# Patient Record
Sex: Male | Born: 1945
Health system: Southern US, Community
[De-identification: ages and names within clinical notes are randomized; demographics above are authoritative.]

## PROBLEM LIST (undated history)

## (undated) DIAGNOSIS — E039 Hypothyroidism, unspecified: Secondary | ICD-10-CM

## (undated) DIAGNOSIS — C801 Malignant (primary) neoplasm, unspecified: Secondary | ICD-10-CM

## (undated) DIAGNOSIS — I10 Essential (primary) hypertension: Secondary | ICD-10-CM

## (undated) DIAGNOSIS — Z8669 Personal history of other diseases of the nervous system and sense organs: Secondary | ICD-10-CM

## (undated) DIAGNOSIS — I472 Ventricular tachycardia, unspecified: Secondary | ICD-10-CM

## (undated) DIAGNOSIS — E785 Hyperlipidemia, unspecified: Secondary | ICD-10-CM

## (undated) DIAGNOSIS — Z8639 Personal history of other endocrine, nutritional and metabolic disease: Secondary | ICD-10-CM

## (undated) DIAGNOSIS — J302 Other seasonal allergic rhinitis: Secondary | ICD-10-CM

## (undated) DIAGNOSIS — E669 Obesity, unspecified: Secondary | ICD-10-CM

## (undated) DIAGNOSIS — Z8619 Personal history of other infectious and parasitic diseases: Secondary | ICD-10-CM

## (undated) HISTORY — DX: Hyperlipidemia, unspecified: E78.5

## (undated) HISTORY — DX: Personal history of other diseases of the nervous system and sense organs: Z86.69

## (undated) HISTORY — DX: Obesity, unspecified: E66.9

## (undated) HISTORY — DX: Hypothyroidism, unspecified: E03.9

## (undated) HISTORY — DX: Personal history of other infectious and parasitic diseases: Z86.19

## (undated) HISTORY — DX: Personal history of other endocrine, nutritional and metabolic disease: Z86.39

## (undated) HISTORY — DX: Essential (primary) hypertension: I10

## (undated) HISTORY — PX: CATARACT EXTRACTION: SUR2

## (undated) HISTORY — DX: Other seasonal allergic rhinitis: J30.2

## (undated) SURGERY — Surgical Case
Anesthesia: *Unknown

---
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
## 1967-11-19 HISTORY — PX: TONSILLECTOMY AND ADENOIDECTOMY: SUR1326

## 2006-07-01 ENCOUNTER — Emergency Department: Payer: Self-pay | Admitting: Emergency Medicine

## 2006-07-02 ENCOUNTER — Other Ambulatory Visit: Payer: Self-pay

## 2011-10-29 ENCOUNTER — Encounter: Payer: Self-pay | Admitting: Family Medicine

## 2011-10-29 ENCOUNTER — Ambulatory Visit (INDEPENDENT_AMBULATORY_CARE_PROVIDER_SITE_OTHER): Payer: Medicare Other | Admitting: Family Medicine

## 2011-10-29 VITALS — BP 138/80 | HR 92 | Temp 98.4°F | Ht 69.25 in | Wt 262.5 lb

## 2011-10-29 DIAGNOSIS — Z23 Encounter for immunization: Secondary | ICD-10-CM

## 2011-10-29 DIAGNOSIS — E039 Hypothyroidism, unspecified: Secondary | ICD-10-CM | POA: Insufficient documentation

## 2011-10-29 DIAGNOSIS — I10 Essential (primary) hypertension: Secondary | ICD-10-CM | POA: Insufficient documentation

## 2011-10-29 DIAGNOSIS — E669 Obesity, unspecified: Secondary | ICD-10-CM | POA: Insufficient documentation

## 2011-10-29 DIAGNOSIS — L989 Disorder of the skin and subcutaneous tissue, unspecified: Secondary | ICD-10-CM

## 2011-10-29 NOTE — Assessment & Plan Note (Signed)
Recommend increase activity- restart daily 1 mi walks with dog. Body mass index is 38.49 kg/(m^2).

## 2011-10-29 NOTE — Assessment & Plan Note (Signed)
SK's - benign.  Reassured.

## 2011-10-29 NOTE — Assessment & Plan Note (Signed)
Chronic. Stable. Somewhat elevated today, new office and MD, ?due to this. Recheck next visit. Compliant with meds. Request records/latest blood work from prior PCP.

## 2011-10-29 NOTE — Patient Instructions (Signed)
Flu shot today. Increase exercise - activity daily. Return at your convenience for welcome to medicare visit and fasting blood work. Good to see you today, call us with questions.
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
## 2011-10-29 NOTE — Assessment & Plan Note (Signed)
Followed by endo. Request records from prior pcp and endo.

## 2011-12-05 ENCOUNTER — Telehealth: Payer: Self-pay

## 2011-12-05 MED ORDER — LISINOPRIL 10 MG PO TABS: 10.0000 mg | ORAL_TABLET | Freq: Every day | ORAL | Status: DC

## 2011-12-05 MED ORDER — HYDROCHLOROTHIAZIDE 25 MG PO TABS: 25.0000 mg | ORAL_TABLET | Freq: Every day | ORAL | Status: DC

## 2011-12-05 NOTE — Telephone Encounter (Signed)
No. Ok to refill.  Have sent in.  plz notify 1 mo supply sent to [facility].  plz change if pt desires 3 mo supply instead.Marland Kitchen

## 2011-12-05 NOTE — Telephone Encounter (Signed)
Pt first saw Dr Sharen Hones 10/29/11. Pt also sees endocrinologist and has lab results from endocrinologist that pt will bring to next visit. Pt has 5 more days of HCTZ 25mg  and Lisinopril 10 mg.Pt wants to know if he needs to see Dr Reece Agar before he refills these meds or not. Pt uses [facility] [address] if pharmacy needed and pt can be reached at [phone].Please advise.

## 2011-12-05 NOTE — Telephone Encounter (Signed)
Patient notified. He will be faxing lab results from endo to your attn.

## 2012-04-15 ENCOUNTER — Ambulatory Visit: Payer: Self-pay | Admitting: Ophthalmology

## 2012-05-27 ENCOUNTER — Ambulatory Visit: Payer: Self-pay | Admitting: Ophthalmology

## 2012-07-09 ENCOUNTER — Other Ambulatory Visit: Payer: Self-pay | Admitting: Family Medicine

## 2012-08-11 ENCOUNTER — Ambulatory Visit (INDEPENDENT_AMBULATORY_CARE_PROVIDER_SITE_OTHER): Payer: Medicare Other | Admitting: Family Medicine

## 2012-08-11 ENCOUNTER — Encounter: Payer: Self-pay | Admitting: Family Medicine

## 2012-08-11 VITALS — BP 156/90 | HR 64 | Temp 98.3°F | Resp 20 | Ht 69.25 in | Wt 268.8 lb

## 2012-08-11 DIAGNOSIS — E669 Obesity, unspecified: Secondary | ICD-10-CM

## 2012-08-11 DIAGNOSIS — E039 Hypothyroidism, unspecified: Secondary | ICD-10-CM

## 2012-08-11 DIAGNOSIS — Z23 Encounter for immunization: Secondary | ICD-10-CM

## 2012-08-11 DIAGNOSIS — I10 Essential (primary) hypertension: Secondary | ICD-10-CM

## 2012-08-11 MED ORDER — LISINOPRIL 10 MG PO TABS: 10.0000 mg | ORAL_TABLET | Freq: Every day | ORAL | Status: DC

## 2012-08-11 MED ORDER — HYDROCHLOROTHIAZIDE 25 MG PO TABS: 25.0000 mg | ORAL_TABLET | Freq: Every day | ORAL | Status: DC

## 2012-08-11 MED ORDER — ZOSTER VACCINE LIVE 19400 UNT/0.65ML ~~LOC~~ SOLR: 0.6500 mL | Freq: Once | SUBCUTANEOUS | Status: DC

## 2012-08-11 NOTE — Addendum Note (Signed)
Addended by: Patience Musca on: 08/11/2012 01:23 PM   Modules accepted: Orders

## 2012-08-11 NOTE — Assessment & Plan Note (Signed)
BP Readings from Last 3 Encounters:  08/11/12 156/90  10/29/11 138/80  chronic, stable when on meds.  Refilled 1 year supply.

## 2012-08-11 NOTE — Patient Instructions (Addendum)
meds refilled today. Flu shot today.  Pneumonia shot today. Shingles shot script provided today.  Wait at least 1 month between pneumonia shot and shingles shot. Sign release of information for blood work and records from Dr. Chestine Spore. Return at your convenience fasting for blood work to check kidneys, cholesterol and sugar. Return at your convenience for medicare wellness visit.
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
## 2012-08-11 NOTE — Assessment & Plan Note (Signed)
Chronic, followed by endo. Request records from [place] (ROI signed today). Goal TSH 1.0.

## 2012-08-18 ENCOUNTER — Other Ambulatory Visit (INDEPENDENT_AMBULATORY_CARE_PROVIDER_SITE_OTHER): Payer: Medicare Other

## 2012-08-18 DIAGNOSIS — E669 Obesity, unspecified: Secondary | ICD-10-CM

## 2012-08-18 DIAGNOSIS — I10 Essential (primary) hypertension: Secondary | ICD-10-CM

## 2012-08-18 LAB — LIPID PANEL
HDL: 35.5 mg/dL — ABNORMAL LOW (ref >39.00)
Triglycerides: 301 mg/dL — ABNORMAL HIGH (ref 0.0–149.0)

## 2012-08-18 LAB — BASIC METABOLIC PANEL
BUN: 19 mg/dL (ref 6–23)
CO2: 28 mEq/L (ref 19–32)
Calcium: 9.1 mg/dL (ref 8.4–10.5)
Glucose, Bld: 110 mg/dL — ABNORMAL HIGH (ref 70–99)
Potassium: 4.5 mEq/L (ref 3.5–5.1)
Sodium: 135 mEq/L (ref 135–145)

## 2012-08-19 ENCOUNTER — Encounter: Payer: Self-pay | Admitting: Family Medicine

## 2012-08-19 DIAGNOSIS — E785 Hyperlipidemia, unspecified: Secondary | ICD-10-CM | POA: Insufficient documentation

## 2012-11-03 ENCOUNTER — Other Ambulatory Visit: Payer: Self-pay | Admitting: Internal Medicine

## 2012-11-03 DIAGNOSIS — N644 Mastodynia: Secondary | ICD-10-CM

## 2012-11-23 ENCOUNTER — Ambulatory Visit
Admission: RE | Admit: 2012-11-23 | Discharge: 2012-11-23 | Disposition: A | Discharge status: Home / Self Care | Payer: Medicare Other | Source: Ambulatory Visit | Attending: Internal Medicine | Admitting: Internal Medicine

## 2012-11-23 ENCOUNTER — Other Ambulatory Visit: Payer: Self-pay | Admitting: Internal Medicine

## 2012-11-23 DIAGNOSIS — N644 Mastodynia: Secondary | ICD-10-CM

## 2013-09-23 ENCOUNTER — Other Ambulatory Visit: Payer: Self-pay

## 2014-11-08 DIAGNOSIS — I10 Essential (primary) hypertension: Secondary | ICD-10-CM | POA: Insufficient documentation

## 2014-11-08 DIAGNOSIS — R7989 Other specified abnormal findings of blood chemistry: Secondary | ICD-10-CM | POA: Insufficient documentation

## 2015-06-06 DIAGNOSIS — M1710 Unilateral primary osteoarthritis, unspecified knee: Secondary | ICD-10-CM | POA: Insufficient documentation

## 2015-06-06 DIAGNOSIS — M179 Osteoarthritis of knee, unspecified: Secondary | ICD-10-CM | POA: Insufficient documentation

## 2015-12-06 DIAGNOSIS — E039 Hypothyroidism, unspecified: Secondary | ICD-10-CM | POA: Diagnosis not present

## 2015-12-06 DIAGNOSIS — E291 Testicular hypofunction: Secondary | ICD-10-CM | POA: Diagnosis not present

## 2016-04-04 DIAGNOSIS — E039 Hypothyroidism, unspecified: Secondary | ICD-10-CM | POA: Diagnosis not present

## 2016-04-04 DIAGNOSIS — E291 Testicular hypofunction: Secondary | ICD-10-CM | POA: Diagnosis not present

## 2016-04-25 DIAGNOSIS — Z1211 Encounter for screening for malignant neoplasm of colon: Secondary | ICD-10-CM | POA: Diagnosis not present

## 2016-05-06 DIAGNOSIS — Z79899 Other long term (current) drug therapy: Secondary | ICD-10-CM | POA: Diagnosis not present

## 2016-05-06 DIAGNOSIS — I10 Essential (primary) hypertension: Secondary | ICD-10-CM | POA: Diagnosis not present

## 2016-05-13 DIAGNOSIS — Z125 Encounter for screening for malignant neoplasm of prostate: Secondary | ICD-10-CM | POA: Diagnosis not present

## 2016-05-13 DIAGNOSIS — E039 Hypothyroidism, unspecified: Secondary | ICD-10-CM | POA: Diagnosis not present

## 2016-05-13 DIAGNOSIS — I10 Essential (primary) hypertension: Secondary | ICD-10-CM | POA: Diagnosis not present

## 2016-05-13 DIAGNOSIS — Z79899 Other long term (current) drug therapy: Secondary | ICD-10-CM | POA: Diagnosis not present

## 2016-05-13 DIAGNOSIS — E291 Testicular hypofunction: Secondary | ICD-10-CM | POA: Diagnosis not present

## 2016-05-13 DIAGNOSIS — R739 Hyperglycemia, unspecified: Secondary | ICD-10-CM | POA: Diagnosis not present

## 2016-07-25 DIAGNOSIS — E039 Hypothyroidism, unspecified: Secondary | ICD-10-CM | POA: Diagnosis not present

## 2016-07-25 DIAGNOSIS — E291 Testicular hypofunction: Secondary | ICD-10-CM | POA: Diagnosis not present

## 2016-07-25 DIAGNOSIS — E230 Hypopituitarism: Secondary | ICD-10-CM | POA: Diagnosis not present

## 2016-07-25 DIAGNOSIS — R413 Other amnesia: Secondary | ICD-10-CM | POA: Diagnosis not present

## 2016-08-28 DIAGNOSIS — E291 Testicular hypofunction: Secondary | ICD-10-CM | POA: Diagnosis not present

## 2016-09-11 DIAGNOSIS — E349 Endocrine disorder, unspecified: Secondary | ICD-10-CM | POA: Diagnosis not present

## 2016-09-25 DIAGNOSIS — E349 Endocrine disorder, unspecified: Secondary | ICD-10-CM | POA: Diagnosis not present

## 2016-09-26 DIAGNOSIS — E291 Testicular hypofunction: Secondary | ICD-10-CM | POA: Diagnosis not present

## 2016-09-26 DIAGNOSIS — E039 Hypothyroidism, unspecified: Secondary | ICD-10-CM | POA: Diagnosis not present

## 2016-10-09 DIAGNOSIS — E349 Endocrine disorder, unspecified: Secondary | ICD-10-CM | POA: Diagnosis not present

## 2016-10-23 DIAGNOSIS — E349 Endocrine disorder, unspecified: Secondary | ICD-10-CM | POA: Diagnosis not present

## 2016-11-06 DIAGNOSIS — E349 Endocrine disorder, unspecified: Secondary | ICD-10-CM | POA: Diagnosis not present

## 2016-11-20 DIAGNOSIS — E349 Endocrine disorder, unspecified: Secondary | ICD-10-CM | POA: Diagnosis not present

## 2016-11-27 DIAGNOSIS — Z125 Encounter for screening for malignant neoplasm of prostate: Secondary | ICD-10-CM | POA: Diagnosis not present

## 2016-11-27 DIAGNOSIS — I10 Essential (primary) hypertension: Secondary | ICD-10-CM | POA: Diagnosis not present

## 2016-11-27 DIAGNOSIS — E349 Endocrine disorder, unspecified: Secondary | ICD-10-CM | POA: Diagnosis not present

## 2016-11-27 DIAGNOSIS — Z79899 Other long term (current) drug therapy: Secondary | ICD-10-CM | POA: Diagnosis not present

## 2016-11-27 DIAGNOSIS — R739 Hyperglycemia, unspecified: Secondary | ICD-10-CM | POA: Diagnosis not present

## 2016-12-02 DIAGNOSIS — E291 Testicular hypofunction: Secondary | ICD-10-CM | POA: Diagnosis not present

## 2016-12-02 DIAGNOSIS — Z23 Encounter for immunization: Secondary | ICD-10-CM | POA: Diagnosis not present

## 2016-12-02 DIAGNOSIS — Z0000 Encounter for general adult medical examination without abnormal findings: Secondary | ICD-10-CM | POA: Diagnosis not present

## 2017-05-26 DIAGNOSIS — Z79899 Other long term (current) drug therapy: Secondary | ICD-10-CM | POA: Diagnosis not present

## 2017-05-26 DIAGNOSIS — E039 Hypothyroidism, unspecified: Secondary | ICD-10-CM | POA: Diagnosis not present

## 2017-05-26 DIAGNOSIS — R7989 Other specified abnormal findings of blood chemistry: Secondary | ICD-10-CM | POA: Diagnosis not present

## 2017-05-26 DIAGNOSIS — R739 Hyperglycemia, unspecified: Secondary | ICD-10-CM | POA: Diagnosis not present

## 2017-05-26 DIAGNOSIS — I10 Essential (primary) hypertension: Secondary | ICD-10-CM | POA: Diagnosis not present

## 2017-06-02 DIAGNOSIS — I10 Essential (primary) hypertension: Secondary | ICD-10-CM | POA: Diagnosis not present

## 2017-06-02 DIAGNOSIS — R7989 Other specified abnormal findings of blood chemistry: Secondary | ICD-10-CM | POA: Diagnosis not present

## 2017-06-02 DIAGNOSIS — R7301 Impaired fasting glucose: Secondary | ICD-10-CM | POA: Diagnosis not present

## 2017-06-02 DIAGNOSIS — E039 Hypothyroidism, unspecified: Secondary | ICD-10-CM | POA: Diagnosis not present

## 2017-06-02 DIAGNOSIS — Z79899 Other long term (current) drug therapy: Secondary | ICD-10-CM | POA: Diagnosis not present

## 2017-07-25 DIAGNOSIS — X32XXXA Exposure to sunlight, initial encounter: Secondary | ICD-10-CM | POA: Diagnosis not present

## 2017-07-25 DIAGNOSIS — D225 Melanocytic nevi of trunk: Secondary | ICD-10-CM | POA: Diagnosis not present

## 2017-07-25 DIAGNOSIS — D485 Neoplasm of uncertain behavior of skin: Secondary | ICD-10-CM | POA: Diagnosis not present

## 2017-07-25 DIAGNOSIS — L821 Other seborrheic keratosis: Secondary | ICD-10-CM | POA: Diagnosis not present

## 2017-07-25 DIAGNOSIS — D0439 Carcinoma in situ of skin of other parts of face: Secondary | ICD-10-CM | POA: Diagnosis not present

## 2017-07-25 DIAGNOSIS — D0462 Carcinoma in situ of skin of left upper limb, including shoulder: Secondary | ICD-10-CM | POA: Diagnosis not present

## 2017-07-25 DIAGNOSIS — L570 Actinic keratosis: Secondary | ICD-10-CM | POA: Diagnosis not present

## 2017-08-04 DIAGNOSIS — D0462 Carcinoma in situ of skin of left upper limb, including shoulder: Secondary | ICD-10-CM | POA: Diagnosis not present

## 2017-08-06 DIAGNOSIS — C44329 Squamous cell carcinoma of skin of other parts of face: Secondary | ICD-10-CM | POA: Diagnosis not present

## 2017-11-27 DIAGNOSIS — R7301 Impaired fasting glucose: Secondary | ICD-10-CM | POA: Diagnosis not present

## 2017-11-27 DIAGNOSIS — I10 Essential (primary) hypertension: Secondary | ICD-10-CM | POA: Diagnosis not present

## 2017-11-27 DIAGNOSIS — E039 Hypothyroidism, unspecified: Secondary | ICD-10-CM | POA: Diagnosis not present

## 2017-11-27 DIAGNOSIS — Z79899 Other long term (current) drug therapy: Secondary | ICD-10-CM | POA: Diagnosis not present

## 2017-12-04 DIAGNOSIS — Z1211 Encounter for screening for malignant neoplasm of colon: Secondary | ICD-10-CM | POA: Diagnosis not present

## 2017-12-04 DIAGNOSIS — E119 Type 2 diabetes mellitus without complications: Secondary | ICD-10-CM | POA: Diagnosis not present

## 2017-12-04 DIAGNOSIS — Z125 Encounter for screening for malignant neoplasm of prostate: Secondary | ICD-10-CM | POA: Diagnosis not present

## 2017-12-04 DIAGNOSIS — I10 Essential (primary) hypertension: Secondary | ICD-10-CM | POA: Diagnosis not present

## 2017-12-04 DIAGNOSIS — R7989 Other specified abnormal findings of blood chemistry: Secondary | ICD-10-CM | POA: Diagnosis not present

## 2017-12-04 DIAGNOSIS — Z0000 Encounter for general adult medical examination without abnormal findings: Secondary | ICD-10-CM | POA: Diagnosis not present

## 2017-12-04 DIAGNOSIS — E039 Hypothyroidism, unspecified: Secondary | ICD-10-CM | POA: Diagnosis not present

## 2017-12-04 DIAGNOSIS — Z79899 Other long term (current) drug therapy: Secondary | ICD-10-CM | POA: Diagnosis not present

## 2017-12-11 DIAGNOSIS — Z85828 Personal history of other malignant neoplasm of skin: Secondary | ICD-10-CM | POA: Diagnosis not present

## 2017-12-11 DIAGNOSIS — L538 Other specified erythematous conditions: Secondary | ICD-10-CM | POA: Diagnosis not present

## 2017-12-11 DIAGNOSIS — Z08 Encounter for follow-up examination after completed treatment for malignant neoplasm: Secondary | ICD-10-CM | POA: Diagnosis not present

## 2017-12-11 DIAGNOSIS — L918 Other hypertrophic disorders of the skin: Secondary | ICD-10-CM | POA: Diagnosis not present

## 2017-12-11 DIAGNOSIS — R208 Other disturbances of skin sensation: Secondary | ICD-10-CM | POA: Diagnosis not present

## 2017-12-11 DIAGNOSIS — L570 Actinic keratosis: Secondary | ICD-10-CM | POA: Diagnosis not present

## 2017-12-11 DIAGNOSIS — L821 Other seborrheic keratosis: Secondary | ICD-10-CM | POA: Diagnosis not present

## 2017-12-11 DIAGNOSIS — L728 Other follicular cysts of the skin and subcutaneous tissue: Secondary | ICD-10-CM | POA: Diagnosis not present

## 2017-12-11 DIAGNOSIS — X32XXXA Exposure to sunlight, initial encounter: Secondary | ICD-10-CM | POA: Diagnosis not present

## 2018-04-03 ENCOUNTER — Encounter: Payer: Self-pay | Admitting: *Deleted

## 2018-04-03 ENCOUNTER — Other Ambulatory Visit: Payer: Self-pay

## 2018-04-03 ENCOUNTER — Inpatient Hospital Stay
Admission: EM | Admit: 2018-04-03 | Discharge: 2018-04-06 | DRG: 871 | Disposition: A | Discharge status: Home / Self Care | Payer: PPO | Attending: Internal Medicine | Admitting: Internal Medicine

## 2018-04-03 ENCOUNTER — Emergency Department: Payer: PPO

## 2018-04-03 DIAGNOSIS — I10 Essential (primary) hypertension: Secondary | ICD-10-CM | POA: Diagnosis not present

## 2018-04-03 DIAGNOSIS — M7989 Other specified soft tissue disorders: Secondary | ICD-10-CM | POA: Diagnosis not present

## 2018-04-03 DIAGNOSIS — R509 Fever, unspecified: Secondary | ICD-10-CM | POA: Diagnosis not present

## 2018-04-03 DIAGNOSIS — Z6839 Body mass index (BMI) 39.0-39.9, adult: Secondary | ICD-10-CM

## 2018-04-03 DIAGNOSIS — G4733 Obstructive sleep apnea (adult) (pediatric): Secondary | ICD-10-CM | POA: Diagnosis not present

## 2018-04-03 DIAGNOSIS — E039 Hypothyroidism, unspecified: Secondary | ICD-10-CM | POA: Diagnosis not present

## 2018-04-03 DIAGNOSIS — R740 Nonspecific elevation of levels of transaminase and lactic acid dehydrogenase [LDH]: Secondary | ICD-10-CM | POA: Diagnosis present

## 2018-04-03 DIAGNOSIS — J154 Pneumonia due to other streptococci: Secondary | ICD-10-CM | POA: Diagnosis not present

## 2018-04-03 DIAGNOSIS — A419 Sepsis, unspecified organism: Secondary | ICD-10-CM | POA: Diagnosis not present

## 2018-04-03 DIAGNOSIS — R609 Edema, unspecified: Secondary | ICD-10-CM

## 2018-04-03 DIAGNOSIS — R000 Tachycardia, unspecified: Secondary | ICD-10-CM | POA: Diagnosis present

## 2018-04-03 DIAGNOSIS — L03116 Cellulitis of left lower limb: Secondary | ICD-10-CM | POA: Diagnosis present

## 2018-04-03 DIAGNOSIS — D751 Secondary polycythemia: Secondary | ICD-10-CM | POA: Diagnosis present

## 2018-04-03 DIAGNOSIS — R0602 Shortness of breath: Secondary | ICD-10-CM | POA: Diagnosis not present

## 2018-04-03 DIAGNOSIS — A409 Streptococcal sepsis, unspecified: Principal | ICD-10-CM | POA: Diagnosis present

## 2018-04-03 DIAGNOSIS — J189 Pneumonia, unspecified organism: Secondary | ICD-10-CM | POA: Diagnosis not present

## 2018-04-03 DIAGNOSIS — E860 Dehydration: Secondary | ICD-10-CM | POA: Diagnosis not present

## 2018-04-03 DIAGNOSIS — Z880 Allergy status to penicillin: Secondary | ICD-10-CM

## 2018-04-03 DIAGNOSIS — E291 Testicular hypofunction: Secondary | ICD-10-CM | POA: Diagnosis present

## 2018-04-03 DIAGNOSIS — E785 Hyperlipidemia, unspecified: Secondary | ICD-10-CM | POA: Diagnosis present

## 2018-04-03 DIAGNOSIS — Z79899 Other long term (current) drug therapy: Secondary | ICD-10-CM | POA: Diagnosis not present

## 2018-04-03 DIAGNOSIS — D72829 Elevated white blood cell count, unspecified: Secondary | ICD-10-CM | POA: Diagnosis not present

## 2018-04-03 DIAGNOSIS — Z79890 Hormone replacement therapy (postmenopausal): Secondary | ICD-10-CM | POA: Diagnosis not present

## 2018-04-03 LAB — CBC WITH DIFFERENTIAL/PLATELET
Basophils Absolute: 0 10*3/uL (ref 0–0.1)
Basophils Relative: 0 %
Eosinophils Absolute: 0.1 10*3/uL (ref 0–0.7)
Eosinophils Relative: 1 %
HEMATOCRIT: 60 % — AB (ref 40.0–52.0)
HEMOGLOBIN: 19.8 g/dL — AB (ref 13.0–18.0)
LYMPHS ABS: 1.6 10*3/uL (ref 1.0–3.6)
LYMPHS PCT: 11 %
MCH: 29.9 pg (ref 26.0–34.0)
MCHC: 33.1 g/dL (ref 32.0–36.0)
MCV: 90.3 fL (ref 80.0–100.0)
MONOS PCT: 2 %
Monocytes Absolute: 0.3 10*3/uL (ref 0.2–1.0)
NEUTROS ABS: 13 10*3/uL — AB (ref 1.4–6.5)
NEUTROS PCT: 86 %
Platelets: 211 10*3/uL (ref 150–440)
RBC: 6.64 MIL/uL — ABNORMAL HIGH (ref 4.40–5.90)
RDW: 15.2 % — ABNORMAL HIGH (ref 11.5–14.5)
WBC: 15 10*3/uL — ABNORMAL HIGH (ref 3.8–10.6)

## 2018-04-03 LAB — URINALYSIS, ROUTINE W REFLEX MICROSCOPIC
Bacteria, UA: NONE SEEN
Bilirubin Urine: NEGATIVE
GLUCOSE, UA: NEGATIVE mg/dL
KETONES UR: NEGATIVE mg/dL
Leukocytes, UA: NEGATIVE
Nitrite: NEGATIVE
PH: 5 (ref 5.0–8.0)
Protein, ur: NEGATIVE mg/dL
SPECIFIC GRAVITY, URINE: 1.011 (ref 1.005–1.030)

## 2018-04-03 LAB — LACTIC ACID, PLASMA
LACTIC ACID, VENOUS: 3.4 mmol/L — AB (ref 0.5–1.9)
LACTIC ACID, VENOUS: 2 mmol/L — AB (ref 0.5–1.9)
LACTIC ACID, VENOUS: 2.9 mmol/L — AB (ref 0.5–1.9)
LACTIC ACID, VENOUS: 2.5 mmol/L — AB (ref 0.5–1.9)

## 2018-04-03 LAB — BLOOD CULTURE ID PANEL (REFLEXED)
Acinetobacter baumannii: NOT DETECTED
Candida albicans: NOT DETECTED
Candida glabrata: NOT DETECTED
Candida krusei: NOT DETECTED
Candida parapsilosis: NOT DETECTED
Candida tropicalis: NOT DETECTED
ENTEROBACTER CLOACAE COMPLEX: NOT DETECTED
ENTEROCOCCUS SPECIES: NOT DETECTED
Enterobacteriaceae species: NOT DETECTED
Escherichia coli: NOT DETECTED
Haemophilus influenzae: NOT DETECTED
Klebsiella oxytoca: NOT DETECTED
Klebsiella pneumoniae: NOT DETECTED
LISTERIA MONOCYTOGENES: NOT DETECTED
NEISSERIA MENINGITIDIS: NOT DETECTED
PROTEUS SPECIES: NOT DETECTED
PSEUDOMONAS AERUGINOSA: NOT DETECTED
SERRATIA MARCESCENS: NOT DETECTED
STAPHYLOCOCCUS AUREUS BCID: NOT DETECTED
STAPHYLOCOCCUS SPECIES: NOT DETECTED
STREPTOCOCCUS AGALACTIAE: DETECTED — AB
STREPTOCOCCUS PNEUMONIAE: NOT DETECTED
STREPTOCOCCUS PYOGENES: NOT DETECTED
STREPTOCOCCUS SPECIES: DETECTED — AB

## 2018-04-03 LAB — BLOOD GAS, VENOUS
Acid-base deficit: 1.3 mmol/L (ref 0.0–2.0)
BICARBONATE: 26.2 mmol/L (ref 20.0–28.0)
O2 Saturation: 58.8 %
PCO2 VEN: 52 mmHg (ref 44.0–60.0)
PH VEN: 7.31 (ref 7.250–7.430)
PO2 VEN: 34 mmHg (ref 32.0–45.0)
Patient temperature: 37

## 2018-04-03 LAB — COMPREHENSIVE METABOLIC PANEL
ALBUMIN: 3.9 g/dL (ref 3.5–5.0)
ALT: 19 U/L (ref 17–63)
AST: 25 U/L (ref 15–41)
Alkaline Phosphatase: 70 U/L (ref 38–126)
Anion gap: 11 (ref 5–15)
BUN: 15 mg/dL (ref 6–20)
CHLORIDE: 101 mmol/L (ref 101–111)
CO2: 23 mmol/L (ref 22–32)
Calcium: 8.6 mg/dL — ABNORMAL LOW (ref 8.9–10.3)
Creatinine, Ser: 1.05 mg/dL (ref 0.61–1.24)
GFR calc Af Amer: >60 mL/min (ref >60)
GFR calc non Af Amer: >60 mL/min (ref >60)
GLUCOSE: 104 mg/dL — AB (ref 65–99)
POTASSIUM: 4.3 mmol/L (ref 3.5–5.1)
Sodium: 135 mmol/L (ref 135–145)
Total Bilirubin: 0.9 mg/dL (ref 0.3–1.2)
Total Protein: 7 g/dL (ref 6.5–8.1)

## 2018-04-03 LAB — MRSA PCR SCREENING: MRSA by PCR: NEGATIVE

## 2018-04-03 LAB — LIPASE, BLOOD: Lipase: 31 U/L (ref 11–51)

## 2018-04-03 LAB — PROTIME-INR
INR: 1
Prothrombin Time: 13.1 seconds (ref 11.4–15.2)

## 2018-04-03 LAB — TSH: TSH: 0.295 u[IU]/mL — ABNORMAL LOW (ref 0.350–4.500)

## 2018-04-03 LAB — TROPONIN I: Troponin I: <0.03 ng/mL (ref <0.03)

## 2018-04-03 LAB — T4, FREE: Free T4: 1.6 ng/dL (ref 0.82–1.77)

## 2018-04-03 LAB — PROCALCITONIN: PROCALCITONIN: 0.15 ng/mL

## 2018-04-03 LAB — BRAIN NATRIURETIC PEPTIDE: B Natriuretic Peptide: 30 pg/mL (ref 0.0–100.0)

## 2018-04-03 MED ORDER — LISINOPRIL 10 MG PO TABS
10.0000 mg | ORAL_TABLET | Freq: Every day | ORAL | Status: DC
  Administered 2018-04-03 – 2018-04-06 (×4): 10 mg via ORAL
  Filled 2018-04-03 (×4): qty 1

## 2018-04-03 MED ORDER — ACETAMINOPHEN 325 MG PO TABS
650.0000 mg | ORAL_TABLET | Freq: Four times a day (QID) | ORAL | Status: DC | PRN
  Administered 2018-04-03 – 2018-04-06 (×6): 650 mg via ORAL
  Filled 2018-04-03 (×6): qty 2

## 2018-04-03 MED ORDER — ENOXAPARIN SODIUM 40 MG/0.4ML ~~LOC~~ SOLN
40.0000 mg | SUBCUTANEOUS | Status: DC
Start: 2018-04-03 — End: 2018-04-06
  Administered 2018-04-03 – 2018-04-05 (×3): 40 mg via SUBCUTANEOUS
  Filled 2018-04-03 (×3): qty 0.4

## 2018-04-03 MED ORDER — FENTANYL 2500MCG IN NS 250ML (10MCG/ML) PREMIX INFUSION
100.0000 ug/h | INTRAVENOUS | Status: DC
  Filled 2018-04-03: qty 250

## 2018-04-03 MED ORDER — SUCCINYLCHOLINE CHLORIDE 20 MG/ML IJ SOLN: 200.0000 mg | Freq: Once | INTRAMUSCULAR | Status: DC

## 2018-04-03 MED ORDER — SODIUM CHLORIDE 0.9 % IV BOLUS
1000.0000 mL | Freq: Once | INTRAVENOUS | Status: AC
Start: 2018-04-03 — End: 2018-04-03
  Administered 2018-04-03: 1000 mL via INTRAVENOUS

## 2018-04-03 MED ORDER — VITAMIN D 1000 UNITS PO TABS
1000.0000 [IU] | ORAL_TABLET | Freq: Every day | ORAL | Status: DC
  Administered 2018-04-03 – 2018-04-06 (×4): 1000 [IU] via ORAL
  Filled 2018-04-03 (×4): qty 1

## 2018-04-03 MED ORDER — ONDANSETRON HCL 4 MG/2ML IJ SOLN: 4.0000 mg | Freq: Four times a day (QID) | INTRAMUSCULAR | Status: DC | PRN

## 2018-04-03 MED ORDER — ADULT MULTIVITAMIN W/MINERALS CH
1.0000 | ORAL_TABLET | Freq: Every day | ORAL | Status: DC
  Administered 2018-04-03 – 2018-04-06 (×4): 1 via ORAL
  Filled 2018-04-03 (×4): qty 1

## 2018-04-03 MED ORDER — KETAMINE HCL 10 MG/ML IJ SOLN: 200.0000 mg | Freq: Once | INTRAMUSCULAR | Status: DC

## 2018-04-03 MED ORDER — AZITHROMYCIN 500 MG PO TABS: 250.0000 mg | ORAL_TABLET | Freq: Every day | ORAL | Status: DC

## 2018-04-03 MED ORDER — KETOROLAC TROMETHAMINE 30 MG/ML IJ SOLN
15.0000 mg | Freq: Once | INTRAMUSCULAR | Status: AC
  Administered 2018-04-03: 15 mg via INTRAVENOUS
  Filled 2018-04-03: qty 1

## 2018-04-03 MED ORDER — SODIUM CHLORIDE 0.9 % IV SOLN
1.0000 g | INTRAVENOUS | Status: DC
  Administered 2018-04-03: 10:00:00 1 g via INTRAVENOUS
  Filled 2018-04-03: qty 1
  Filled 2018-04-03: qty 10

## 2018-04-03 MED ORDER — SENNA 8.6 MG PO TABS
1.0000 | ORAL_TABLET | Freq: Two times a day (BID) | ORAL | Status: DC
  Administered 2018-04-03 – 2018-04-05 (×5): 8.6 mg via ORAL
  Filled 2018-04-03 (×7): qty 1

## 2018-04-03 MED ORDER — LEVOTHYROXINE SODIUM 100 MCG PO TABS
100.0000 ug | ORAL_TABLET | Freq: Every day | ORAL | Status: DC
  Administered 2018-04-03: 100 ug via ORAL
  Filled 2018-04-03: qty 1

## 2018-04-03 MED ORDER — PROPOFOL 1000 MG/100ML IV EMUL: 5.0000 ug/kg/min | Freq: Once | INTRAVENOUS | Status: DC

## 2018-04-03 MED ORDER — LEVOTHYROXINE SODIUM 150 MCG PO TABS
150.0000 ug | ORAL_TABLET | Freq: Every day | ORAL | Status: DC
  Administered 2018-04-04 – 2018-04-06 (×3): 150 ug via ORAL
  Filled 2018-04-03 (×2): qty 3
  Filled 2018-04-03 (×3): qty 1

## 2018-04-03 MED ORDER — VANCOMYCIN HCL 10 G IV SOLR
1500.0000 mg | Freq: Once | INTRAVENOUS | Status: AC
  Administered 2018-04-03: 1500 mg via INTRAVENOUS
  Filled 2018-04-03: qty 1500

## 2018-04-03 MED ORDER — ALBUTEROL SULFATE (2.5 MG/3ML) 0.083% IN NEBU
2.5000 mg | INHALATION_SOLUTION | Freq: Four times a day (QID) | RESPIRATORY_TRACT | Status: DC | PRN
  Administered 2018-04-03 (×2): 2.5 mg via RESPIRATORY_TRACT
  Filled 2018-04-03 (×2): qty 3

## 2018-04-03 MED ORDER — AZITHROMYCIN 500 MG PO TABS
500.0000 mg | ORAL_TABLET | Freq: Every day | ORAL | Status: AC
  Administered 2018-04-03: 500 mg via ORAL
  Filled 2018-04-03: qty 1

## 2018-04-03 MED ORDER — ONDANSETRON HCL 4 MG PO TABS: 4.0000 mg | ORAL_TABLET | Freq: Four times a day (QID) | ORAL | Status: DC | PRN

## 2018-04-03 MED ORDER — POLYETHYLENE GLYCOL 3350 17 G PO PACK: 17.0000 g | PACK | Freq: Every day | ORAL | Status: DC | PRN

## 2018-04-03 MED ORDER — HYDROCHLOROTHIAZIDE 25 MG PO TABS
25.0000 mg | ORAL_TABLET | Freq: Every day | ORAL | Status: DC
  Administered 2018-04-03 – 2018-04-06 (×4): 25 mg via ORAL
  Filled 2018-04-03 (×4): qty 1

## 2018-04-03 MED ORDER — ACETAMINOPHEN 500 MG PO TABS
1000.0000 mg | ORAL_TABLET | Freq: Once | ORAL | Status: AC
  Administered 2018-04-03: 1000 mg via ORAL
  Filled 2018-04-03: qty 2

## 2018-04-03 MED ORDER — SODIUM CHLORIDE 0.9 % IV SOLN
2.0000 g | Freq: Three times a day (TID) | INTRAVENOUS | Status: DC
  Administered 2018-04-03: 2 g via INTRAVENOUS
  Filled 2018-04-03 (×2): qty 2

## 2018-04-03 MED ORDER — SODIUM CHLORIDE 0.9 % IV SOLN
Freq: Once | INTRAVENOUS | Status: AC
  Administered 2018-04-03: 10:00:00 via INTRAVENOUS

## 2018-04-03 MED ORDER — SODIUM CHLORIDE 0.9 % IV SOLN
500.0000 mg | Freq: Once | INTRAVENOUS | Status: DC
  Filled 2018-04-03: qty 500

## 2018-04-03 MED ORDER — FUROSEMIDE 10 MG/ML IJ SOLN
40.0000 mg | Freq: Once | INTRAMUSCULAR | Status: AC
  Administered 2018-04-03: 40 mg via INTRAVENOUS
  Filled 2018-04-03: qty 4

## 2018-04-03 MED ORDER — ACETAMINOPHEN 650 MG RE SUPP: 650.0000 mg | Freq: Four times a day (QID) | RECTAL | Status: DC | PRN

## 2018-04-03 MED ORDER — SODIUM CHLORIDE 0.9 % IV SOLN
1.0000 g | Freq: Once | INTRAVENOUS | Status: DC
  Filled 2018-04-03 (×2): qty 10

## 2018-04-03 MED ORDER — SODIUM CHLORIDE 0.9 % IV SOLN
INTRAVENOUS | Status: AC
  Administered 2018-04-03: 2 g via INTRAVENOUS
  Filled 2018-04-03: qty 2

## 2018-04-03 MED ORDER — SODIUM CHLORIDE 0.9 % IV BOLUS
1000.0000 mL | Freq: Once | INTRAVENOUS | Status: AC
  Administered 2018-04-03: 1000 mL via INTRAVENOUS

## 2018-04-03 MED ORDER — CEFAZOLIN SODIUM-DEXTROSE 2-4 GM/100ML-% IV SOLN
2.0000 g | Freq: Three times a day (TID) | INTRAVENOUS | Status: DC
  Administered 2018-04-03 – 2018-04-06 (×9): 2 g via INTRAVENOUS
  Filled 2018-04-03 (×14): qty 100

## 2018-04-03 NOTE — ED Triage Notes (Signed)
Per EMS pt woke this morning with extreme shortness of breath. On EMS arrival resp rate 55 and O2 sat 86%. O2 applied. And resp tx given. On arrivial Code sepsis initiated

## 2018-04-03 NOTE — ED Notes (Signed)
Respiratory notified of a VBG sent to lab.

## 2018-04-03 NOTE — Progress Notes (Signed)
CRITICAL VALUE ALERT  Critical Value:  Lactic acid 2.5  Date & Time Notied:  04/03/1534 at 1540  Provider Notified: MD notified no new orders given at this time.  Pt expresses that he feels short of breath, PRN albuterol was given to pt. Pt stated that he feels better after the breathing treatment.   Dustin Lawrence [employer]

## 2018-04-03 NOTE — Progress Notes (Addendum)
CRITICAL VALUE ALERT  Critical Value:  Lactic acid- 2.9  Date & Time Notied: 04/03/2018 at 1215  Provider Notified:  MD notified, no new orders given at this time   Pt is resting in bed and has no complaints at this time. Will continue to monitor.   Dustin Lawrence [employer]

## 2018-04-03 NOTE — Progress Notes (Signed)
PHARMACY - PHYSICIAN COMMUNICATION CRITICAL VALUE ALERT - BLOOD CULTURE IDENTIFICATION (BCID)  Results for orders placed or performed during the hospital encounter of 04/03/18  Blood Culture ID Panel (Reflexed) (Collected: 04/03/2018  6:46 AM)  Result Value Ref Range   Enterococcus species NOT DETECTED NOT DETECTED   Listeria monocytogenes NOT DETECTED NOT DETECTED   Staphylococcus species NOT DETECTED NOT DETECTED   Staphylococcus aureus NOT DETECTED NOT DETECTED   Streptococcus species DETECTED (A) NOT DETECTED   Streptococcus agalactiae DETECTED (A) NOT DETECTED   Streptococcus pneumoniae NOT DETECTED NOT DETECTED   Streptococcus pyogenes NOT DETECTED NOT DETECTED   Acinetobacter baumannii NOT DETECTED NOT DETECTED   Enterobacteriaceae species NOT DETECTED NOT DETECTED   Enterobacter cloacae complex NOT DETECTED NOT DETECTED   Escherichia coli NOT DETECTED NOT DETECTED   Klebsiella oxytoca NOT DETECTED NOT DETECTED   Klebsiella pneumoniae NOT DETECTED NOT DETECTED   Proteus species NOT DETECTED NOT DETECTED   Serratia marcescens NOT DETECTED NOT DETECTED   Haemophilus influenzae NOT DETECTED NOT DETECTED   Neisseria meningitidis NOT DETECTED NOT DETECTED   Pseudomonas aeruginosa NOT DETECTED NOT DETECTED   Candida albicans NOT DETECTED NOT DETECTED   Candida glabrata NOT DETECTED NOT DETECTED   Candida krusei NOT DETECTED NOT DETECTED   Candida parapsilosis NOT DETECTED NOT DETECTED   Candida tropicalis NOT DETECTED NOT DETECTED    Name of physician (or Provider) Contacted:   Fritzi Mandes   Changes to prescribed antibiotics required:  Yes, will d/c azithromycin and ceftriaxone and begin Cefazolin 2 gm IV Q8H.   Carling Liberman D 04/03/2018  4:48 PM

## 2018-04-03 NOTE — Progress Notes (Signed)
CODE SEPSIS - PHARMACY COMMUNICATION  **Broad Spectrum Antibiotics should be administered within 1 hour of Sepsis diagnosis**  Time Code Sepsis Called/Page Received:  05/17 0635  Antibiotics Ordered: 05/17 5248  Time of 1st antibiotic administration: 05/17 0706  Additional action taken by pharmacy:   If necessary, Name of Provider/Nurse Contacted:     Eloise Harman ,PharmD Clinical Pharmacist  04/03/2018  7:09 AM
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
## 2018-04-03 NOTE — ED Notes (Signed)
Admitting doctor at bedside with patient and family.

## 2018-04-03 NOTE — ED Provider Notes (Signed)
[facility] Emergency Department Provider Note  ____________________________________________   First MD Initiated Contact with Patient 04/03/18 [phone]     (approximate)  I have reviewed the triage vital signs and the nursing notes.   HISTORY  Chief Complaint Code Sepsis  Level 5 exemption history is limited by the patient's clinical condition  HPI Dustin Lawrence is a 72 y.o. male who comes to the emergency department via EMS with fever cough shortness of breath and malaise for the past several hours.  The patient has a complex past medical history including hypertension, hypothyroidism, and dyslipidemia.  He has felt generally well although has had some flushing and cough for the past day or so.  He woke up this morning short of breath and his wife called 911.  Was given 1 breathing treatment in route with minimal effect.  Past Medical History:  Diagnosis Date  . Dyslipidemia   . History of chicken pox   . History of Graves' disease 1981-1986  . History of migraines   . Hypertension   . Hypothyroidism    Dr. [name] Endo, goal TSH 1.0  . Obesity   . Seasonal allergies     Patient Active Problem List   Diagnosis Date Noted  . Dyslipidemia   . Skin lesion 10/29/2011  . Hypertension   . Hypothyroidism   . Obesity       Prior to Admission medications   Medication Sig Start Date End Date Taking? Authorizing Provider  cholecalciferol (VITAMIN D) 1000 units tablet Take 1,000 Units by mouth daily.   Yes [provider]  hydrochlorothiazide (HYDRODIURIL) 25 MG tablet Take 1 tablet (25 mg total) by mouth daily. 08/11/12  Yes Ria Bush, MD  levothyroxine (SYNTHROID, LEVOTHROID) 200 MCG tablet Take 200 mcg by mouth daily before breakfast.   Yes [provider]  lisinopril (PRINIVIL,ZESTRIL) 10 MG tablet Take 1 tablet (10 mg total) by mouth daily. 08/11/12  Yes Ria Bush, MD  Multiple Vitamin (MULTIVITAMIN WITH MINERALS)  TABS tablet Take 1 tablet by mouth daily.   Yes [provider]  testosterone cypionate (DEPOTESTOSTERONE CYPIONATE) 200 MG/ML injection Inject 160 mg into the muscle every 14 (fourteen) days.   Yes [provider]  zoster vaccine live, PF, (ZOSTAVAX) 66063 UNT/0.65ML injection Inject 19,400 Units into the skin once. Patient not taking: Reported on 04/03/2018 08/11/12   Ria Bush, MD    Allergies Penicillins  Family History  Problem Relation Age of Onset  . Alzheimer's disease Mother   . Coronary artery disease Father        CABG  . Cancer Sister        breast, lung, brain  . Hypertension Brother   . Diabetes Brother   . Hyperlipidemia Brother     Social History Social History   Tobacco Use  . Smoking status: Never Smoker  . Smokeless tobacco: Never Used  Substance Use Topics  . Alcohol use: No  . Drug use: No    Review of Systems Level 5 exemption history limited by the patient's clinical condition  ____________________________________________   PHYSICAL EXAM:  VITAL SIGNS: ED Triage Vitals  Enc Vitals Group     BP      Pulse      Resp      Temp      Temp src      SpO2      Weight      Height      Head Circumference  Peak Flow      Pain Score      Pain Loc      Pain Edu?      Excl. in [place]?     Constitutional: Appears critically ill with elevated respiratory rate deeply flushed skin and using accessory muscles to breathe Eyes: PERRL EOMI. Head: Atraumatic. Nose: No congestion/rhinnorhea. Mouth/Throat: No trismus Neck: No stridor.   Cardiovascular: Tachycardic rate, regular rhythm. Grossly normal heart sounds.  Good peripheral circulation. Respiratory: Moderate respiratory distress using accessory muscles coarse breath sounds throughout Gastrointestinal: Soft nontender Musculoskeletal: 2+ pitting edema to midshin bilaterally legs equal in size Neurologic:  Normal speech and language. No gross focal neurologic deficits are  appreciated. Skin: Erythematous skin Psychiatric: Mood and affect are normal. Speech and behavior are normal.    ____________________________________________   DIFFERENTIAL includes but not limited to  Sepsis, pneumonia, bacteremia, influenza, thyrotoxicosis, thyroid storm ____________________________________________   LABS (all labs ordered are listed, but only abnormal results are displayed)  Labs Reviewed  CBC WITH DIFFERENTIAL/PLATELET - Abnormal; Notable for the following components:      Result Value   WBC 15.0 (*)    RBC 6.64 (*)    Hemoglobin 19.8 (*)    HCT 60.0 (*)    RDW 15.2 (*)    Neutro Abs 13.0 (*)    All other components within normal limits  URINALYSIS, ROUTINE W REFLEX MICROSCOPIC - Abnormal; Notable for the following components:   Color, Urine YELLOW (*)    APPearance CLEAR (*)    Hgb urine dipstick SMALL (*)    All other components within normal limits  CULTURE, BLOOD (ROUTINE X 2)  CULTURE, BLOOD (ROUTINE X 2)  URINE CULTURE  PROTIME-INR  BLOOD GAS, VENOUS  LACTIC ACID, PLASMA  LACTIC ACID, PLASMA  COMPREHENSIVE METABOLIC PANEL  LIPASE, BLOOD  TROPONIN I  PROCALCITONIN  TSH  T4, FREE  BRAIN NATRIURETIC PEPTIDE    Lab work reviewed by me with elevated white count concerning for infection __________________________________________  EKG  ED ECG REPORT I, Darel Hong, the attending physician, personally viewed and interpreted this ECG.  Date: 04/03/2018 EKG Time:  Rate: 147 Rhythm: Sinus tachycardia QRS Axis: Leftward axis Intervals: normal ST/T Wave abnormalities: normal Narrative Interpretation: no evidence of acute ischemia.  Poor R wave progression  ____________________________________________  RADIOLOGY  Chest x-ray reviewed by me concerning for left lower lobe pneumonia ____________________________________________   PROCEDURES  Procedure(s) performed: no  .Critical Care Performed by: Darel Hong, MD Authorized  by: Darel Hong, MD   Critical care provider statement:    Critical care time (minutes):  40   Critical care time was exclusive of:  Separately billable procedures and treating other patients   Critical care was necessary to treat or prevent imminent or life-threatening deterioration of the following conditions:  Sepsis and respiratory failure   Critical care was time spent personally by me on the following activities:  Development of treatment plan with patient or surrogate, discussions with consultants, evaluation of patient's response to treatment, examination of patient, obtaining history from patient or surrogate, ordering and performing treatments and interventions, ordering and review of laboratory studies, ordering and review of radiographic studies, pulse oximetry, re-evaluation of patient's condition and review of old charts    Critical Care performed: Yes  Observation: no ____________________________________________   INITIAL IMPRESSION / ASSESSMENT AND PLAN / ED COURSE  Pertinent labs & imaging results that were available during my care of the patient were reviewed by me and considered in  my medical decision making (see chart for details).  The patient arrives to the emergency department critically ill tachycardic, tachypneic, febrile, and hypoxic.  He has no history of pulmonary disease.  He is saturating about 89 or 90% on room air but comes up with nasal cannula.  Differential is broad but I am certainly concerned with sepsis primarily so we will start with 2 L of fluid broad labs and cultures and broad-spectrum antibiotics.  He does have a long-standing history of thyroid issues so we will add on a free T4 and a TSH as well on the off chance this could represent thyrotoxicosis.  The patient does not require intubation at this point.     ----------------------------------------- 7:33 AM on 04/03/2018 -----------------------------------------  I discussed the case with  Dr. Bobbye Charleston who has graciously agreed to admit the patient to his service. ____________________________________________   FINAL CLINICAL IMPRESSION(S) / ED DIAGNOSES  Final diagnoses:  Sepsis, due to unspecified organism [facility])      NEW MEDICATIONS STARTED DURING THIS VISIT:  New Prescriptions   No medications on file     Note:  This document was prepared using Dragon voice recognition software and may include unintentional dictation errors.     Darel Hong, MD 04/03/18 [phone]

## 2018-04-03 NOTE — Progress Notes (Signed)
Family Meeting Note  Advance Directive:No  Today a meeting took place with the patient wife and son in the ER   The following were discussed:Patient's diagnosis: it is being admitted with sepsis secondary to respiratory infection., Patient's progosis: good  code status address. Patient states he is otherwise active functional he would like to be full code.   Time spent during discussion: 16 minutes Fritzi Mandes, MD

## 2018-04-04 ENCOUNTER — Inpatient Hospital Stay: Payer: PPO

## 2018-04-04 DIAGNOSIS — Z880 Allergy status to penicillin: Secondary | ICD-10-CM

## 2018-04-04 DIAGNOSIS — D751 Secondary polycythemia: Secondary | ICD-10-CM

## 2018-04-04 DIAGNOSIS — D72829 Elevated white blood cell count, unspecified: Secondary | ICD-10-CM

## 2018-04-04 DIAGNOSIS — J189 Pneumonia, unspecified organism: Secondary | ICD-10-CM

## 2018-04-04 DIAGNOSIS — Z79890 Hormone replacement therapy (postmenopausal): Secondary | ICD-10-CM

## 2018-04-04 LAB — CBC
HCT: 51 % (ref 40.0–52.0)
HEMOGLOBIN: 17 g/dL (ref 13.0–18.0)
MCH: 29.8 pg (ref 26.0–34.0)
MCHC: 33.4 g/dL (ref 32.0–36.0)
MCV: 89.1 fL (ref 80.0–100.0)
Platelets: 174 10*3/uL (ref 150–440)
RBC: 5.72 MIL/uL (ref 4.40–5.90)
RDW: 15.4 % — ABNORMAL HIGH (ref 11.5–14.5)
WBC: 25.6 10*3/uL — ABNORMAL HIGH (ref 3.8–10.6)

## 2018-04-04 LAB — LACTIC ACID, PLASMA: LACTIC ACID, VENOUS: 1.7 mmol/L (ref 0.5–1.9)

## 2018-04-04 LAB — URINE CULTURE

## 2018-04-04 NOTE — Progress Notes (Addendum)
[place] [place] NAME: Rob Mciver    MR#:  [id number]  DATE OF BIRTH:  May 02, 1946  SUBJECTIVE:   Out in the chair. Complains of back pain. Feels a lot better. Eating breakfast. REVIEW OF SYSTEMS:   Review of Systems  Constitutional: Positive for malaise/fatigue. Negative for chills, fever and weight loss.  HENT: Negative for ear discharge, ear pain and nosebleeds.   Eyes: Negative for blurred vision, pain and discharge.  Respiratory: Positive for shortness of breath. Negative for sputum production, wheezing and stridor.   Cardiovascular: Negative for chest pain, palpitations, orthopnea and PND.  Gastrointestinal: Negative for abdominal pain, diarrhea, nausea and vomiting.  Genitourinary: Negative for frequency and urgency.  Musculoskeletal: Positive for back pain. Negative for joint pain.  Neurological: Negative for sensory change, speech change, focal weakness and weakness.  Psychiatric/Behavioral: Negative for depression and hallucinations. The patient is not nervous/anxious.    Tolerating Diet:yes Tolerating PT: ambulatory  DRUG ALLERGIES:   Allergies  Allergen Reactions  . Penicillins Hives and Other (See Comments)    Has patient had a PCN reaction causing immediate rash, facial/tongue/throat swelling, SOB or lightheadedness with hypotension: Yes Has patient had a PCN reaction causing severe rash involving mucus membranes or skin necrosis: No Has patient had a PCN reaction that required hospitalization: No Has patient had a PCN reaction occurring within the last 10 years: No If all of the above answers are "NO", then may proceed with Cephalosporin use.     VITALS:  Blood pressure 127/62, pulse 95, temperature 97.9 F (36.6 C), temperature source Oral, resp. rate 20, height 5\' 9"  (1.753 m), weight 120.6 kg (265 lb 12.8 oz), SpO2 99 %.  PHYSICAL EXAMINATION:   Physical Exam  GENERAL:  72 y.o.-year-old  patient lying in the bed with no acute distress. Red skin all over EYES: Pupils equal, round, reactive to light and accommodation. No scleral icterus. Extraocular muscles intact.  HEENT: Head atraumatic, normocephalic. Oropharynx and nasopharynx clear.  NECK:  Supple, no jugular venous distention. No thyroid enlargement, no tenderness.  LUNGS: Normal breath sounds bilaterally, no wheezing, rales, rhonchi. No use of accessory muscles of respiration.  CARDIOVASCULAR: S1, S2 normal. No murmurs, rubs, or gallops.  ABDOMEN: Soft, nontender, nondistended. Bowel sounds present. No organomegaly or mass.  EXTREMITIES: No cyanosis, clubbing or edema b/l.   Left LE cellulitis improving from skin marking NEUROLOGIC: Cranial nerves II through XII are intact. No focal Motor or sensory deficits b/l.   PSYCHIATRIC:  patient is alert and oriented x 3.  SKIN: No obvious rash, lesion, or ulcer.   LABORATORY PANEL:  CBC Recent Labs  Lab 04/04/18 0344  WBC 25.6*  HGB 17.0  HCT 51.0  PLT 174    Chemistries  Recent Labs  Lab 04/03/18 0646  NA 135  K 4.3  CL 101  CO2 23  GLUCOSE 104*  BUN 15  CREATININE 1.05  CALCIUM 8.6*  AST 25  ALT 19  ALKPHOS 70  BILITOT 0.9   Cardiac Enzymes Recent Labs  Lab 04/03/18 0646  TROPONINI <0.03   RADIOLOGY:  US Venous Img Lower Unilateral Left  Result Date: 04/04/2018 CLINICAL DATA:  72 year old male with a history of swelling EXAM: LEFT LOWER EXTREMITY VENOUS DOPPLER ULTRASOUND TECHNIQUE: Gray-scale sonography with graded compression, as well as color Doppler and duplex ultrasound were performed to evaluate the lower extremity deep venous systems from the level of the common femoral vein and including the common  femoral, femoral, profunda femoral, popliteal and calf veins including the posterior tibial, peroneal and gastrocnemius veins when visible. The superficial great saphenous vein was also interrogated. Spectral Doppler was utilized to evaluate flow at  rest and with distal augmentation maneuvers in the common femoral, femoral and popliteal veins. COMPARISON:  None. FINDINGS: Contralateral Common Femoral Vein: Respiratory phasicity is normal and symmetric with the symptomatic side. No evidence of thrombus. Normal compressibility. Common Femoral Vein: No evidence of thrombus. Normal compressibility, respiratory phasicity and response to augmentation. Saphenofemoral Junction: No evidence of thrombus. Normal compressibility and flow on color Doppler imaging. Profunda Femoral Vein: No evidence of thrombus. Normal compressibility and flow on color Doppler imaging. Femoral Vein: No evidence of thrombus. Normal compressibility, respiratory phasicity and response to augmentation. Popliteal Vein: No evidence of thrombus. Normal compressibility, respiratory phasicity and response to augmentation. Calf Veins: No evidence of thrombus. Normal compressibility and flow on color Doppler imaging. Superficial Great Saphenous Vein: No evidence of thrombus. Normal compressibility and flow on color Doppler imaging. Other Findings:  Lymph nodes in the left inguinal region. IMPRESSION: Sonographic survey of the left lower extremity negative for DVT. Potentially reactive lymph nodes in the left inguinal region. Electronically Signed   By: Corrie Mckusick D.O.   On: 04/04/2018 11:34   Dg Chest Port 1 View  Result Date: 04/03/2018 CLINICAL DATA:  Acute onset of severe shortness of breath and fever. EXAM: PORTABLE CHEST 1 VIEW COMPARISON:  None. FINDINGS: The lungs are well-aerated. Vascular congestion is noted. Minimal left basilar airspace opacity may reflect atelectasis or possibly mild pneumonia. There is no evidence of pleural effusion or pneumothorax. The cardiomediastinal silhouette is borderline enlarged. No acute osseous abnormalities are seen. IMPRESSION: Vascular congestion and borderline cardiomegaly. Minimal left basilar airspace opacity may reflect atelectasis or possibly  mild pneumonia. Electronically Signed   By: Garald Balding M.D.   On: 04/03/2018 06:59   ASSESSMENT AND PLAN:  Zuhair Lariccia  is a 72 y.o. male with a known history of acquired hypothyroidism, dyslipidemia, hypertension comes to the emergency room with increasing shortness of breath cough fever of 103. Patient was found to have elevated white count. Chest x-ray shows possible early pneumonia.   1.  Streptococcal Pneumonia sepsis secondary to  early pneumonia -presented with high-grade fever, tachycardia, elevated white count, elevated lactic acid and chest x-ray with possible delivery pneumonia -received IV fluids -IV Rocephin and Zithromax---IV cefazolin -MRSA PCR-- negative -WBC count 15,000--- 25K  2. tachycardia due to number one  3. Elevated hemoglobin and hematocrit--? Suspect polycythemia vera -patient has history of facial erythema and redness of skin in the upper extremity. Outpatient CBC shows persistent elevated H&H -patient does not remember any workup for PCV -no history of smoking -consider hematology referral for PCV workup--spoke with dr Grayland Ormond. -Patient also is on testosterone shots. However patient reports he had elevated H&H prior to the shots was started.  4. Acquired hypothyroidism -history of Graves' disease -TSH is .67 -will decrease Synthroid to 150 micrograms daily  5. Morbid obesity with suspected sleep apnea -recommend patient get sleep study as outpatient  6. Hypertension -resume home meds  7. DVT prophylaxis subcu Lovenox  8. History of low testosterone -patient takes bimonthly IM shots at home  9.10. Left LE cellulitis--improving with IV abxs Left LE Doppler negative for DVT  Discuss with patient and wife  Case discussed with Care Management/Social Worker. Management plans discussed with the patient, family and they are in agreement.  CODE STATUS: FULL  DVT  Prophylaxis: lovenox  TOTAL TIME TAKING CARE OF THIS PATIENT: 30  minutes.  >50% time spent on counselling and coordination of care  POSSIBLE D/C IN *1-2* DAYS, DEPENDING ON CLINICAL CONDITION.  Note: This dictation was prepared with Dragon dictation along with smaller phrase technology. Any transcriptional errors that result from this process are unintentional.  Fritzi Mandes M.D on 04/04/2018 at 1:41 PM  Between 7am to 6pm - Pager - [phone]  After 6pm go to [email or web address] - password [email or web address] [facility]  Office  [phone]  CC: Primary care physician; [name], MDPatient ID: [name], male   DOB: [birth year], 72 y.o.   MRN: [id number]
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
## 2018-04-04 NOTE — Progress Notes (Signed)
Pt reports his left lower extremity is feeling tight, it is red with 2+ pitting edema, pt reports this as a chronic problem but states the redness is "new". Pt has generalized redness to entire body except Right lower extremity that pt reports as chronic. MD aware, See new orders. Kyla Balzarine, LPN

## 2018-04-05 LAB — IRON AND TIBC
Iron: 16 ug/dL — ABNORMAL LOW (ref 45–182)
Saturation Ratios: 5 % — ABNORMAL LOW (ref 17.9–39.5)
TIBC: 329 ug/dL (ref 250–450)
UIBC: 313 ug/dL

## 2018-04-05 LAB — FERRITIN: Ferritin: 177 ng/mL (ref 24–336)

## 2018-04-05 NOTE — Progress Notes (Signed)
[place] [facility] [place] NAME: Dustin Lawrence    MR#:  [id number]  DATE OF BIRTH:  [birth year]  SUBJECTIVE:   . Complains of back pain. Feels a lot better. Eating breakfast. REVIEW OF SYSTEMS:   Review of Systems  Constitutional: Positive for malaise/fatigue. Negative for chills, fever and weight loss.  HENT: Negative for ear discharge, ear pain and nosebleeds.   Eyes: Negative for blurred vision, pain and discharge.  Respiratory: Negative for sputum production, wheezing and stridor.   Cardiovascular: Negative for chest pain, palpitations, orthopnea and PND.  Gastrointestinal: Negative for abdominal pain, diarrhea, nausea and vomiting.  Genitourinary: Negative for frequency and urgency.  Musculoskeletal: Positive for back pain. Negative for joint pain.  Neurological: Negative for sensory change, speech change, focal weakness and weakness.  Psychiatric/Behavioral: Negative for depression and hallucinations. The patient is not nervous/anxious.    Tolerating Diet:yes Tolerating PT: ambulatory  DRUG ALLERGIES:   Allergies  Allergen Reactions  . Penicillins Hives and Other (See Comments)    Has patient had a PCN reaction causing immediate rash, facial/tongue/throat swelling, SOB or lightheadedness with hypotension: Yes Has patient had a PCN reaction causing severe rash involving mucus membranes or skin necrosis: No Has patient had a PCN reaction that required hospitalization: No Has patient had a PCN reaction occurring within the last 10 years: No If all of the above answers are "NO", then may proceed with Cephalosporin use.     VITALS:  Blood pressure 138/75, pulse 88, temperature 98.3 F (36.8 C), temperature source Oral, resp. rate 18, height 5\' 9"  (1.753 m), weight 120.6 kg (265 lb 12.8 oz), SpO2 97 %.  PHYSICAL EXAMINATION:   Physical Exam  GENERAL:  72 y.o.-year-old patient lying in the bed with no acute distress. Red  skin all over EYES: Pupils equal, round, reactive to light and accommodation. No scleral icterus. Extraocular muscles intact.  HEENT: Head atraumatic, normocephalic. Oropharynx and nasopharynx clear.  NECK:  Supple, no jugular venous distention. No thyroid enlargement, no tenderness.  LUNGS: Normal breath sounds bilaterally, no wheezing, rales, rhonchi. No use of accessory muscles of respiration.  CARDIOVASCULAR: S1, S2 normal. No murmurs, rubs, or gallops.  ABDOMEN: Soft, nontender, nondistended. Bowel sounds present. No organomegaly or mass.  EXTREMITIES: No cyanosis, clubbing or edema b/l.    NEUROLOGIC: Cranial nerves II through XII are intact. No focal Motor or sensory deficits b/l.   PSYCHIATRIC:  patient is alert and oriented x 3.  SKIN: No obvious rash, lesion, or ulcer.   LABORATORY PANEL:  CBC Recent Labs  Lab 04/04/18 0344  WBC 25.6*  HGB 17.0  HCT 51.0  PLT 174    Chemistries  Recent Labs  Lab 04/03/18 0646  NA 135  K 4.3  CL 101  CO2 23  GLUCOSE 104*  BUN 15  CREATININE 1.05  CALCIUM 8.6*  AST 25  ALT 19  ALKPHOS 70  BILITOT 0.9   Cardiac Enzymes Recent Labs  Lab 04/03/18 0646  TROPONINI <0.03   RADIOLOGY:  US Venous Img Lower Unilateral Left  Result Date: 04/04/2018 CLINICAL DATA:  72 year old male with a history of swelling EXAM: LEFT LOWER EXTREMITY VENOUS DOPPLER ULTRASOUND TECHNIQUE: Gray-scale sonography with graded compression, as well as color Doppler and duplex ultrasound were performed to evaluate the lower extremity deep venous systems from the level of the common femoral vein and including the common femoral, femoral, profunda femoral, popliteal and calf veins including the posterior tibial, peroneal and  gastrocnemius veins when visible. The superficial great saphenous vein was also interrogated. Spectral Doppler was utilized to evaluate flow at rest and with distal augmentation maneuvers in the common femoral, femoral and popliteal veins.  COMPARISON:  None. FINDINGS: Contralateral Common Femoral Vein: Respiratory phasicity is normal and symmetric with the symptomatic side. No evidence of thrombus. Normal compressibility. Common Femoral Vein: No evidence of thrombus. Normal compressibility, respiratory phasicity and response to augmentation. Saphenofemoral Junction: No evidence of thrombus. Normal compressibility and flow on color Doppler imaging. Profunda Femoral Vein: No evidence of thrombus. Normal compressibility and flow on color Doppler imaging. Femoral Vein: No evidence of thrombus. Normal compressibility, respiratory phasicity and response to augmentation. Popliteal Vein: No evidence of thrombus. Normal compressibility, respiratory phasicity and response to augmentation. Calf Veins: No evidence of thrombus. Normal compressibility and flow on color Doppler imaging. Superficial Great Saphenous Vein: No evidence of thrombus. Normal compressibility and flow on color Doppler imaging. Other Findings:  Lymph nodes in the left inguinal region. IMPRESSION: Sonographic survey of the left lower extremity negative for DVT. Potentially reactive lymph nodes in the left inguinal region. Electronically Signed   By: Corrie Mckusick D.O.   On: 04/04/2018 11:34   ASSESSMENT AND PLAN:  Dustin Lawrence  is a 72 y.o. male with a known history of acquired hypothyroidism, dyslipidemia, hypertension comes to the emergency room with increasing shortness of breath cough fever of 103. Patient was found to have elevated white count. Chest x-ray shows possible early pneumonia.   1.  Streptococcal Pneumonia sepsis secondary to  early pneumonia -presented with high-grade fever, tachycardia, elevated white count, elevated lactic acid and chest x-ray with possible delivery pneumonia -received IV fluids -IV Rocephin and Zithromax---IV cefazolin -MRSA PCR-- negative -WBC count 15,000--- 25K  2. tachycardia due to #1  3. Elevated hemoglobin and hematocrit--?  Suspect polycythemia vera -patient has history of facial erythema and redness of skin in the upper extremity. Outpatient CBC shows persistent elevated H&H -patient does not remember any workup for PCV -no history of smoking -consider hematology referral for PCV workup--spoke with dr Grayland Ormond. Labs sent out--f/u out pt -Patient also is on testosterone shots. However patient reports he had elevated H&H prior to the shots was started.  4. Acquired hypothyroidism -history of Graves' disease -TSH is .2 -will decrease Synthroid to 150 micrograms daily  5. Morbid obesity with suspected sleep apnea -recommend patient get sleep study as outpatient  6. Hypertension -resume home meds  7. DVT prophylaxis subcu Lovenox  8. History of low testosterone -patient takes bimonthly IM shots at home  9. Suspected OSA overnite pulse oximetry -RN reports sats dropped in the 80's during night  Discuss with patient and wife  Case discussed with Care Management/Social Worker. Management plans discussed with the patient, family and they are in agreement.  CODE STATUS: FULL  DVT Prophylaxis: lovenox  TOTAL TIME TAKING CARE OF THIS PATIENT: 30 minutes.  >50% time spent on counselling and coordination of care  POSSIBLE D/C IN *1-2* DAYS, DEPENDING ON CLINICAL CONDITION.  Note: This dictation was prepared with Dragon dictation along with smaller phrase technology. Any transcriptional errors that result from this process are unintentional.  Fritzi Mandes M.D on 04/05/2018 at 8:56 AM  Between 7am to 6pm - Pager - [phone]  After 6pm go to [email or web address] - password [email or web address] [facility]  Office  [phone]  CC: Primary care physician; [name], MDPatient ID: Dustin Lawrence, male   DOB: Dec 25, 1945, 72 y.o.  MRN: [id number]

## 2018-04-06 LAB — CBC
HEMATOCRIT: 53.1 % — AB (ref 40.0–52.0)
HEMOGLOBIN: 17.9 g/dL (ref 13.0–18.0)
MCH: 30.2 pg (ref 26.0–34.0)
MCHC: 33.7 g/dL (ref 32.0–36.0)
MCV: 89.7 fL (ref 80.0–100.0)
Platelets: 205 10*3/uL (ref 150–440)
RBC: 5.91 MIL/uL — AB (ref 4.40–5.90)
RDW: 15.9 % — ABNORMAL HIGH (ref 11.5–14.5)
WBC: 9.9 10*3/uL (ref 3.8–10.6)

## 2018-04-06 LAB — CULTURE, BLOOD (ROUTINE X 2)
SPECIAL REQUESTS: ADEQUATE
Special Requests: ADEQUATE

## 2018-04-06 MED ORDER — CEPHALEXIN 500 MG PO CAPS: 500.0000 mg | ORAL_CAPSULE | Freq: Three times a day (TID) | ORAL | Status: DC

## 2018-04-06 MED ORDER — CEPHALEXIN 500 MG PO CAPS: 500.0000 mg | ORAL_CAPSULE | Freq: Three times a day (TID) | ORAL | 0 refills | Status: AC

## 2018-04-06 NOTE — Care Management Important Message (Signed)
Important Message  Patient Details  Name: CASEY FYE MRN: [id number] Date of Birth: 01-Jan-1946   Medicare Important Message Given:  Yes    Juliann Pulse A Kea Callan 04/06/2018, 11:12 AM

## 2018-04-06 NOTE — Progress Notes (Signed)
Overnight oximetry initiated. Pt is currently on rm. Air.

## 2018-04-06 NOTE — Progress Notes (Signed)
PT Cancellation Note  Patient Details Name: Dustin Lawrence MRN: [id number] DOB: 04/05/1946   Cancelled Treatment:    Reason Eval/Treat Not Completed: Patient declined, no reason specified.  Order received.  Chart reviewed.  Pt eating breakfast.  Will check back in shortly.   Roxanne Gates, PT, DPT 04/06/2018, 8:26 AM

## 2018-04-06 NOTE — Progress Notes (Signed)
Pt is being discharged today, instructions reviewed with the patient and he verified understanding. 0 paper prescriptions were given to him, IV x2 was removed. All belongings packed and returned to the patient. He is currently waiting on his home 02 tank to be delivered and will then be rolled out in a wheelchair by staff.

## 2018-04-06 NOTE — Discharge Summary (Signed)
[facility] [place] NAME: Dustin Lawrence    MR#:  [id number]  DATE OF BIRTH:  1946/07/09  DATE OF ADMISSION:  04/03/2018 ADMITTING PHYSICIAN: Fritzi Mandes, MD  DATE OF DISCHARGE: 04/06/2018  PRIMARY CARE PHYSICIAN: [name], MD    ADMISSION DIAGNOSIS:  Sepsis, due to unspecified organism ([place]) [A41.9]  DISCHARGE DIAGNOSIS:  Streptococcal Pneumonia Sepsis Leucocytosis--resolved CAP Suspected OSA/Sleep apnea with desaturations in the nightime--pt will need formal sleep study Elevated H/H--f/u for ??PCV w/u SECONDARY DIAGNOSIS:   Past Medical History:  Diagnosis Date  . Dyslipidemia   . History of chicken pox   . History of Graves' disease 1981-1986  . History of migraines   . Hypertension   . Hypothyroidism    Dr. [name] Endo, goal TSH 1.0  . Obesity   . Seasonal allergies     HOSPITAL COURSE:   Dustin Lawrence a72 y.o.malewith a known history of acquired hypothyroidism, dyslipidemia, hypertension comes to the emergency room with increasing shortness of breath cough fever of 103. Patient was found to have elevated white count. Chest x-ray shows possible early pneumonia.  1.Streptococcal Pneumonia sepsis secondary to pneumonia -presented with high-grade fever,tachycardia, elevated white count, elevated lactic acid and chest x-ray with possible delivery pneumonia -received IV fluids -IV Rocephin and Zithromax---IV cefazolin--po Keflex (total 10 days) -MRSAPCR-- negative -WBC count 15,000--- 25K--9.5K  2.tachycardia due to #1  3.Elevated hemoglobin and hematocrit--?Suspect polycythemia vera -patient has history of facial erythema and redness of skin in the upper extremity. Outpatient CBC shows persistent elevated H&H -patient does not remember any workup for PCV -no history of smoking -consider hematology referral for PCV workup--spoke with dr Grayland Ormond.Labs sent out--f/u out pt -Patient also is  on testosterone shots. However patient reports he had elevated H&H prior to the shots was started.  4.Acquired hypothyroidism -history of Graves' disease -TSH is .9 -will decrease Synthroid to114micrograms daily  5.Morbid obesity with suspected sleep apnea -recommend patient get sleep study as outpatient  6.Hypertension -resume home meds  7.DVT prophylaxis subcu Lovenox  8.History of low testosterone -patient takes bimonthly IM shots at home  9. Suspected OSA overnite pulse oximetry--revealed desaturations <88% for 1 hour and 10 mins. Pt should go home with oxygen for night time and get formal sleep study done as out pt. -CM checking into getting oxygen  10. Left LE cellulitis--improving with IV abxs Left LE Doppler negative for DVT  Discuss with patient and wife   CONSULTS OBTAINED:  Treatment Team:  Lloyd Huger, MD  DRUG ALLERGIES:   Allergies  Allergen Reactions  . Penicillins Hives and Other (See Comments)    Has patient had a PCN reaction causing immediate rash, facial/tongue/throat swelling, SOB or lightheadedness with hypotension: Yes Has patient had a PCN reaction causing severe rash involving mucus membranes or skin necrosis: No Has patient had a PCN reaction that required hospitalization: No Has patient had a PCN reaction occurring within the last 10 years: No If all of the above answers are "NO", then may proceed with Cephalosporin use.     DISCHARGE MEDICATIONS:   Allergies as of 04/06/2018      Reactions   Penicillins Hives, Other (See Comments)   Has patient had a PCN reaction causing immediate rash, facial/tongue/throat swelling, SOB or lightheadedness with hypotension: Yes Has patient had a PCN reaction causing severe rash involving mucus membranes or skin necrosis: No Has patient had a PCN reaction that required hospitalization: No Has patient had a PCN  reaction occurring within the last 10 years: No If all of the above  answers are "NO", then may proceed with Cephalosporin use.      Medication List    TAKE these medications   cephALEXin 500 MG capsule Commonly known as:  KEFLEX Take 1 capsule (500 mg total) by mouth 3 (three) times daily for 7 days.   cholecalciferol 1000 units tablet Commonly known as:  VITAMIN D Take 1,000 Units by mouth daily.   hydrochlorothiazide 25 MG tablet Commonly known as:  HYDRODIURIL Take 1 tablet (25 mg total) by mouth daily.   levothyroxine 200 MCG tablet Commonly known as:  SYNTHROID, LEVOTHROID Take 200 mcg by mouth daily before breakfast.   lisinopril 10 MG tablet Commonly known as:  PRINIVIL,ZESTRIL Take 1 tablet (10 mg total) by mouth daily.   multivitamin with minerals Tabs tablet Take 1 tablet by mouth daily.   testosterone cypionate 200 MG/ML injection Commonly known as:  DEPOTESTOSTERONE CYPIONATE Inject 160 mg into the muscle every 14 (fourteen) days.       If you experience worsening of your admission symptoms, develop shortness of breath, life threatening emergency, suicidal or homicidal thoughts you must seek medical attention immediately by calling 911 or calling your MD immediately  if symptoms less severe.  You Must read complete instructions/literature along with all the possible adverse reactions/side effects for all the Medicines you take and that have been prescribed to you. Take any new Medicines after you have completely understood and accept all the possible adverse reactions/side effects.   Please note  You were cared for by a hospitalist during your hospital stay. If you have any questions about your discharge medications or the care you received while you were in the hospital after you are discharged, you can call the unit and asked to speak with the hospitalist on call if the hospitalist that took care of you is not available. Once you are discharged, your primary care physician will handle any further medical issues. Please note  that NO REFILLS for any discharge medications will be authorized once you are discharged, as it is imperative that you return to your primary care physician (or establish a relationship with a primary care physician if you do not have one) for your aftercare needs so that they can reassess your need for medications and monitor your lab values. Today   SUBJECTIVE   Feels better  VITAL SIGNS:  Blood pressure 139/68, pulse 93, temperature 98.2 F (36.8 C), temperature source Oral, resp. rate 16, height 5\' 9"  (1.753 m), weight 120.6 kg (265 lb 12.8 oz), SpO2 91 %.  I/O:    Intake/Output Summary (Last 24 hours) at 04/06/2018 1020 Last data filed at 04/06/2018 0400 Gross per 24 hour  Intake 480 ml  Output 300 ml  Net 180 ml    PHYSICAL EXAMINATION:  GENERAL:  72 y.o.-year-old patient lying in the bed with no acute distress.  EYES: Pupils equal, round, reactive to light and accommodation. No scleral icterus. Extraocular muscles intact.  HEENT: Head atraumatic, normocephalic. Oropharynx and nasopharynx clear.  NECK:  Supple, no jugular venous distention. No thyroid enlargement, no tenderness.  LUNGS: Normal breath sounds bilaterally, no wheezing, rales,rhonchi or crepitation. No use of accessory muscles of respiration.  CARDIOVASCULAR: S1, S2 normal. No murmurs, rubs, or gallops.  ABDOMEN: Soft, non-tender, non-distended. Bowel sounds present. No organomegaly or mass.  EXTREMITIES: No pedal edema, cyanosis, or clubbing. Left LE cellulitis improving NEUROLOGIC: Cranial nerves II through XII are intact.  Muscle strength 5/5 in all extremities. Sensation intact. Gait not checked.  PSYCHIATRIC: The patient is alert and oriented x 3.  SKIN: No obvious rash, lesion, or ulcer.   DATA REVIEW:   CBC  Recent Labs  Lab 04/06/18 0352  WBC 9.9  HGB 17.9  HCT 53.1*  PLT 205    Chemistries  Recent Labs  Lab 04/03/18 0646  NA 135  K 4.3  CL 101  CO2 23  GLUCOSE 104*  BUN 15  CREATININE  1.05  CALCIUM 8.6*  AST 25  ALT 19  ALKPHOS 70  BILITOT 0.9    Microbiology Results   Recent Results (from the past 240 hour(s))  Blood Culture (routine x 2)     Status: Abnormal   Collection Time: 04/03/18  6:46 AM  Result Value Ref Range Status   Specimen Description   Final    BLOOD BLOOD RIGHT HAND Performed at [facility], [address] [place], [place] [address]    Special Requests   Final    BOTTLES DRAWN AEROBIC AND ANAEROBIC Blood Culture adequate volume Performed at [facility], [address] [place], [place] [address]    Culture  Setup Time   Final    GRAM POSITIVE COCCI IN BOTH AEROBIC AND ANAEROBIC BOTTLES CRITICAL RESULT CALLED TO, READ BACK BY AND VERIFIED WITH: JASON ROBBINS 04/03/18 AT 1637 BY HS Performed at [facility], [place] [address], [place], [place] [address]    Culture GROUP B STREP(S.AGALACTIAE)ISOLATED (A)  Final   Report Status 04/06/2018 FINAL  Final   Organism ID, Bacteria GROUP B STREP(S.AGALACTIAE)ISOLATED  Final      Susceptibility   Group b strep(s.agalactiae)isolated - MIC*    CLINDAMYCIN <=0.25 SENSITIVE Sensitive     AMPICILLIN <=0.25 SENSITIVE Sensitive     ERYTHROMYCIN 2 RESISTANT Resistant     VANCOMYCIN 0.5 SENSITIVE Sensitive     CEFTRIAXONE <=0.12 SENSITIVE Sensitive     LEVOFLOXACIN 1 SENSITIVE Sensitive     PENICILLIN Value in next row Sensitive      SENSITIVE<=0.06    * GROUP B STREP(S.AGALACTIAE)ISOLATED  Blood Culture (routine x 2)     Status: Abnormal   Collection Time: 04/03/18  6:46 AM  Result Value Ref Range Status   Specimen Description   Final    BLOOD LEFT ANTECUBITAL Performed at [facility], [address] [place], [place] [address]    Special Requests   Final    BOTTLES DRAWN AEROBIC AND ANAEROBIC Blood Culture adequate volume Performed at [facility], [address] [place], [place] [address]    Culture  Setup Time   Final    GRAM POSITIVE COCCI IN  BOTH AEROBIC AND ANAEROBIC BOTTLES CRITICAL RESULT CALLED TO, READ BACK BY AND VERIFIED WITH: JASON ROBBINS 04/03/18 AT 1637 BY HS Performed at [facility], [address] [place], [place] [address]    Culture (A)  Final    GROUP B STREP(S.AGALACTIAE)ISOLATED SUSCEPTIBILITIES PERFORMED ON PREVIOUS CULTURE WITHIN THE LAST 5 DAYS. Performed at [facility], [place] [address] [place], [place] [address]    Report Status 04/06/2018 FINAL  Final  Urine culture     Status: Abnormal   Collection Time: 04/03/18  6:46 AM  Result Value Ref Range Status   Specimen Description   Final    URINE, RANDOM Performed at [facility], [address] [place], [place] [address]    Special Requests   Final  NONE Performed at [facility], [address] [place], [place] [address]    Culture (A)  Final    <10,000 COLONIES/mL INSIGNIFICANT GROWTH Performed at [place] [address] [place], [place] [address]    Report Status 04/04/2018 FINAL  Final  Blood Culture ID Panel (Reflexed)     Status: Abnormal   Collection Time: 04/03/18  6:46 AM  Result Value Ref Range Status   Enterococcus species NOT DETECTED NOT DETECTED Final   Listeria monocytogenes NOT DETECTED NOT DETECTED Final   Staphylococcus species NOT DETECTED NOT DETECTED Final   Staphylococcus aureus NOT DETECTED NOT DETECTED Final   Streptococcus species DETECTED (A) NOT DETECTED Final    Comment: CRITICAL RESULT CALLED TO, READ BACK BY AND VERIFIED WITH: JASON ROBBINS 04/03/18 AT 1637 BY HS    Streptococcus agalactiae DETECTED (A) NOT DETECTED Final    Comment: CRITICAL RESULT CALLED TO, READ BACK BY AND VERIFIED WITH: JASON ROBBINS 04/03/18 AT 1637 BY HS    Streptococcus pneumoniae NOT DETECTED NOT DETECTED Final   Streptococcus pyogenes NOT DETECTED NOT DETECTED Final   Acinetobacter baumannii NOT DETECTED NOT DETECTED Final   Enterobacteriaceae species NOT DETECTED NOT DETECTED Final    Enterobacter cloacae complex NOT DETECTED NOT DETECTED Final   Escherichia coli NOT DETECTED NOT DETECTED Final   Klebsiella oxytoca NOT DETECTED NOT DETECTED Final   Klebsiella pneumoniae NOT DETECTED NOT DETECTED Final   Proteus species NOT DETECTED NOT DETECTED Final   Serratia marcescens NOT DETECTED NOT DETECTED Final   Haemophilus influenzae NOT DETECTED NOT DETECTED Final   Neisseria meningitidis NOT DETECTED NOT DETECTED Final   Pseudomonas aeruginosa NOT DETECTED NOT DETECTED Final   Candida albicans NOT DETECTED NOT DETECTED Final   Candida glabrata NOT DETECTED NOT DETECTED Final   Candida krusei NOT DETECTED NOT DETECTED Final   Candida parapsilosis NOT DETECTED NOT DETECTED Final   Candida tropicalis NOT DETECTED NOT DETECTED Final    Comment: Performed at [facility], [address] [place], [place] [address]  MRSA PCR Screening     Status: None   Collection Time: 04/03/18  7:33 AM  Result Value Ref Range Status   MRSA by PCR NEGATIVE NEGATIVE Final    Comment:        The GeneXpert MRSA Assay (FDA approved for NASAL specimens only), is one component of a comprehensive MRSA colonization surveillance program. It is not intended to diagnose MRSA infection nor to guide or monitor treatment for MRSA infections. Performed at [facility], [address] [place], [place] [address]     RADIOLOGY:  US Venous Img Lower Unilateral Left  Result Date: 04/04/2018 CLINICAL DATA:  72 year old male with a history of swelling EXAM: LEFT LOWER EXTREMITY VENOUS DOPPLER ULTRASOUND TECHNIQUE: Gray-scale sonography with graded compression, as well as color Doppler and duplex ultrasound were performed to evaluate the lower extremity deep venous systems from the level of the common femoral vein and including the common femoral, femoral, profunda femoral, popliteal and calf veins including the posterior tibial, peroneal and gastrocnemius veins when visible. The  superficial great saphenous vein was also interrogated. Spectral Doppler was utilized to evaluate flow at rest and with distal augmentation maneuvers in the common femoral, femoral and popliteal veins. COMPARISON:  None. FINDINGS: Contralateral Common Femoral Vein: Respiratory phasicity is normal and symmetric with the symptomatic side. No evidence of thrombus. Normal compressibility. Common Femoral Vein: No evidence of thrombus. Normal compressibility, respiratory phasicity and response  to augmentation. Saphenofemoral Junction: No evidence of thrombus. Normal compressibility and flow on color Doppler imaging. Profunda Femoral Vein: No evidence of thrombus. Normal compressibility and flow on color Doppler imaging. Femoral Vein: No evidence of thrombus. Normal compressibility, respiratory phasicity and response to augmentation. Popliteal Vein: No evidence of thrombus. Normal compressibility, respiratory phasicity and response to augmentation. Calf Veins: No evidence of thrombus. Normal compressibility and flow on color Doppler imaging. Superficial Great Saphenous Vein: No evidence of thrombus. Normal compressibility and flow on color Doppler imaging. Other Findings:  Lymph nodes in the left inguinal region. IMPRESSION: Sonographic survey of the left lower extremity negative for DVT. Potentially reactive lymph nodes in the left inguinal region. Electronically Signed   By: Corrie Mckusick D.O.   On: 04/04/2018 11:34     Management plans discussed with the patient, family and they are in agreement.  CODE STATUS:     Code Status Orders  (From admission, onward)        Start     Ordered   04/03/18 0832  Full code  Continuous     04/03/18 0831    Code Status History    This patient has a current code status but no historical code status.      TOTAL TIME TAKING CARE OF THIS PATIENT: *40* minutes.    Fritzi Mandes M.D on 04/06/2018 at 10:20 AM  Between 7am to 6pm - Pager - [phone] After 6pm go to  [email or web address] - password [email or web address] [facility]  Office  [phone]  CC: Primary care physician; [name], MD

## 2018-04-06 NOTE — Progress Notes (Signed)
Pt placed back on O2 @ 1 lpm due to frequent desats on rm air.  Overnight oximetry still in progress.

## 2018-04-06 NOTE — Care Management (Signed)
Discharge to home today per Dr. Posey Pronto. Home oxygen will be arranged per [place].  Family/friend will transport Shelbie Ammons RN MSN [facility] [phone]

## 2018-04-06 NOTE — Discharge Instructions (Signed)
Pt advised to get Sleep study done as out pt

## 2018-04-06 NOTE — Progress Notes (Signed)
PT Cancellation Note  Patient Details Name: REYN FAIVRE MRN: [id number] DOB: November 26, 1945   Cancelled Treatment:    Reason Eval/Treat Not Completed: PT screened, no needs identified, will sign off;Patient declined, no reason specified.  Patient screened and functioning at baseline.  States that he does not feel he will benefit from PT at this time.  Will complete order.   Roxanne Gates, PT, DPT 04/06/2018, 9:32 AM

## 2018-04-07 DIAGNOSIS — J189 Pneumonia, unspecified organism: Secondary | ICD-10-CM | POA: Diagnosis not present

## 2018-04-07 LAB — ERYTHROPOIETIN: ERYTHROPOIETIN: 56.5 m[IU]/mL — AB (ref 2.6–18.5)

## 2018-04-09 DIAGNOSIS — R0902 Hypoxemia: Secondary | ICD-10-CM | POA: Diagnosis not present

## 2018-04-09 DIAGNOSIS — I10 Essential (primary) hypertension: Secondary | ICD-10-CM | POA: Diagnosis not present

## 2018-04-09 DIAGNOSIS — J189 Pneumonia, unspecified organism: Secondary | ICD-10-CM | POA: Diagnosis not present

## 2018-04-14 LAB — JAK2  V617F QUAL. WITH REFLEX TO EXON 12

## 2018-04-14 LAB — JAK2 EXONS 12-15

## 2018-05-03 DIAGNOSIS — D751 Secondary polycythemia: Secondary | ICD-10-CM | POA: Insufficient documentation
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
## 2018-05-04 ENCOUNTER — Encounter: Payer: Self-pay | Admitting: Oncology

## 2018-05-04 ENCOUNTER — Other Ambulatory Visit: Payer: Self-pay

## 2018-05-04 ENCOUNTER — Inpatient Hospital Stay: Payer: PPO | Attending: Oncology | Admitting: Oncology

## 2018-05-04 ENCOUNTER — Inpatient Hospital Stay: Payer: PPO

## 2018-05-04 DIAGNOSIS — D751 Secondary polycythemia: Secondary | ICD-10-CM

## 2018-05-04 LAB — CBC
HCT: 58.7 % — ABNORMAL HIGH (ref 40.0–52.0)
Hemoglobin: 20 g/dL — ABNORMAL HIGH (ref 13.0–18.0)
MCH: 30.3 pg (ref 26.0–34.0)
MCHC: 34.1 g/dL (ref 32.0–36.0)
MCV: 88.8 fL (ref 80.0–100.0)
PLATELETS: 197 10*3/uL (ref 150–440)
RBC: 6.6 MIL/uL — AB (ref 4.40–5.90)
RDW: 16.4 % — ABNORMAL HIGH (ref 11.5–14.5)
WBC: 9.7 10*3/uL (ref 3.8–10.6)

## 2018-05-04 NOTE — Progress Notes (Signed)
Here for hospital follow . Stated overall feeling " dull and tired " able to do physical work qd x 2 h he stated.

## 2018-05-05 LAB — ERYTHROPOIETIN: Erythropoietin: 18.5 m[IU]/mL (ref 2.6–18.5)

## 2018-05-08 DIAGNOSIS — J189 Pneumonia, unspecified organism: Secondary | ICD-10-CM | POA: Diagnosis not present

## 2018-05-11 ENCOUNTER — Inpatient Hospital Stay: Payer: PPO

## 2018-06-01 DIAGNOSIS — E039 Hypothyroidism, unspecified: Secondary | ICD-10-CM | POA: Diagnosis not present

## 2018-06-01 DIAGNOSIS — Z125 Encounter for screening for malignant neoplasm of prostate: Secondary | ICD-10-CM | POA: Diagnosis not present

## 2018-06-01 DIAGNOSIS — E119 Type 2 diabetes mellitus without complications: Secondary | ICD-10-CM | POA: Diagnosis not present

## 2018-06-01 DIAGNOSIS — R7989 Other specified abnormal findings of blood chemistry: Secondary | ICD-10-CM | POA: Diagnosis not present

## 2018-06-01 DIAGNOSIS — Z79899 Other long term (current) drug therapy: Secondary | ICD-10-CM | POA: Diagnosis not present

## 2018-06-07 DIAGNOSIS — J189 Pneumonia, unspecified organism: Secondary | ICD-10-CM | POA: Diagnosis not present

## 2018-06-08 DIAGNOSIS — R739 Hyperglycemia, unspecified: Secondary | ICD-10-CM | POA: Diagnosis not present

## 2018-06-08 DIAGNOSIS — Z79899 Other long term (current) drug therapy: Secondary | ICD-10-CM | POA: Diagnosis not present

## 2018-06-08 DIAGNOSIS — R7989 Other specified abnormal findings of blood chemistry: Secondary | ICD-10-CM | POA: Diagnosis not present

## 2018-06-08 DIAGNOSIS — E039 Hypothyroidism, unspecified: Secondary | ICD-10-CM | POA: Diagnosis not present

## 2018-06-08 DIAGNOSIS — I10 Essential (primary) hypertension: Secondary | ICD-10-CM | POA: Diagnosis not present

## 2018-06-17 LAB — COMP PANEL: LEUKEMIA/LYMPHOMA
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
## 2018-06-23 ENCOUNTER — Other Ambulatory Visit: Payer: Self-pay | Admitting: *Deleted

## 2018-06-23 DIAGNOSIS — D45 Polycythemia vera: Secondary | ICD-10-CM

## 2018-06-25 ENCOUNTER — Inpatient Hospital Stay: Payer: PPO | Attending: Oncology

## 2018-06-25 ENCOUNTER — Encounter: Payer: Self-pay | Admitting: Oncology

## 2018-06-25 ENCOUNTER — Inpatient Hospital Stay (HOSPITAL_BASED_OUTPATIENT_CLINIC_OR_DEPARTMENT_OTHER): Payer: PPO | Admitting: Oncology

## 2018-06-25 ENCOUNTER — Other Ambulatory Visit: Payer: Self-pay

## 2018-06-25 VITALS — BP 151/76 | HR 84 | Temp 97.3°F | Resp 18 | Wt 262.1 lb

## 2018-06-25 DIAGNOSIS — D45 Polycythemia vera: Secondary | ICD-10-CM

## 2018-06-25 DIAGNOSIS — D751 Secondary polycythemia: Secondary | ICD-10-CM

## 2018-06-25 LAB — IRON AND TIBC
Iron: 47 ug/dL (ref 45–182)
SATURATION RATIOS: 10 % — AB (ref 17.9–39.5)
TIBC: 456 ug/dL — ABNORMAL HIGH (ref 250–450)
UIBC: 409 ug/dL

## 2018-06-25 LAB — CBC WITH DIFFERENTIAL/PLATELET
BASOS ABS: 0 10*3/uL (ref 0–0.1)
BASOS PCT: 1 %
EOS ABS: 0.2 10*3/uL (ref 0–0.7)
EOS PCT: 3 %
HCT: 56.9 % — ABNORMAL HIGH (ref 40.0–52.0)
HEMOGLOBIN: 19 g/dL — AB (ref 13.0–18.0)
Lymphocytes Relative: 24 %
Lymphs Abs: 2.1 10*3/uL (ref 1.0–3.6)
MCH: 30 pg (ref 26.0–34.0)
MCHC: 33.3 g/dL (ref 32.0–36.0)
MCV: 89.9 fL (ref 80.0–100.0)
Monocytes Absolute: 0.7 10*3/uL (ref 0.2–1.0)
Monocytes Relative: 8 %
NEUTROS PCT: 66 %
Neutro Abs: 5.9 10*3/uL (ref 1.4–6.5)
PLATELETS: 246 10*3/uL (ref 150–440)
RBC: 6.33 MIL/uL — AB (ref 4.40–5.90)
RDW: 16.1 % — ABNORMAL HIGH (ref 11.5–14.5)
WBC: 8.9 10*3/uL (ref 3.8–10.6)

## 2018-06-25 LAB — FERRITIN: FERRITIN: 15 ng/mL — AB (ref 24–336)

## 2018-06-25 NOTE — Progress Notes (Signed)
Pt in for follow up reports "last time I had blood drawn off I felt better for 3 days".  Very tired and fatigued all the time.

## 2018-07-01 ENCOUNTER — Inpatient Hospital Stay: Payer: PPO

## 2018-07-01 VITALS — BP 156/66 | HR 81 | Temp 96.2°F | Resp 20

## 2018-07-01 DIAGNOSIS — D751 Secondary polycythemia: Secondary | ICD-10-CM | POA: Diagnosis not present

## 2018-07-08 DIAGNOSIS — J189 Pneumonia, unspecified organism: Secondary | ICD-10-CM | POA: Diagnosis not present

## 2018-08-08 DIAGNOSIS — J189 Pneumonia, unspecified organism: Secondary | ICD-10-CM | POA: Diagnosis not present

## 2018-08-10 ENCOUNTER — Telehealth: Payer: Self-pay

## 2018-08-10 ENCOUNTER — Inpatient Hospital Stay: Payer: PPO | Attending: Oncology

## 2018-08-10 NOTE — Telephone Encounter (Signed)
Patient is requesting a note from Dr. Grayland Ormond to excuse him from jury duty.

## 2018-09-07 DIAGNOSIS — J189 Pneumonia, unspecified organism: Secondary | ICD-10-CM | POA: Diagnosis not present
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
## 2018-09-14 ENCOUNTER — Encounter: Payer: Self-pay | Admitting: Oncology

## 2018-09-14 ENCOUNTER — Inpatient Hospital Stay: Payer: PPO | Attending: Oncology

## 2018-09-14 ENCOUNTER — Inpatient Hospital Stay (HOSPITAL_BASED_OUTPATIENT_CLINIC_OR_DEPARTMENT_OTHER): Payer: PPO | Admitting: Oncology

## 2018-09-14 ENCOUNTER — Inpatient Hospital Stay: Payer: PPO

## 2018-09-14 VITALS — BP 124/79 | HR 89 | Temp 97.3°F | Resp 18 | Wt 264.0 lb

## 2018-09-14 VITALS — BP 154/81 | HR 84 | Resp 18

## 2018-09-14 DIAGNOSIS — D751 Secondary polycythemia: Secondary | ICD-10-CM | POA: Insufficient documentation

## 2018-09-14 DIAGNOSIS — R5383 Other fatigue: Secondary | ICD-10-CM | POA: Diagnosis not present

## 2018-09-14 DIAGNOSIS — Z79899 Other long term (current) drug therapy: Secondary | ICD-10-CM | POA: Diagnosis not present

## 2018-09-14 DIAGNOSIS — E611 Iron deficiency: Secondary | ICD-10-CM

## 2018-09-14 DIAGNOSIS — D45 Polycythemia vera: Secondary | ICD-10-CM

## 2018-09-14 DIAGNOSIS — R531 Weakness: Secondary | ICD-10-CM

## 2018-09-14 LAB — CBC WITH DIFFERENTIAL/PLATELET
Abs Immature Granulocytes: 0.06 10*3/uL (ref 0.00–0.07)
Basophils Absolute: 0.1 10*3/uL (ref 0.0–0.1)
Basophils Relative: 1 %
EOS ABS: 0.2 10*3/uL (ref 0.0–0.5)
EOS PCT: 2 %
HEMATOCRIT: 56.5 % — AB (ref 39.0–52.0)
HEMOGLOBIN: 18.3 g/dL — AB (ref 13.0–17.0)
Immature Granulocytes: 1 %
LYMPHS ABS: 2.4 10*3/uL (ref 0.7–4.0)
LYMPHS PCT: 24 %
MCH: 28.4 pg (ref 26.0–34.0)
MCHC: 32.4 g/dL (ref 30.0–36.0)
MCV: 87.6 fL (ref 80.0–100.0)
MONO ABS: 1.1 10*3/uL — AB (ref 0.1–1.0)
Monocytes Relative: 11 %
Neutro Abs: 5.9 10*3/uL (ref 1.7–7.7)
Neutrophils Relative %: 61 %
Platelets: 274 10*3/uL (ref 150–400)
RBC: 6.45 MIL/uL — ABNORMAL HIGH (ref 4.22–5.81)
RDW: 14.4 % (ref 11.5–15.5)
WBC: 9.7 10*3/uL (ref 4.0–10.5)
nRBC: 0 % (ref 0.0–0.2)

## 2018-09-14 LAB — FERRITIN: Ferritin: 13 ng/mL — ABNORMAL LOW (ref 24–336)

## 2018-09-14 LAB — IRON AND TIBC
Iron: 66 ug/dL (ref 45–182)
SATURATION RATIOS: 12 % — AB (ref 17.9–39.5)
TIBC: 559 ug/dL — AB (ref 250–450)
UIBC: 493 ug/dL

## 2018-09-14 NOTE — Progress Notes (Signed)
Pt in for follow up, reports "not feeling well at all, very tired and have no energy".

## 2018-09-22 DIAGNOSIS — H3561 Retinal hemorrhage, right eye: Secondary | ICD-10-CM | POA: Diagnosis not present

## 2018-09-22 DIAGNOSIS — Z961 Presence of intraocular lens: Secondary | ICD-10-CM | POA: Diagnosis not present

## 2018-09-22 DIAGNOSIS — H43811 Vitreous degeneration, right eye: Secondary | ICD-10-CM | POA: Diagnosis not present

## 2018-10-08 DIAGNOSIS — J189 Pneumonia, unspecified organism: Secondary | ICD-10-CM | POA: Diagnosis not present

## 2018-11-07 DIAGNOSIS — J189 Pneumonia, unspecified organism: Secondary | ICD-10-CM | POA: Diagnosis not present

## 2018-11-09 ENCOUNTER — Other Ambulatory Visit: Payer: Self-pay

## 2018-11-09 ENCOUNTER — Inpatient Hospital Stay: Payer: PPO | Attending: Oncology

## 2018-11-09 ENCOUNTER — Inpatient Hospital Stay: Payer: PPO

## 2018-11-09 DIAGNOSIS — D751 Secondary polycythemia: Secondary | ICD-10-CM | POA: Insufficient documentation

## 2018-11-09 DIAGNOSIS — D45 Polycythemia vera: Secondary | ICD-10-CM

## 2018-11-09 LAB — CBC WITH DIFFERENTIAL/PLATELET
Abs Immature Granulocytes: 0.02 10*3/uL (ref 0.00–0.07)
Basophils Absolute: 0.1 10*3/uL (ref 0.0–0.1)
Basophils Relative: 1 %
Eosinophils Absolute: 0.3 10*3/uL (ref 0.0–0.5)
Eosinophils Relative: 4 %
HCT: 52.8 % — ABNORMAL HIGH (ref 39.0–52.0)
Hemoglobin: 16.9 g/dL (ref 13.0–17.0)
Immature Granulocytes: 0 %
Lymphocytes Relative: 24 %
Lymphs Abs: 2.1 10*3/uL (ref 0.7–4.0)
MCH: 27.8 pg (ref 26.0–34.0)
MCHC: 32 g/dL (ref 30.0–36.0)
MCV: 86.8 fL (ref 80.0–100.0)
Monocytes Absolute: 0.8 10*3/uL (ref 0.1–1.0)
Monocytes Relative: 9 %
Neutro Abs: 5.3 10*3/uL (ref 1.7–7.7)
Neutrophils Relative %: 62 %
Platelets: 247 10*3/uL (ref 150–400)
RBC: 6.08 MIL/uL — AB (ref 4.22–5.81)
RDW: 15.3 % (ref 11.5–15.5)
WBC: 8.5 10*3/uL (ref 4.0–10.5)
nRBC: 0 % (ref 0.0–0.2)

## 2018-11-09 LAB — IRON AND TIBC
Iron: 39 ug/dL — ABNORMAL LOW (ref 45–182)
SATURATION RATIOS: 8 % — AB (ref 17.9–39.5)
TIBC: 462 ug/dL — AB (ref 250–450)
UIBC: 423 ug/dL

## 2018-11-09 LAB — FERRITIN: Ferritin: 10 ng/mL — ABNORMAL LOW (ref 24–336)

## 2018-12-07 DIAGNOSIS — I10 Essential (primary) hypertension: Secondary | ICD-10-CM | POA: Diagnosis not present

## 2018-12-07 DIAGNOSIS — R739 Hyperglycemia, unspecified: Secondary | ICD-10-CM | POA: Diagnosis not present

## 2018-12-07 DIAGNOSIS — R7989 Other specified abnormal findings of blood chemistry: Secondary | ICD-10-CM | POA: Diagnosis not present

## 2018-12-07 DIAGNOSIS — E039 Hypothyroidism, unspecified: Secondary | ICD-10-CM | POA: Diagnosis not present

## 2018-12-07 DIAGNOSIS — Z79899 Other long term (current) drug therapy: Secondary | ICD-10-CM | POA: Diagnosis not present

## 2018-12-08 DIAGNOSIS — J189 Pneumonia, unspecified organism: Secondary | ICD-10-CM | POA: Diagnosis not present

## 2018-12-10 DIAGNOSIS — Z0000 Encounter for general adult medical examination without abnormal findings: Secondary | ICD-10-CM | POA: Diagnosis not present

## 2018-12-10 DIAGNOSIS — Z1211 Encounter for screening for malignant neoplasm of colon: Secondary | ICD-10-CM | POA: Diagnosis not present

## 2018-12-10 DIAGNOSIS — I499 Cardiac arrhythmia, unspecified: Secondary | ICD-10-CM | POA: Diagnosis not present
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
## 2018-12-29 ENCOUNTER — Other Ambulatory Visit: Payer: Self-pay

## 2018-12-29 ENCOUNTER — Inpatient Hospital Stay (HOSPITAL_BASED_OUTPATIENT_CLINIC_OR_DEPARTMENT_OTHER): Payer: PPO | Admitting: Oncology

## 2018-12-29 ENCOUNTER — Inpatient Hospital Stay: Payer: PPO | Attending: Oncology

## 2018-12-29 ENCOUNTER — Encounter: Payer: Self-pay | Admitting: Oncology

## 2018-12-29 ENCOUNTER — Inpatient Hospital Stay: Payer: PPO

## 2018-12-29 VITALS — BP 153/80 | HR 88 | Temp 98.2°F | Resp 20

## 2018-12-29 DIAGNOSIS — D751 Secondary polycythemia: Secondary | ICD-10-CM

## 2018-12-29 DIAGNOSIS — D45 Polycythemia vera: Secondary | ICD-10-CM

## 2018-12-29 DIAGNOSIS — E0500 Thyrotoxicosis with diffuse goiter without thyrotoxic crisis or storm: Secondary | ICD-10-CM

## 2018-12-29 DIAGNOSIS — Z79899 Other long term (current) drug therapy: Secondary | ICD-10-CM | POA: Diagnosis not present

## 2018-12-29 LAB — CBC WITH DIFFERENTIAL/PLATELET
Abs Immature Granulocytes: 0.04 10*3/uL (ref 0.00–0.07)
Basophils Absolute: 0.1 10*3/uL (ref 0.0–0.1)
Basophils Relative: 1 %
Eosinophils Absolute: 0.3 10*3/uL (ref 0.0–0.5)
Eosinophils Relative: 3 %
HCT: 53.3 % — ABNORMAL HIGH (ref 39.0–52.0)
HEMOGLOBIN: 16.8 g/dL (ref 13.0–17.0)
Immature Granulocytes: 0 %
Lymphocytes Relative: 25 %
Lymphs Abs: 2.4 10*3/uL (ref 0.7–4.0)
MCH: 27 pg (ref 26.0–34.0)
MCHC: 31.5 g/dL (ref 30.0–36.0)
MCV: 85.7 fL (ref 80.0–100.0)
Monocytes Absolute: 1 10*3/uL (ref 0.1–1.0)
Monocytes Relative: 10 %
Neutro Abs: 5.7 10*3/uL (ref 1.7–7.7)
Neutrophils Relative %: 61 %
Platelets: 269 10*3/uL (ref 150–400)
RBC: 6.22 MIL/uL — ABNORMAL HIGH (ref 4.22–5.81)
RDW: 15.5 % (ref 11.5–15.5)
WBC: 9.5 10*3/uL (ref 4.0–10.5)
nRBC: 0 % (ref 0.0–0.2)

## 2018-12-29 LAB — IRON AND TIBC
Iron: 42 ug/dL — ABNORMAL LOW (ref 45–182)
Saturation Ratios: 8 % — ABNORMAL LOW (ref 17.9–39.5)
TIBC: 533 ug/dL — ABNORMAL HIGH (ref 250–450)
UIBC: 491 ug/dL

## 2018-12-29 LAB — FERRITIN: Ferritin: 10 ng/mL — ABNORMAL LOW (ref 24–336)

## 2018-12-29 NOTE — Progress Notes (Signed)
Patient here today for follow up regarding polycythemia. Patient reports he starts to feel bad when it gets closer to time for phlebotomy.

## 2018-12-30 ENCOUNTER — Ambulatory Visit: Payer: PPO | Admitting: Oncology

## 2018-12-30 ENCOUNTER — Other Ambulatory Visit: Payer: PPO

## 2019-01-06 DIAGNOSIS — Z961 Presence of intraocular lens: Secondary | ICD-10-CM | POA: Diagnosis not present

## 2019-01-06 DIAGNOSIS — H43811 Vitreous degeneration, right eye: Secondary | ICD-10-CM | POA: Diagnosis not present

## 2019-01-06 DIAGNOSIS — H3561 Retinal hemorrhage, right eye: Secondary | ICD-10-CM | POA: Diagnosis not present

## 2019-01-07 DIAGNOSIS — L821 Other seborrheic keratosis: Secondary | ICD-10-CM | POA: Diagnosis not present

## 2019-01-07 DIAGNOSIS — Z08 Encounter for follow-up examination after completed treatment for malignant neoplasm: Secondary | ICD-10-CM | POA: Diagnosis not present

## 2019-01-07 DIAGNOSIS — D044 Carcinoma in situ of skin of scalp and neck: Secondary | ICD-10-CM | POA: Diagnosis not present

## 2019-01-07 DIAGNOSIS — L570 Actinic keratosis: Secondary | ICD-10-CM | POA: Diagnosis not present

## 2019-01-07 DIAGNOSIS — X32XXXA Exposure to sunlight, initial encounter: Secondary | ICD-10-CM | POA: Diagnosis not present

## 2019-01-07 DIAGNOSIS — Z85828 Personal history of other malignant neoplasm of skin: Secondary | ICD-10-CM | POA: Diagnosis not present

## 2019-01-07 DIAGNOSIS — D485 Neoplasm of uncertain behavior of skin: Secondary | ICD-10-CM | POA: Diagnosis not present

## 2019-01-08 DIAGNOSIS — J189 Pneumonia, unspecified organism: Secondary | ICD-10-CM | POA: Diagnosis not present

## 2019-01-11 ENCOUNTER — Ambulatory Visit: Payer: PPO | Admitting: Oncology

## 2019-01-11 ENCOUNTER — Other Ambulatory Visit: Payer: PPO

## 2019-02-23 ENCOUNTER — Other Ambulatory Visit: Payer: PPO

## 2019-03-09 DIAGNOSIS — J189 Pneumonia, unspecified organism: Secondary | ICD-10-CM | POA: Diagnosis not present

## 2019-03-23 ENCOUNTER — Inpatient Hospital Stay: Payer: PPO

## 2019-03-25 ENCOUNTER — Other Ambulatory Visit: Payer: Self-pay

## 2019-03-25 NOTE — Patient Outreach (Signed)
[place] [facility]) Care Management  03/25/2019  SHADE KALEY 04/16/1946 [id number]   Referral Date: 03/25/2019 Referral Source: Nurseline Referral Reason: right earache   Outreach Attempt: no answer. HIPAA compliant voice message left.    Plan: RN CM will attempt patient again within 4 business days and send letter.   Jone Baseman, RN, MSN [facility] Care Management Care Management Coordinator Direct Line [phone] Toll Free: [phone]  Fax: [phone]

## 2019-03-26 DIAGNOSIS — H6981 Other specified disorders of Eustachian tube, right ear: Secondary | ICD-10-CM | POA: Diagnosis not present

## 2019-03-26 DIAGNOSIS — H6501 Acute serous otitis media, right ear: Secondary | ICD-10-CM | POA: Diagnosis not present

## 2019-03-30 ENCOUNTER — Other Ambulatory Visit: Payer: Self-pay

## 2019-03-30 NOTE — Patient Outreach (Signed)
[facility]) Care Management  03/30/2019  ZIMERE DUNLEVY 1946/05/16 [id number]   Referral Date: 03/25/2019 Referral Source: Nurseline Referral Reason: right earache   Outreach Attempt: no answer. HIPAA compliant voice message left.    Plan: RN CM will attempt patient again within 4 business days.  Jone Baseman, RN, MSN [place] Management Care Management Coordinator Direct Line [phone] Cell [phone] Toll Free: [phone]  Fax: [phone]

## 2019-04-01 ENCOUNTER — Other Ambulatory Visit: Payer: Self-pay

## 2019-04-01 NOTE — Patient Outreach (Signed)
[facility]) Care Management  04/01/2019  Dustin Lawrence [birth year] [id number]   Referral Date:03/25/2019 Referral Source:Nurseline Referral Reason:right earache   Outreach Attempt: spoke with patient.  He is able to verify HIPAA.  He states that he is doing fine.  He states that he saw his physician and was given steroids and nasal spray as he states his eustachian tube was clogged.  He states that the doctor advised him that it would probably take about 2 weeks to clear.  Advised patient that if symptoms get worse or do not improve to contact physician again.  He verbalized understanding and declines any further needs.    Plan: RN CM will close case.  Jone Baseman, RN, MSN [place] Management Care Management Coordinator Direct Line [phone] Cell [phone] Toll Free: [phone]  Fax: [phone]

## 2019-04-08 DIAGNOSIS — J189 Pneumonia, unspecified organism: Secondary | ICD-10-CM | POA: Diagnosis not present
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
## 2019-04-27 ENCOUNTER — Inpatient Hospital Stay: Payer: PPO

## 2019-04-27 ENCOUNTER — Encounter: Payer: Self-pay | Admitting: Oncology

## 2019-04-27 ENCOUNTER — Inpatient Hospital Stay: Payer: PPO | Attending: Oncology

## 2019-04-27 ENCOUNTER — Inpatient Hospital Stay (HOSPITAL_BASED_OUTPATIENT_CLINIC_OR_DEPARTMENT_OTHER): Payer: PPO | Admitting: Oncology

## 2019-04-27 ENCOUNTER — Other Ambulatory Visit: Payer: Self-pay

## 2019-04-27 VITALS — BP 139/83 | HR 84 | Resp 20

## 2019-04-27 DIAGNOSIS — E039 Hypothyroidism, unspecified: Secondary | ICD-10-CM

## 2019-04-27 DIAGNOSIS — D751 Secondary polycythemia: Secondary | ICD-10-CM

## 2019-04-27 DIAGNOSIS — Z7951 Long term (current) use of inhaled steroids: Secondary | ICD-10-CM | POA: Diagnosis not present

## 2019-04-27 DIAGNOSIS — I10 Essential (primary) hypertension: Secondary | ICD-10-CM | POA: Diagnosis not present

## 2019-04-27 DIAGNOSIS — E669 Obesity, unspecified: Secondary | ICD-10-CM | POA: Insufficient documentation

## 2019-04-27 DIAGNOSIS — Z79890 Hormone replacement therapy (postmenopausal): Secondary | ICD-10-CM | POA: Diagnosis not present

## 2019-04-27 DIAGNOSIS — E785 Hyperlipidemia, unspecified: Secondary | ICD-10-CM | POA: Insufficient documentation

## 2019-04-27 DIAGNOSIS — Z791 Long term (current) use of non-steroidal anti-inflammatories (NSAID): Secondary | ICD-10-CM | POA: Insufficient documentation

## 2019-04-27 DIAGNOSIS — Z79899 Other long term (current) drug therapy: Secondary | ICD-10-CM | POA: Diagnosis not present

## 2019-04-27 DIAGNOSIS — D45 Polycythemia vera: Secondary | ICD-10-CM

## 2019-04-27 LAB — IRON AND TIBC
Iron: 40 ug/dL — ABNORMAL LOW (ref 45–182)
Saturation Ratios: 8 % — ABNORMAL LOW (ref 17.9–39.5)
TIBC: 486 ug/dL — ABNORMAL HIGH (ref 250–450)
UIBC: 446 ug/dL

## 2019-04-27 LAB — CBC WITH DIFFERENTIAL/PLATELET
Abs Immature Granulocytes: 0.03 10*3/uL (ref 0.00–0.07)
Basophils Absolute: 0.1 10*3/uL (ref 0.0–0.1)
Basophils Relative: 1 %
Eosinophils Absolute: 0.3 10*3/uL (ref 0.0–0.5)
Eosinophils Relative: 3 %
HCT: 55.1 % — ABNORMAL HIGH (ref 39.0–52.0)
Hemoglobin: 17 g/dL (ref 13.0–17.0)
Immature Granulocytes: 0 %
Lymphocytes Relative: 25 %
Lymphs Abs: 2.3 10*3/uL (ref 0.7–4.0)
MCH: 25.8 pg — ABNORMAL LOW (ref 26.0–34.0)
MCHC: 30.9 g/dL (ref 30.0–36.0)
MCV: 83.7 fL (ref 80.0–100.0)
Monocytes Absolute: 0.7 10*3/uL (ref 0.1–1.0)
Monocytes Relative: 8 %
Neutro Abs: 6.1 10*3/uL (ref 1.7–7.7)
Neutrophils Relative %: 63 %
Platelets: 269 10*3/uL (ref 150–400)
RBC: 6.58 MIL/uL — ABNORMAL HIGH (ref 4.22–5.81)
RDW: 18.2 % — ABNORMAL HIGH (ref 11.5–15.5)
WBC: 9.5 10*3/uL (ref 4.0–10.5)
nRBC: 0 % (ref 0.0–0.2)

## 2019-04-27 LAB — FERRITIN: Ferritin: 9 ng/mL — ABNORMAL LOW (ref 24–336)

## 2019-04-27 NOTE — Progress Notes (Signed)
Patient stated that he had been doing well with no complaints. 

## 2019-05-01 DIAGNOSIS — L02419 Cutaneous abscess of limb, unspecified: Secondary | ICD-10-CM | POA: Diagnosis not present

## 2019-05-01 DIAGNOSIS — L03119 Cellulitis of unspecified part of limb: Secondary | ICD-10-CM | POA: Diagnosis not present

## 2019-05-26 DIAGNOSIS — Z1211 Encounter for screening for malignant neoplasm of colon: Secondary | ICD-10-CM | POA: Diagnosis not present

## 2019-06-03 DIAGNOSIS — Z125 Encounter for screening for malignant neoplasm of prostate: Secondary | ICD-10-CM | POA: Diagnosis not present

## 2019-06-03 DIAGNOSIS — R7989 Other specified abnormal findings of blood chemistry: Secondary | ICD-10-CM | POA: Diagnosis not present

## 2019-06-03 DIAGNOSIS — E039 Hypothyroidism, unspecified: Secondary | ICD-10-CM | POA: Diagnosis not present

## 2019-06-03 DIAGNOSIS — Z79899 Other long term (current) drug therapy: Secondary | ICD-10-CM | POA: Diagnosis not present

## 2019-06-03 DIAGNOSIS — I10 Essential (primary) hypertension: Secondary | ICD-10-CM | POA: Diagnosis not present

## 2019-06-03 DIAGNOSIS — E119 Type 2 diabetes mellitus without complications: Secondary | ICD-10-CM | POA: Diagnosis not present

## 2019-06-09 DIAGNOSIS — L570 Actinic keratosis: Secondary | ICD-10-CM | POA: Diagnosis not present

## 2019-06-09 DIAGNOSIS — D0439 Carcinoma in situ of skin of other parts of face: Secondary | ICD-10-CM | POA: Diagnosis not present

## 2019-06-09 DIAGNOSIS — D044 Carcinoma in situ of skin of scalp and neck: Secondary | ICD-10-CM | POA: Diagnosis not present

## 2019-06-10 DIAGNOSIS — N183 Chronic kidney disease, stage 3 (moderate): Secondary | ICD-10-CM | POA: Diagnosis not present

## 2019-06-10 DIAGNOSIS — Z79899 Other long term (current) drug therapy: Secondary | ICD-10-CM | POA: Diagnosis not present

## 2019-06-10 DIAGNOSIS — E039 Hypothyroidism, unspecified: Secondary | ICD-10-CM | POA: Diagnosis not present

## 2019-06-10 DIAGNOSIS — Z125 Encounter for screening for malignant neoplasm of prostate: Secondary | ICD-10-CM | POA: Diagnosis not present

## 2019-06-10 DIAGNOSIS — I10 Essential (primary) hypertension: Secondary | ICD-10-CM | POA: Diagnosis not present

## 2019-06-10 DIAGNOSIS — R7989 Other specified abnormal findings of blood chemistry: Secondary | ICD-10-CM | POA: Diagnosis not present

## 2019-06-10 DIAGNOSIS — R739 Hyperglycemia, unspecified: Secondary | ICD-10-CM | POA: Diagnosis not present
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
## 2019-06-28 ENCOUNTER — Other Ambulatory Visit: Payer: Self-pay

## 2019-06-29 ENCOUNTER — Other Ambulatory Visit: Payer: Self-pay

## 2019-06-29 ENCOUNTER — Inpatient Hospital Stay: Payer: PPO | Attending: Oncology

## 2019-06-29 ENCOUNTER — Inpatient Hospital Stay (HOSPITAL_BASED_OUTPATIENT_CLINIC_OR_DEPARTMENT_OTHER): Payer: PPO | Admitting: Oncology

## 2019-06-29 ENCOUNTER — Inpatient Hospital Stay: Payer: PPO

## 2019-06-29 ENCOUNTER — Encounter: Payer: Self-pay | Admitting: Oncology

## 2019-06-29 VITALS — BP 137/82 | HR 94 | Temp 98.7°F | Wt 256.2 lb

## 2019-06-29 DIAGNOSIS — R5383 Other fatigue: Secondary | ICD-10-CM | POA: Insufficient documentation

## 2019-06-29 DIAGNOSIS — D751 Secondary polycythemia: Secondary | ICD-10-CM | POA: Insufficient documentation

## 2019-06-29 DIAGNOSIS — D45 Polycythemia vera: Secondary | ICD-10-CM

## 2019-06-29 DIAGNOSIS — Z79899 Other long term (current) drug therapy: Secondary | ICD-10-CM | POA: Diagnosis not present

## 2019-06-29 DIAGNOSIS — R531 Weakness: Secondary | ICD-10-CM | POA: Insufficient documentation

## 2019-06-29 LAB — CBC WITH DIFFERENTIAL/PLATELET
Abs Immature Granulocytes: 0.03 10*3/uL (ref 0.00–0.07)
Basophils Absolute: 0.1 10*3/uL (ref 0.0–0.1)
Basophils Relative: 1 %
Eosinophils Absolute: 0.2 10*3/uL (ref 0.0–0.5)
Eosinophils Relative: 3 %
HCT: 53.7 % — ABNORMAL HIGH (ref 39.0–52.0)
Hemoglobin: 16.8 g/dL (ref 13.0–17.0)
Immature Granulocytes: 0 %
Lymphocytes Relative: 22 %
Lymphs Abs: 1.9 10*3/uL (ref 0.7–4.0)
MCH: 25.8 pg — ABNORMAL LOW (ref 26.0–34.0)
MCHC: 31.3 g/dL (ref 30.0–36.0)
MCV: 82.4 fL (ref 80.0–100.0)
Monocytes Absolute: 0.8 10*3/uL (ref 0.1–1.0)
Monocytes Relative: 10 %
Neutro Abs: 5.7 10*3/uL (ref 1.7–7.7)
Neutrophils Relative %: 64 %
Platelets: 250 10*3/uL (ref 150–400)
RBC: 6.52 MIL/uL — ABNORMAL HIGH (ref 4.22–5.81)
RDW: 18.2 % — ABNORMAL HIGH (ref 11.5–15.5)
WBC: 8.8 10*3/uL (ref 4.0–10.5)
nRBC: 0 % (ref 0.0–0.2)

## 2019-06-29 LAB — IRON AND TIBC
Iron: 39 ug/dL — ABNORMAL LOW (ref 45–182)
Saturation Ratios: 8 % — ABNORMAL LOW (ref 17.9–39.5)
TIBC: 497 ug/dL — ABNORMAL HIGH (ref 250–450)
UIBC: 458 ug/dL

## 2019-06-29 LAB — FERRITIN: Ferritin: 10 ng/mL — ABNORMAL LOW (ref 24–336)

## 2019-06-29 NOTE — Progress Notes (Signed)
Patient stated that he had been doing well with no complaints. 

## 2019-08-30 ENCOUNTER — Inpatient Hospital Stay: Payer: PPO

## 2019-08-30 ENCOUNTER — Other Ambulatory Visit: Payer: Self-pay

## 2019-08-30 ENCOUNTER — Inpatient Hospital Stay: Payer: PPO | Attending: Oncology

## 2019-08-30 VITALS — BP 140/86 | HR 80 | Resp 20

## 2019-08-30 DIAGNOSIS — D751 Secondary polycythemia: Secondary | ICD-10-CM

## 2019-08-30 DIAGNOSIS — Z79899 Other long term (current) drug therapy: Secondary | ICD-10-CM | POA: Diagnosis not present

## 2019-08-30 DIAGNOSIS — D45 Polycythemia vera: Secondary | ICD-10-CM

## 2019-08-30 DIAGNOSIS — R5383 Other fatigue: Secondary | ICD-10-CM | POA: Diagnosis not present

## 2019-08-30 DIAGNOSIS — R531 Weakness: Secondary | ICD-10-CM | POA: Insufficient documentation

## 2019-08-30 LAB — CBC WITH DIFFERENTIAL/PLATELET
Abs Immature Granulocytes: 0.03 10*3/uL (ref 0.00–0.07)
Basophils Absolute: 0.1 10*3/uL (ref 0.0–0.1)
Basophils Relative: 1 %
Eosinophils Absolute: 0.2 10*3/uL (ref 0.0–0.5)
Eosinophils Relative: 3 %
HCT: 55.3 % — ABNORMAL HIGH (ref 39.0–52.0)
Hemoglobin: 17.4 g/dL — ABNORMAL HIGH (ref 13.0–17.0)
Immature Granulocytes: 0 %
Lymphocytes Relative: 25 %
Lymphs Abs: 2.3 10*3/uL (ref 0.7–4.0)
MCH: 26.3 pg (ref 26.0–34.0)
MCHC: 31.5 g/dL (ref 30.0–36.0)
MCV: 83.7 fL (ref 80.0–100.0)
Monocytes Absolute: 0.9 10*3/uL (ref 0.1–1.0)
Monocytes Relative: 10 %
Neutro Abs: 5.8 10*3/uL (ref 1.7–7.7)
Neutrophils Relative %: 61 %
Platelets: 263 10*3/uL (ref 150–400)
RBC: 6.61 MIL/uL — ABNORMAL HIGH (ref 4.22–5.81)
RDW: 18.2 % — ABNORMAL HIGH (ref 11.5–15.5)
WBC: 9.3 10*3/uL (ref 4.0–10.5)
nRBC: 0 % (ref 0.0–0.2)

## 2019-08-30 LAB — IRON AND TIBC
Iron: 35 ug/dL — ABNORMAL LOW (ref 45–182)
Saturation Ratios: 7 % — ABNORMAL LOW (ref 17.9–39.5)
TIBC: 474 ug/dL — ABNORMAL HIGH (ref 250–450)
UIBC: 439 ug/dL

## 2019-08-30 LAB — FERRITIN: Ferritin: 11 ng/mL — ABNORMAL LOW (ref 24–336)

## 2019-09-09 DIAGNOSIS — Z85828 Personal history of other malignant neoplasm of skin: Secondary | ICD-10-CM | POA: Diagnosis not present

## 2019-09-09 DIAGNOSIS — L570 Actinic keratosis: Secondary | ICD-10-CM | POA: Diagnosis not present

## 2019-09-09 DIAGNOSIS — D485 Neoplasm of uncertain behavior of skin: Secondary | ICD-10-CM | POA: Diagnosis not present

## 2019-09-09 DIAGNOSIS — D2261 Melanocytic nevi of right upper limb, including shoulder: Secondary | ICD-10-CM | POA: Diagnosis not present

## 2019-09-09 DIAGNOSIS — D225 Melanocytic nevi of trunk: Secondary | ICD-10-CM | POA: Diagnosis not present

## 2019-09-09 DIAGNOSIS — D2262 Melanocytic nevi of left upper limb, including shoulder: Secondary | ICD-10-CM | POA: Diagnosis not present
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
## 2019-10-26 ENCOUNTER — Other Ambulatory Visit: Payer: Self-pay

## 2019-10-26 NOTE — Progress Notes (Signed)
Patient pre screened for office appointment, no questions or concerns today. Patient reminded of upcoming appointment time and date. 

## 2019-10-27 ENCOUNTER — Inpatient Hospital Stay (HOSPITAL_BASED_OUTPATIENT_CLINIC_OR_DEPARTMENT_OTHER): Payer: PPO | Admitting: Oncology

## 2019-10-27 ENCOUNTER — Inpatient Hospital Stay: Payer: PPO | Attending: Oncology

## 2019-10-27 ENCOUNTER — Other Ambulatory Visit: Payer: Self-pay

## 2019-10-27 ENCOUNTER — Inpatient Hospital Stay: Payer: PPO

## 2019-10-27 VITALS — BP 148/56 | HR 90 | Temp 98.0°F | Resp 17 | Wt 242.6 lb

## 2019-10-27 VITALS — BP 144/78 | HR 77 | Resp 18

## 2019-10-27 DIAGNOSIS — Z79899 Other long term (current) drug therapy: Secondary | ICD-10-CM | POA: Diagnosis not present

## 2019-10-27 DIAGNOSIS — E039 Hypothyroidism, unspecified: Secondary | ICD-10-CM | POA: Insufficient documentation

## 2019-10-27 DIAGNOSIS — E785 Hyperlipidemia, unspecified: Secondary | ICD-10-CM | POA: Insufficient documentation

## 2019-10-27 DIAGNOSIS — D751 Secondary polycythemia: Secondary | ICD-10-CM | POA: Diagnosis not present

## 2019-10-27 DIAGNOSIS — R531 Weakness: Secondary | ICD-10-CM | POA: Insufficient documentation

## 2019-10-27 DIAGNOSIS — E669 Obesity, unspecified: Secondary | ICD-10-CM | POA: Insufficient documentation

## 2019-10-27 DIAGNOSIS — R5383 Other fatigue: Secondary | ICD-10-CM | POA: Insufficient documentation

## 2019-10-27 DIAGNOSIS — I10 Essential (primary) hypertension: Secondary | ICD-10-CM | POA: Insufficient documentation

## 2019-10-27 DIAGNOSIS — D45 Polycythemia vera: Secondary | ICD-10-CM

## 2019-10-27 LAB — CBC WITH DIFFERENTIAL/PLATELET
Abs Immature Granulocytes: 0.03 10*3/uL (ref 0.00–0.07)
Basophils Absolute: 0 10*3/uL (ref 0.0–0.1)
Basophils Relative: 1 %
Eosinophils Absolute: 0.2 10*3/uL (ref 0.0–0.5)
Eosinophils Relative: 3 %
HCT: 57.8 % — ABNORMAL HIGH (ref 39.0–52.0)
Hemoglobin: 17.8 g/dL — ABNORMAL HIGH (ref 13.0–17.0)
Immature Granulocytes: 0 %
Lymphocytes Relative: 25 %
Lymphs Abs: 1.9 10*3/uL (ref 0.7–4.0)
MCH: 26.6 pg (ref 26.0–34.0)
MCHC: 30.8 g/dL (ref 30.0–36.0)
MCV: 86.3 fL (ref 80.0–100.0)
Monocytes Absolute: 0.7 10*3/uL (ref 0.1–1.0)
Monocytes Relative: 10 %
Neutro Abs: 4.5 10*3/uL (ref 1.7–7.7)
Neutrophils Relative %: 61 %
Platelets: 250 10*3/uL (ref 150–400)
RBC: 6.7 MIL/uL — ABNORMAL HIGH (ref 4.22–5.81)
RDW: 17.2 % — ABNORMAL HIGH (ref 11.5–15.5)
WBC: 7.4 10*3/uL (ref 4.0–10.5)
nRBC: 0 % (ref 0.0–0.2)

## 2019-10-27 LAB — IRON AND TIBC
Iron: 62 ug/dL (ref 45–182)
Saturation Ratios: 13 % — ABNORMAL LOW (ref 17.9–39.5)
TIBC: 470 ug/dL — ABNORMAL HIGH (ref 250–450)
UIBC: 408 ug/dL

## 2019-10-27 LAB — FERRITIN: Ferritin: 13 ng/mL — ABNORMAL LOW (ref 24–336)

## 2019-10-27 NOTE — Progress Notes (Signed)
HGB 17.8, HCT 57.8.  therapeutic phlebotomy performed per MD order removing 500cc using 20 gauge PIV in left AC. Drink provided, pt declines snack. Pt tolerated procedure well. Pt and VS stable at discharge.

## 2019-12-09 DIAGNOSIS — Z79899 Other long term (current) drug therapy: Secondary | ICD-10-CM | POA: Diagnosis not present

## 2019-12-09 DIAGNOSIS — R739 Hyperglycemia, unspecified: Secondary | ICD-10-CM | POA: Diagnosis not present

## 2019-12-09 DIAGNOSIS — I10 Essential (primary) hypertension: Secondary | ICD-10-CM | POA: Diagnosis not present

## 2019-12-09 DIAGNOSIS — R7989 Other specified abnormal findings of blood chemistry: Secondary | ICD-10-CM | POA: Diagnosis not present

## 2019-12-09 DIAGNOSIS — E039 Hypothyroidism, unspecified: Secondary | ICD-10-CM | POA: Diagnosis not present

## 2019-12-13 DIAGNOSIS — Z1331 Encounter for screening for depression: Secondary | ICD-10-CM | POA: Diagnosis not present

## 2019-12-13 DIAGNOSIS — Z0000 Encounter for general adult medical examination without abnormal findings: Secondary | ICD-10-CM | POA: Diagnosis not present

## 2019-12-28 ENCOUNTER — Inpatient Hospital Stay: Payer: PPO | Attending: Oncology

## 2019-12-28 ENCOUNTER — Other Ambulatory Visit: Payer: Self-pay

## 2019-12-28 ENCOUNTER — Inpatient Hospital Stay: Payer: PPO

## 2019-12-28 VITALS — BP 152/77 | HR 77 | Temp 96.4°F | Resp 18

## 2019-12-28 DIAGNOSIS — R531 Weakness: Secondary | ICD-10-CM | POA: Insufficient documentation

## 2019-12-28 DIAGNOSIS — Z79899 Other long term (current) drug therapy: Secondary | ICD-10-CM | POA: Insufficient documentation

## 2019-12-28 DIAGNOSIS — D751 Secondary polycythemia: Secondary | ICD-10-CM | POA: Diagnosis not present

## 2019-12-28 DIAGNOSIS — R5383 Other fatigue: Secondary | ICD-10-CM | POA: Diagnosis not present

## 2019-12-28 DIAGNOSIS — D45 Polycythemia vera: Secondary | ICD-10-CM

## 2019-12-28 LAB — CBC WITH DIFFERENTIAL/PLATELET
Abs Immature Granulocytes: 0.04 10*3/uL (ref 0.00–0.07)
Basophils Absolute: 0.1 10*3/uL (ref 0.0–0.1)
Basophils Relative: 1 %
Eosinophils Absolute: 0.3 10*3/uL (ref 0.0–0.5)
Eosinophils Relative: 4 %
HCT: 54.4 % — ABNORMAL HIGH (ref 39.0–52.0)
Hemoglobin: 17.3 g/dL — ABNORMAL HIGH (ref 13.0–17.0)
Immature Granulocytes: 1 %
Lymphocytes Relative: 28 %
Lymphs Abs: 2.2 10*3/uL (ref 0.7–4.0)
MCH: 26.8 pg (ref 26.0–34.0)
MCHC: 31.8 g/dL (ref 30.0–36.0)
MCV: 84.2 fL (ref 80.0–100.0)
Monocytes Absolute: 0.7 10*3/uL (ref 0.1–1.0)
Monocytes Relative: 9 %
Neutro Abs: 4.5 10*3/uL (ref 1.7–7.7)
Neutrophils Relative %: 57 %
Platelets: 253 10*3/uL (ref 150–400)
RBC: 6.46 MIL/uL — ABNORMAL HIGH (ref 4.22–5.81)
RDW: 17.1 % — ABNORMAL HIGH (ref 11.5–15.5)
WBC: 7.8 10*3/uL (ref 4.0–10.5)
nRBC: 0 % (ref 0.0–0.2)

## 2019-12-28 LAB — IRON AND TIBC
Iron: 54 ug/dL (ref 45–182)
Saturation Ratios: 12 % — ABNORMAL LOW (ref 17.9–39.5)
TIBC: 466 ug/dL — ABNORMAL HIGH (ref 250–450)
UIBC: 412 ug/dL

## 2019-12-28 LAB — FERRITIN: Ferritin: 10 ng/mL — ABNORMAL LOW (ref 24–336)

## 2020-01-11 DIAGNOSIS — Z961 Presence of intraocular lens: Secondary | ICD-10-CM | POA: Diagnosis not present

## 2020-01-11 DIAGNOSIS — H3561 Retinal hemorrhage, right eye: Secondary | ICD-10-CM | POA: Diagnosis not present

## 2020-01-11 DIAGNOSIS — H43811 Vitreous degeneration, right eye: Secondary | ICD-10-CM | POA: Diagnosis not present

## 2020-01-11 DIAGNOSIS — Z135 Encounter for screening for eye and ear disorders: Secondary | ICD-10-CM | POA: Diagnosis not present
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
## 2020-02-24 ENCOUNTER — Inpatient Hospital Stay: Payer: PPO | Attending: Oncology

## 2020-02-24 ENCOUNTER — Inpatient Hospital Stay (HOSPITAL_BASED_OUTPATIENT_CLINIC_OR_DEPARTMENT_OTHER): Payer: PPO | Admitting: Oncology

## 2020-02-24 ENCOUNTER — Other Ambulatory Visit: Payer: Self-pay

## 2020-02-24 ENCOUNTER — Encounter: Payer: Self-pay | Admitting: Oncology

## 2020-02-24 ENCOUNTER — Inpatient Hospital Stay: Payer: PPO

## 2020-02-24 VITALS — BP 119/56 | HR 75

## 2020-02-24 DIAGNOSIS — Z801 Family history of malignant neoplasm of trachea, bronchus and lung: Secondary | ICD-10-CM | POA: Diagnosis not present

## 2020-02-24 DIAGNOSIS — D751 Secondary polycythemia: Secondary | ICD-10-CM

## 2020-02-24 DIAGNOSIS — Z791 Long term (current) use of non-steroidal anti-inflammatories (NSAID): Secondary | ICD-10-CM | POA: Diagnosis not present

## 2020-02-24 DIAGNOSIS — Z833 Family history of diabetes mellitus: Secondary | ICD-10-CM | POA: Diagnosis not present

## 2020-02-24 DIAGNOSIS — Z79899 Other long term (current) drug therapy: Secondary | ICD-10-CM | POA: Diagnosis not present

## 2020-02-24 DIAGNOSIS — E039 Hypothyroidism, unspecified: Secondary | ICD-10-CM | POA: Insufficient documentation

## 2020-02-24 DIAGNOSIS — R5383 Other fatigue: Secondary | ICD-10-CM | POA: Diagnosis not present

## 2020-02-24 DIAGNOSIS — Z800 Family history of malignant neoplasm of digestive organs: Secondary | ICD-10-CM | POA: Diagnosis not present

## 2020-02-24 DIAGNOSIS — E669 Obesity, unspecified: Secondary | ICD-10-CM | POA: Insufficient documentation

## 2020-02-24 DIAGNOSIS — R531 Weakness: Secondary | ICD-10-CM | POA: Insufficient documentation

## 2020-02-24 DIAGNOSIS — Z8349 Family history of other endocrine, nutritional and metabolic diseases: Secondary | ICD-10-CM | POA: Insufficient documentation

## 2020-02-24 DIAGNOSIS — E611 Iron deficiency: Secondary | ICD-10-CM | POA: Insufficient documentation

## 2020-02-24 DIAGNOSIS — Z8249 Family history of ischemic heart disease and other diseases of the circulatory system: Secondary | ICD-10-CM | POA: Diagnosis not present

## 2020-02-24 DIAGNOSIS — E785 Hyperlipidemia, unspecified: Secondary | ICD-10-CM | POA: Insufficient documentation

## 2020-02-24 DIAGNOSIS — D45 Polycythemia vera: Secondary | ICD-10-CM

## 2020-02-24 DIAGNOSIS — I10 Essential (primary) hypertension: Secondary | ICD-10-CM | POA: Diagnosis not present

## 2020-02-24 LAB — CBC WITH DIFFERENTIAL/PLATELET
Abs Immature Granulocytes: 0.01 10*3/uL (ref 0.00–0.07)
Basophils Absolute: 0.1 10*3/uL (ref 0.0–0.1)
Basophils Relative: 1 %
Eosinophils Absolute: 0.3 10*3/uL (ref 0.0–0.5)
Eosinophils Relative: 4 %
HCT: 51.8 % (ref 39.0–52.0)
Hemoglobin: 16.9 g/dL (ref 13.0–17.0)
Immature Granulocytes: 0 %
Lymphocytes Relative: 28 %
Lymphs Abs: 2.3 10*3/uL (ref 0.7–4.0)
MCH: 26.9 pg (ref 26.0–34.0)
MCHC: 32.6 g/dL (ref 30.0–36.0)
MCV: 82.4 fL (ref 80.0–100.0)
Monocytes Absolute: 1 10*3/uL (ref 0.1–1.0)
Monocytes Relative: 13 %
Neutro Abs: 4.4 10*3/uL (ref 1.7–7.7)
Neutrophils Relative %: 54 %
Platelets: 275 10*3/uL (ref 150–400)
RBC: 6.29 MIL/uL — ABNORMAL HIGH (ref 4.22–5.81)
RDW: 17.9 % — ABNORMAL HIGH (ref 11.5–15.5)
WBC: 8.1 10*3/uL (ref 4.0–10.5)
nRBC: 0 % (ref 0.0–0.2)

## 2020-02-24 LAB — IRON AND TIBC
Iron: 66 ug/dL (ref 45–182)
Saturation Ratios: 14 % — ABNORMAL LOW (ref 17.9–39.5)
TIBC: 490 ug/dL — ABNORMAL HIGH (ref 250–450)
UIBC: 424 ug/dL

## 2020-02-24 LAB — FERRITIN: Ferritin: 12 ng/mL — ABNORMAL LOW (ref 24–336)

## 2020-02-24 NOTE — Progress Notes (Signed)
Pt here for follow up. Reports feeling tired a lot. Otherwise no complaints.

## 2020-02-24 NOTE — Progress Notes (Signed)
Hemoglobin 16.9. Therapeutic phlebotomy performed per MD order. Removed 500cc using 20 gauge PIV in Right AC. Drink provided. Pt tolerated procedure well. No s/s noted, pt and VS stable at discharge.

## 2020-04-27 ENCOUNTER — Other Ambulatory Visit: Payer: Self-pay

## 2020-04-27 ENCOUNTER — Inpatient Hospital Stay: Payer: PPO

## 2020-04-27 ENCOUNTER — Inpatient Hospital Stay: Payer: PPO | Attending: Oncology

## 2020-04-27 DIAGNOSIS — D45 Polycythemia vera: Secondary | ICD-10-CM

## 2020-04-27 DIAGNOSIS — D751 Secondary polycythemia: Secondary | ICD-10-CM

## 2020-04-27 LAB — CBC WITH DIFFERENTIAL/PLATELET
Abs Immature Granulocytes: 0.02 10*3/uL (ref 0.00–0.07)
Basophils Absolute: 0.1 10*3/uL (ref 0.0–0.1)
Basophils Relative: 1 %
Eosinophils Absolute: 0.1 10*3/uL (ref 0.0–0.5)
Eosinophils Relative: 2 %
HCT: 52 % (ref 39.0–52.0)
Hemoglobin: 17.1 g/dL — ABNORMAL HIGH (ref 13.0–17.0)
Immature Granulocytes: 0 %
Lymphocytes Relative: 23 %
Lymphs Abs: 2.1 10*3/uL (ref 0.7–4.0)
MCH: 26.7 pg (ref 26.0–34.0)
MCHC: 32.9 g/dL (ref 30.0–36.0)
MCV: 81.3 fL (ref 80.0–100.0)
Monocytes Absolute: 0.7 10*3/uL (ref 0.1–1.0)
Monocytes Relative: 8 %
Neutro Abs: 6 10*3/uL (ref 1.7–7.7)
Neutrophils Relative %: 66 %
Platelets: 268 10*3/uL (ref 150–400)
RBC: 6.4 MIL/uL — ABNORMAL HIGH (ref 4.22–5.81)
RDW: 16.5 % — ABNORMAL HIGH (ref 11.5–15.5)
WBC: 9 10*3/uL (ref 4.0–10.5)
nRBC: 0 % (ref 0.0–0.2)

## 2020-04-27 LAB — IRON AND TIBC
Iron: 47 ug/dL (ref 45–182)
Saturation Ratios: 9 % — ABNORMAL LOW (ref 17.9–39.5)
TIBC: 519 ug/dL — ABNORMAL HIGH (ref 250–450)
UIBC: 472 ug/dL

## 2020-04-27 LAB — FERRITIN: Ferritin: 10 ng/mL — ABNORMAL LOW (ref 24–336)

## 2020-05-11 DIAGNOSIS — D225 Melanocytic nevi of trunk: Secondary | ICD-10-CM | POA: Diagnosis not present

## 2020-05-11 DIAGNOSIS — D2262 Melanocytic nevi of left upper limb, including shoulder: Secondary | ICD-10-CM | POA: Diagnosis not present

## 2020-05-11 DIAGNOSIS — L570 Actinic keratosis: Secondary | ICD-10-CM | POA: Diagnosis not present

## 2020-05-11 DIAGNOSIS — D485 Neoplasm of uncertain behavior of skin: Secondary | ICD-10-CM | POA: Diagnosis not present

## 2020-05-11 DIAGNOSIS — D2261 Melanocytic nevi of right upper limb, including shoulder: Secondary | ICD-10-CM | POA: Diagnosis not present

## 2020-05-11 DIAGNOSIS — Z85828 Personal history of other malignant neoplasm of skin: Secondary | ICD-10-CM | POA: Diagnosis not present

## 2020-05-11 DIAGNOSIS — C44329 Squamous cell carcinoma of skin of other parts of face: Secondary | ICD-10-CM | POA: Diagnosis not present

## 2020-05-11 DIAGNOSIS — L821 Other seborrheic keratosis: Secondary | ICD-10-CM | POA: Diagnosis not present

## 2020-05-17 DIAGNOSIS — C44329 Squamous cell carcinoma of skin of other parts of face: Secondary | ICD-10-CM | POA: Diagnosis not present

## 2020-06-05 DIAGNOSIS — E039 Hypothyroidism, unspecified: Secondary | ICD-10-CM | POA: Diagnosis not present

## 2020-06-05 DIAGNOSIS — I10 Essential (primary) hypertension: Secondary | ICD-10-CM | POA: Diagnosis not present

## 2020-06-05 DIAGNOSIS — Z79899 Other long term (current) drug therapy: Secondary | ICD-10-CM | POA: Diagnosis not present

## 2020-06-05 DIAGNOSIS — R739 Hyperglycemia, unspecified: Secondary | ICD-10-CM | POA: Diagnosis not present

## 2020-06-05 DIAGNOSIS — Z125 Encounter for screening for malignant neoplasm of prostate: Secondary | ICD-10-CM | POA: Diagnosis not present

## 2020-06-05 DIAGNOSIS — R7989 Other specified abnormal findings of blood chemistry: Secondary | ICD-10-CM | POA: Diagnosis not present

## 2020-06-12 DIAGNOSIS — Z79899 Other long term (current) drug therapy: Secondary | ICD-10-CM | POA: Diagnosis not present

## 2020-06-12 DIAGNOSIS — N1831 Chronic kidney disease, stage 3a: Secondary | ICD-10-CM | POA: Diagnosis not present

## 2020-06-12 DIAGNOSIS — E039 Hypothyroidism, unspecified: Secondary | ICD-10-CM | POA: Diagnosis not present

## 2020-06-12 DIAGNOSIS — R7989 Other specified abnormal findings of blood chemistry: Secondary | ICD-10-CM | POA: Diagnosis not present

## 2020-06-12 DIAGNOSIS — R739 Hyperglycemia, unspecified: Secondary | ICD-10-CM | POA: Diagnosis not present

## 2020-06-12 DIAGNOSIS — I10 Essential (primary) hypertension: Secondary | ICD-10-CM | POA: Diagnosis not present
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
## 2020-06-30 ENCOUNTER — Inpatient Hospital Stay: Payer: PPO

## 2020-06-30 ENCOUNTER — Inpatient Hospital Stay: Payer: PPO | Attending: Oncology

## 2020-06-30 ENCOUNTER — Encounter: Payer: Self-pay | Admitting: Oncology

## 2020-06-30 ENCOUNTER — Other Ambulatory Visit: Payer: Self-pay

## 2020-06-30 ENCOUNTER — Inpatient Hospital Stay (HOSPITAL_BASED_OUTPATIENT_CLINIC_OR_DEPARTMENT_OTHER): Payer: PPO | Admitting: Oncology

## 2020-06-30 VITALS — BP 134/65 | HR 75 | Temp 98.5°F | Wt 251.2 lb

## 2020-06-30 DIAGNOSIS — I10 Essential (primary) hypertension: Secondary | ICD-10-CM | POA: Diagnosis not present

## 2020-06-30 DIAGNOSIS — D751 Secondary polycythemia: Secondary | ICD-10-CM | POA: Insufficient documentation

## 2020-06-30 DIAGNOSIS — D45 Polycythemia vera: Secondary | ICD-10-CM

## 2020-06-30 DIAGNOSIS — Z801 Family history of malignant neoplasm of trachea, bronchus and lung: Secondary | ICD-10-CM | POA: Insufficient documentation

## 2020-06-30 DIAGNOSIS — Z8249 Family history of ischemic heart disease and other diseases of the circulatory system: Secondary | ICD-10-CM | POA: Insufficient documentation

## 2020-06-30 DIAGNOSIS — Z79899 Other long term (current) drug therapy: Secondary | ICD-10-CM | POA: Diagnosis not present

## 2020-06-30 DIAGNOSIS — Z803 Family history of malignant neoplasm of breast: Secondary | ICD-10-CM | POA: Diagnosis not present

## 2020-06-30 DIAGNOSIS — E669 Obesity, unspecified: Secondary | ICD-10-CM | POA: Insufficient documentation

## 2020-06-30 DIAGNOSIS — Z800 Family history of malignant neoplasm of digestive organs: Secondary | ICD-10-CM | POA: Insufficient documentation

## 2020-06-30 DIAGNOSIS — R5383 Other fatigue: Secondary | ICD-10-CM | POA: Insufficient documentation

## 2020-06-30 DIAGNOSIS — E785 Hyperlipidemia, unspecified: Secondary | ICD-10-CM | POA: Diagnosis not present

## 2020-06-30 DIAGNOSIS — Z8349 Family history of other endocrine, nutritional and metabolic diseases: Secondary | ICD-10-CM | POA: Diagnosis not present

## 2020-06-30 DIAGNOSIS — Z808 Family history of malignant neoplasm of other organs or systems: Secondary | ICD-10-CM | POA: Insufficient documentation

## 2020-06-30 DIAGNOSIS — E611 Iron deficiency: Secondary | ICD-10-CM | POA: Insufficient documentation

## 2020-06-30 DIAGNOSIS — Z833 Family history of diabetes mellitus: Secondary | ICD-10-CM | POA: Insufficient documentation

## 2020-06-30 DIAGNOSIS — E039 Hypothyroidism, unspecified: Secondary | ICD-10-CM | POA: Insufficient documentation

## 2020-06-30 LAB — CBC WITH DIFFERENTIAL/PLATELET
Abs Immature Granulocytes: 0.02 10*3/uL (ref 0.00–0.07)
Basophils Absolute: 0.1 10*3/uL (ref 0.0–0.1)
Basophils Relative: 1 %
Eosinophils Absolute: 0.3 10*3/uL (ref 0.0–0.5)
Eosinophils Relative: 4 %
HCT: 50.3 % (ref 39.0–52.0)
Hemoglobin: 16.5 g/dL (ref 13.0–17.0)
Immature Granulocytes: 0 %
Lymphocytes Relative: 26 %
Lymphs Abs: 2 10*3/uL (ref 0.7–4.0)
MCH: 26.9 pg (ref 26.0–34.0)
MCHC: 32.8 g/dL (ref 30.0–36.0)
MCV: 81.9 fL (ref 80.0–100.0)
Monocytes Absolute: 1 10*3/uL (ref 0.1–1.0)
Monocytes Relative: 12 %
Neutro Abs: 4.5 10*3/uL (ref 1.7–7.7)
Neutrophils Relative %: 57 %
Platelets: 267 10*3/uL (ref 150–400)
RBC: 6.14 MIL/uL — ABNORMAL HIGH (ref 4.22–5.81)
RDW: 17.9 % — ABNORMAL HIGH (ref 11.5–15.5)
WBC: 7.8 10*3/uL (ref 4.0–10.5)
nRBC: 0 % (ref 0.0–0.2)

## 2020-06-30 LAB — IRON AND TIBC
Iron: 48 ug/dL (ref 45–182)
Saturation Ratios: 9 % — ABNORMAL LOW (ref 17.9–39.5)
TIBC: 525 ug/dL — ABNORMAL HIGH (ref 250–450)
UIBC: 477 ug/dL

## 2020-06-30 LAB — FERRITIN: Ferritin: 9 ng/mL — ABNORMAL LOW (ref 24–336)

## 2020-06-30 NOTE — Progress Notes (Signed)
Patient denies any pain or concerns at this time.  

## 2020-06-30 NOTE — Progress Notes (Signed)
Per Dr Grayland Ormond, 500cc of blood removed from pt via therapeutic phlebotomy. Pt tolerated well. VSS at discharge.

## 2020-08-30 ENCOUNTER — Inpatient Hospital Stay: Payer: PPO | Attending: Oncology

## 2020-08-30 ENCOUNTER — Inpatient Hospital Stay: Payer: PPO

## 2020-08-30 ENCOUNTER — Other Ambulatory Visit: Payer: Self-pay

## 2020-08-30 DIAGNOSIS — I10 Essential (primary) hypertension: Secondary | ICD-10-CM | POA: Insufficient documentation

## 2020-08-30 DIAGNOSIS — D45 Polycythemia vera: Secondary | ICD-10-CM

## 2020-08-30 DIAGNOSIS — Z79899 Other long term (current) drug therapy: Secondary | ICD-10-CM | POA: Diagnosis not present

## 2020-08-30 DIAGNOSIS — D751 Secondary polycythemia: Secondary | ICD-10-CM | POA: Insufficient documentation

## 2020-08-30 DIAGNOSIS — E611 Iron deficiency: Secondary | ICD-10-CM | POA: Insufficient documentation

## 2020-08-30 DIAGNOSIS — E039 Hypothyroidism, unspecified: Secondary | ICD-10-CM | POA: Insufficient documentation

## 2020-08-30 LAB — CBC WITH DIFFERENTIAL/PLATELET
Abs Immature Granulocytes: 0.03 10*3/uL (ref 0.00–0.07)
Basophils Absolute: 0.1 10*3/uL (ref 0.0–0.1)
Basophils Relative: 1 %
Eosinophils Absolute: 0.3 10*3/uL (ref 0.0–0.5)
Eosinophils Relative: 3 %
HCT: 48.5 % (ref 39.0–52.0)
Hemoglobin: 15.8 g/dL (ref 13.0–17.0)
Immature Granulocytes: 0 %
Lymphocytes Relative: 25 %
Lymphs Abs: 2.3 10*3/uL (ref 0.7–4.0)
MCH: 26.8 pg (ref 26.0–34.0)
MCHC: 32.6 g/dL (ref 30.0–36.0)
MCV: 82.3 fL (ref 80.0–100.0)
Monocytes Absolute: 0.6 10*3/uL (ref 0.1–1.0)
Monocytes Relative: 7 %
Neutro Abs: 5.8 10*3/uL (ref 1.7–7.7)
Neutrophils Relative %: 64 %
Platelets: 255 10*3/uL (ref 150–400)
RBC: 5.89 MIL/uL — ABNORMAL HIGH (ref 4.22–5.81)
RDW: 16 % — ABNORMAL HIGH (ref 11.5–15.5)
WBC: 9.1 10*3/uL (ref 4.0–10.5)
nRBC: 0 % (ref 0.0–0.2)

## 2020-08-30 LAB — IRON AND TIBC
Iron: 36 ug/dL — ABNORMAL LOW (ref 45–182)
Saturation Ratios: 7 % — ABNORMAL LOW (ref 17.9–39.5)
TIBC: 491 ug/dL — ABNORMAL HIGH (ref 250–450)
UIBC: 455 ug/dL

## 2020-08-30 LAB — FERRITIN: Ferritin: 6 ng/mL — ABNORMAL LOW (ref 24–336)

## 2020-08-30 NOTE — Progress Notes (Signed)
Pt.'s hgb 15.8. Per parameters, pt does not need phlebotomy today. Pt updated and all questions answered at this time. Appointments given to patient.   Cheridan Kibler [employer]

## 2020-10-25 ENCOUNTER — Other Ambulatory Visit: Payer: Self-pay

## 2020-10-25 ENCOUNTER — Inpatient Hospital Stay: Payer: PPO

## 2020-10-25 ENCOUNTER — Inpatient Hospital Stay: Payer: PPO | Attending: Oncology

## 2020-10-25 VITALS — BP 155/76 | HR 74 | Temp 98.2°F | Resp 18

## 2020-10-25 DIAGNOSIS — D751 Secondary polycythemia: Secondary | ICD-10-CM | POA: Diagnosis not present

## 2020-10-25 DIAGNOSIS — R7989 Other specified abnormal findings of blood chemistry: Secondary | ICD-10-CM | POA: Diagnosis not present

## 2020-10-25 DIAGNOSIS — D45 Polycythemia vera: Secondary | ICD-10-CM

## 2020-10-25 LAB — CBC WITH DIFFERENTIAL/PLATELET
Abs Immature Granulocytes: 0.02 10*3/uL (ref 0.00–0.07)
Basophils Absolute: 0.1 10*3/uL (ref 0.0–0.1)
Basophils Relative: 1 %
Eosinophils Absolute: 0.3 10*3/uL (ref 0.0–0.5)
Eosinophils Relative: 3 %
HCT: 52.9 % — ABNORMAL HIGH (ref 39.0–52.0)
Hemoglobin: 17 g/dL (ref 13.0–17.0)
Immature Granulocytes: 0 %
Lymphocytes Relative: 27 %
Lymphs Abs: 2.5 10*3/uL (ref 0.7–4.0)
MCH: 26.7 pg (ref 26.0–34.0)
MCHC: 32.1 g/dL (ref 30.0–36.0)
MCV: 83 fL (ref 80.0–100.0)
Monocytes Absolute: 0.9 10*3/uL (ref 0.1–1.0)
Monocytes Relative: 10 %
Neutro Abs: 5.4 10*3/uL (ref 1.7–7.7)
Neutrophils Relative %: 59 %
Platelets: 265 10*3/uL (ref 150–400)
RBC: 6.37 MIL/uL — ABNORMAL HIGH (ref 4.22–5.81)
RDW: 18.6 % — ABNORMAL HIGH (ref 11.5–15.5)
WBC: 9.1 10*3/uL (ref 4.0–10.5)
nRBC: 0 % (ref 0.0–0.2)

## 2020-10-25 LAB — IRON AND TIBC
Iron: 35 ug/dL — ABNORMAL LOW (ref 45–182)
Saturation Ratios: 7 % — ABNORMAL LOW (ref 17.9–39.5)
TIBC: 526 ug/dL — ABNORMAL HIGH (ref 250–450)
UIBC: 491 ug/dL

## 2020-10-25 LAB — FERRITIN: Ferritin: 8 ng/mL — ABNORMAL LOW (ref 24–336)

## 2020-10-25 NOTE — Progress Notes (Signed)
Patient tolerated Phlebotomy well. Declined post observation. Patient and VSS. Discharged home.

## 2020-10-30 DIAGNOSIS — D2262 Melanocytic nevi of left upper limb, including shoulder: Secondary | ICD-10-CM | POA: Diagnosis not present

## 2020-10-30 DIAGNOSIS — L570 Actinic keratosis: Secondary | ICD-10-CM | POA: Diagnosis not present

## 2020-10-30 DIAGNOSIS — D225 Melanocytic nevi of trunk: Secondary | ICD-10-CM | POA: Diagnosis not present

## 2020-10-30 DIAGNOSIS — D2261 Melanocytic nevi of right upper limb, including shoulder: Secondary | ICD-10-CM | POA: Diagnosis not present

## 2020-10-30 DIAGNOSIS — Z85828 Personal history of other malignant neoplasm of skin: Secondary | ICD-10-CM | POA: Diagnosis not present

## 2020-10-30 DIAGNOSIS — D2272 Melanocytic nevi of left lower limb, including hip: Secondary | ICD-10-CM | POA: Diagnosis not present

## 2020-12-07 DIAGNOSIS — Z79899 Other long term (current) drug therapy: Secondary | ICD-10-CM | POA: Diagnosis not present

## 2020-12-07 DIAGNOSIS — R7989 Other specified abnormal findings of blood chemistry: Secondary | ICD-10-CM | POA: Diagnosis not present

## 2020-12-07 DIAGNOSIS — I10 Essential (primary) hypertension: Secondary | ICD-10-CM | POA: Diagnosis not present

## 2020-12-07 DIAGNOSIS — R739 Hyperglycemia, unspecified: Secondary | ICD-10-CM | POA: Diagnosis not present

## 2020-12-07 DIAGNOSIS — E039 Hypothyroidism, unspecified: Secondary | ICD-10-CM | POA: Diagnosis not present

## 2020-12-14 DIAGNOSIS — Z1331 Encounter for screening for depression: Secondary | ICD-10-CM | POA: Diagnosis not present

## 2020-12-14 DIAGNOSIS — E1122 Type 2 diabetes mellitus with diabetic chronic kidney disease: Secondary | ICD-10-CM | POA: Diagnosis not present

## 2020-12-14 DIAGNOSIS — Z0000 Encounter for general adult medical examination without abnormal findings: Secondary | ICD-10-CM | POA: Diagnosis not present

## 2020-12-14 DIAGNOSIS — N1831 Chronic kidney disease, stage 3a: Secondary | ICD-10-CM | POA: Diagnosis not present
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
## 2021-01-01 ENCOUNTER — Inpatient Hospital Stay: Payer: PPO | Attending: Oncology

## 2021-01-01 DIAGNOSIS — D751 Secondary polycythemia: Secondary | ICD-10-CM | POA: Insufficient documentation

## 2021-01-01 DIAGNOSIS — E611 Iron deficiency: Secondary | ICD-10-CM | POA: Diagnosis not present

## 2021-01-01 DIAGNOSIS — Z79899 Other long term (current) drug therapy: Secondary | ICD-10-CM | POA: Diagnosis not present

## 2021-01-01 DIAGNOSIS — D45 Polycythemia vera: Secondary | ICD-10-CM

## 2021-01-01 LAB — IRON AND TIBC
Iron: 28 ug/dL — ABNORMAL LOW (ref 45–182)
Saturation Ratios: 5 % — ABNORMAL LOW (ref 17.9–39.5)
TIBC: 535 ug/dL — ABNORMAL HIGH (ref 250–450)
UIBC: 507 ug/dL

## 2021-01-01 LAB — CBC WITH DIFFERENTIAL/PLATELET
Abs Immature Granulocytes: 0.04 10*3/uL (ref 0.00–0.07)
Basophils Absolute: 0.1 10*3/uL (ref 0.0–0.1)
Basophils Relative: 1 %
Eosinophils Absolute: 0.2 10*3/uL (ref 0.0–0.5)
Eosinophils Relative: 2 %
HCT: 53.4 % — ABNORMAL HIGH (ref 39.0–52.0)
Hemoglobin: 17.1 g/dL — ABNORMAL HIGH (ref 13.0–17.0)
Immature Granulocytes: 0 %
Lymphocytes Relative: 23 %
Lymphs Abs: 2.2 10*3/uL (ref 0.7–4.0)
MCH: 26 pg (ref 26.0–34.0)
MCHC: 32 g/dL (ref 30.0–36.0)
MCV: 81.2 fL (ref 80.0–100.0)
Monocytes Absolute: 1 10*3/uL (ref 0.1–1.0)
Monocytes Relative: 11 %
Neutro Abs: 5.9 10*3/uL (ref 1.7–7.7)
Neutrophils Relative %: 63 %
Platelets: 272 10*3/uL (ref 150–400)
RBC: 6.58 MIL/uL — ABNORMAL HIGH (ref 4.22–5.81)
RDW: 18.2 % — ABNORMAL HIGH (ref 11.5–15.5)
WBC: 9.4 10*3/uL (ref 4.0–10.5)
nRBC: 0 % (ref 0.0–0.2)

## 2021-01-01 LAB — FERRITIN: Ferritin: 8 ng/mL — ABNORMAL LOW (ref 24–336)

## 2021-01-02 ENCOUNTER — Inpatient Hospital Stay: Payer: PPO

## 2021-01-02 ENCOUNTER — Inpatient Hospital Stay (HOSPITAL_BASED_OUTPATIENT_CLINIC_OR_DEPARTMENT_OTHER): Payer: PPO | Admitting: Oncology

## 2021-01-02 ENCOUNTER — Encounter: Payer: Self-pay | Admitting: Oncology

## 2021-01-02 VITALS — BP 150/77 | HR 87 | Temp 97.1°F | Resp 18 | Wt 248.5 lb

## 2021-01-02 DIAGNOSIS — D751 Secondary polycythemia: Secondary | ICD-10-CM

## 2021-01-02 NOTE — Progress Notes (Signed)
Performed 500 ml phlebotomy. VSS at discharge.

## 2021-01-02 NOTE — Progress Notes (Unsigned)
Pt in for follow up and lab results today.

## 2021-01-22 DIAGNOSIS — N1831 Chronic kidney disease, stage 3a: Secondary | ICD-10-CM | POA: Diagnosis not present

## 2021-03-05 ENCOUNTER — Inpatient Hospital Stay: Payer: PPO | Attending: Oncology

## 2021-03-05 ENCOUNTER — Other Ambulatory Visit: Payer: Self-pay

## 2021-03-05 ENCOUNTER — Inpatient Hospital Stay: Payer: PPO

## 2021-03-05 DIAGNOSIS — D751 Secondary polycythemia: Secondary | ICD-10-CM | POA: Diagnosis not present

## 2021-03-05 DIAGNOSIS — E611 Iron deficiency: Secondary | ICD-10-CM | POA: Insufficient documentation

## 2021-03-05 DIAGNOSIS — D45 Polycythemia vera: Secondary | ICD-10-CM

## 2021-03-05 LAB — CBC WITH DIFFERENTIAL/PLATELET
Abs Immature Granulocytes: 0.05 10*3/uL (ref 0.00–0.07)
Basophils Absolute: 0.1 10*3/uL (ref 0.0–0.1)
Basophils Relative: 1 %
Eosinophils Absolute: 0.3 10*3/uL (ref 0.0–0.5)
Eosinophils Relative: 3 %
HCT: 54.8 % — ABNORMAL HIGH (ref 39.0–52.0)
Hemoglobin: 17.5 g/dL — ABNORMAL HIGH (ref 13.0–17.0)
Immature Granulocytes: 1 %
Lymphocytes Relative: 22 %
Lymphs Abs: 2.4 10*3/uL (ref 0.7–4.0)
MCH: 25.8 pg — ABNORMAL LOW (ref 26.0–34.0)
MCHC: 31.9 g/dL (ref 30.0–36.0)
MCV: 80.9 fL (ref 80.0–100.0)
Monocytes Absolute: 1.4 10*3/uL — ABNORMAL HIGH (ref 0.1–1.0)
Monocytes Relative: 13 %
Neutro Abs: 6.4 10*3/uL (ref 1.7–7.7)
Neutrophils Relative %: 60 %
Platelets: 297 10*3/uL (ref 150–400)
RBC: 6.77 MIL/uL — ABNORMAL HIGH (ref 4.22–5.81)
RDW: 18.5 % — ABNORMAL HIGH (ref 11.5–15.5)
WBC: 10.6 10*3/uL — ABNORMAL HIGH (ref 4.0–10.5)
nRBC: 0 % (ref 0.0–0.2)

## 2021-03-05 LAB — IRON AND TIBC
Iron: 44 ug/dL — ABNORMAL LOW (ref 45–182)
Saturation Ratios: 8 % — ABNORMAL LOW (ref 17.9–39.5)
TIBC: 560 ug/dL — ABNORMAL HIGH (ref 250–450)
UIBC: 516 ug/dL

## 2021-03-05 LAB — FERRITIN: Ferritin: 9 ng/mL — ABNORMAL LOW (ref 24–336)

## 2021-03-05 NOTE — Progress Notes (Signed)
500 ml phlebotomy completed. VSS. Declined a drink or snack. Discharged to home stating he" feels fine".

## 2021-05-04 DIAGNOSIS — D485 Neoplasm of uncertain behavior of skin: Secondary | ICD-10-CM | POA: Diagnosis not present

## 2021-05-04 DIAGNOSIS — C44311 Basal cell carcinoma of skin of nose: Secondary | ICD-10-CM | POA: Diagnosis not present

## 2021-05-04 DIAGNOSIS — X32XXXA Exposure to sunlight, initial encounter: Secondary | ICD-10-CM | POA: Diagnosis not present

## 2021-05-04 DIAGNOSIS — L570 Actinic keratosis: Secondary | ICD-10-CM | POA: Diagnosis not present

## 2021-05-07 ENCOUNTER — Inpatient Hospital Stay: Payer: PPO | Attending: Oncology

## 2021-05-07 ENCOUNTER — Inpatient Hospital Stay: Payer: PPO

## 2021-05-07 ENCOUNTER — Other Ambulatory Visit: Payer: Self-pay | Admitting: Oncology

## 2021-05-07 DIAGNOSIS — E611 Iron deficiency: Secondary | ICD-10-CM | POA: Insufficient documentation

## 2021-05-07 DIAGNOSIS — D751 Secondary polycythemia: Secondary | ICD-10-CM | POA: Insufficient documentation

## 2021-05-07 LAB — IRON AND TIBC
Iron: 55 ug/dL (ref 45–182)
Saturation Ratios: 10 % — ABNORMAL LOW (ref 17.9–39.5)
TIBC: 571 ug/dL — ABNORMAL HIGH (ref 250–450)
UIBC: 516 ug/dL

## 2021-05-07 LAB — CBC WITH DIFFERENTIAL/PLATELET
Abs Immature Granulocytes: 0.02 10*3/uL (ref 0.00–0.07)
Basophils Absolute: 0.1 10*3/uL (ref 0.0–0.1)
Basophils Relative: 1 %
Eosinophils Absolute: 0.2 10*3/uL (ref 0.0–0.5)
Eosinophils Relative: 3 %
HCT: 54.3 % — ABNORMAL HIGH (ref 39.0–52.0)
Hemoglobin: 17.3 g/dL — ABNORMAL HIGH (ref 13.0–17.0)
Immature Granulocytes: 0 %
Lymphocytes Relative: 25 %
Lymphs Abs: 2.1 10*3/uL (ref 0.7–4.0)
MCH: 26 pg (ref 26.0–34.0)
MCHC: 31.9 g/dL (ref 30.0–36.0)
MCV: 81.5 fL (ref 80.0–100.0)
Monocytes Absolute: 0.8 10*3/uL (ref 0.1–1.0)
Monocytes Relative: 9 %
Neutro Abs: 5.4 10*3/uL (ref 1.7–7.7)
Neutrophils Relative %: 62 %
Platelets: 259 10*3/uL (ref 150–400)
RBC: 6.66 MIL/uL — ABNORMAL HIGH (ref 4.22–5.81)
RDW: 18.4 % — ABNORMAL HIGH (ref 11.5–15.5)
WBC: 8.6 10*3/uL (ref 4.0–10.5)
nRBC: 0 % (ref 0.0–0.2)

## 2021-05-07 LAB — FERRITIN: Ferritin: 11 ng/mL — ABNORMAL LOW (ref 24–336)

## 2021-05-07 NOTE — Patient Instructions (Signed)
[facility]   Discharge Instructions: Thank you for choosing [facility] to provide your oncology and hematology care.  If you have a lab appointment with the [facility], please go directly to the [facility] and check in at the registration area.  Wear comfortable clothing and clothing appropriate for easy access to any Portacath or PICC line.   We strive to give you quality time with your provider. You may need to reschedule your appointment if you arrive late (15 or more minutes).  Arriving late affects you and other patients whose appointments are after yours.  Also, if you miss three or more appointments without notifying the office, you may be dismissed from the clinic at the provider's discretion.      For prescription refill requests, have your pharmacy contact our office and allow 72 hours for refills to be completed.    Today you received a phlebotomy  To help prevent nausea and vomiting after your treatment, we encourage you to take your nausea medication as directed.  BELOW ARE SYMPTOMS THAT SHOULD BE REPORTED IMMEDIATELY: *FEVER GREATER THAN 100.4 F (38 C) OR HIGHER *CHILLS OR SWEATING *NAUSEA AND VOMITING THAT IS NOT CONTROLLED WITH YOUR NAUSEA MEDICATION *UNUSUAL SHORTNESS OF BREATH *UNUSUAL BRUISING OR BLEEDING *URINARY PROBLEMS (pain or burning when urinating, or frequent urination) *BOWEL PROBLEMS (unusual diarrhea, constipation, pain near the anus) TENDERNESS IN MOUTH AND THROAT WITH OR WITHOUT PRESENCE OF ULCERS (sore throat, sores in mouth, or a toothache) UNUSUAL RASH, SWELLING OR PAIN  UNUSUAL VAGINAL DISCHARGE OR ITCHING   Items with * indicate a potential emergency and should be followed up as soon as possible or go to the Emergency Department if any problems should occur.  Please show the CHEMOTHERAPY ALERT CARD or IMMUNOTHERAPY ALERT CARD at check-in to the Emergency Department and triage nurse.  Should  you have questions after your visit or need to cancel or reschedule your appointment, please contact [facility]  [phone] and follow the prompts.  Office hours are 8:00 a.m. to 4:30 p.m. Monday - Friday. Please note that voicemails left after 4:00 p.m. may not be returned until the following business day.  We are closed weekends and major holidays. You have access to a nurse at all times for urgent questions. Please call the main number to the clinic [phone] and follow the prompts.  For any non-urgent questions, you may also contact your provider using MyChart. We now offer e-Visits for anyone 5 and older to request care online for non-urgent symptoms. For details visit [email or web address].   Also download the MyChart app! Go to the app store, search "MyChart", open the app, select [facility], and log in with your MyChart username and password.  Due to Covid, a mask is required upon entering the hospital/clinic. If you do not have a mask, one will be given to you upon arrival. For doctor visits, patients may have 1 support person aged 3 or older with them. For treatment visits, patients cannot have anyone with them due to current Covid guidelines and our immunocompromised population.

## 2021-05-14 DIAGNOSIS — Z961 Presence of intraocular lens: Secondary | ICD-10-CM | POA: Diagnosis not present

## 2021-05-14 DIAGNOSIS — H3561 Retinal hemorrhage, right eye: Secondary | ICD-10-CM | POA: Diagnosis not present

## 2021-05-14 DIAGNOSIS — H35372 Puckering of macula, left eye: Secondary | ICD-10-CM | POA: Diagnosis not present

## 2021-05-14 DIAGNOSIS — H26492 Other secondary cataract, left eye: Secondary | ICD-10-CM | POA: Diagnosis not present

## 2021-05-14 DIAGNOSIS — H43811 Vitreous degeneration, right eye: Secondary | ICD-10-CM | POA: Diagnosis not present

## 2021-06-07 DIAGNOSIS — Z79899 Other long term (current) drug therapy: Secondary | ICD-10-CM | POA: Diagnosis not present

## 2021-06-07 DIAGNOSIS — R739 Hyperglycemia, unspecified: Secondary | ICD-10-CM | POA: Diagnosis not present

## 2021-06-07 DIAGNOSIS — R7989 Other specified abnormal findings of blood chemistry: Secondary | ICD-10-CM | POA: Diagnosis not present

## 2021-06-07 DIAGNOSIS — Z125 Encounter for screening for malignant neoplasm of prostate: Secondary | ICD-10-CM | POA: Diagnosis not present

## 2021-06-07 DIAGNOSIS — E039 Hypothyroidism, unspecified: Secondary | ICD-10-CM | POA: Diagnosis not present

## 2021-06-07 DIAGNOSIS — I10 Essential (primary) hypertension: Secondary | ICD-10-CM | POA: Diagnosis not present

## 2021-06-14 DIAGNOSIS — E291 Testicular hypofunction: Secondary | ICD-10-CM | POA: Diagnosis not present

## 2021-06-14 DIAGNOSIS — D751 Secondary polycythemia: Secondary | ICD-10-CM | POA: Diagnosis not present

## 2021-06-14 DIAGNOSIS — R739 Hyperglycemia, unspecified: Secondary | ICD-10-CM | POA: Diagnosis not present

## 2021-06-14 DIAGNOSIS — Z79899 Other long term (current) drug therapy: Secondary | ICD-10-CM | POA: Diagnosis not present

## 2021-06-14 DIAGNOSIS — N1831 Chronic kidney disease, stage 3a: Secondary | ICD-10-CM | POA: Diagnosis not present

## 2021-06-14 DIAGNOSIS — E1122 Type 2 diabetes mellitus with diabetic chronic kidney disease: Secondary | ICD-10-CM | POA: Diagnosis not present

## 2021-06-14 DIAGNOSIS — I10 Essential (primary) hypertension: Secondary | ICD-10-CM | POA: Diagnosis not present

## 2021-06-14 DIAGNOSIS — E039 Hypothyroidism, unspecified: Secondary | ICD-10-CM | POA: Diagnosis not present

## 2021-06-28 ENCOUNTER — Other Ambulatory Visit: Payer: Self-pay

## 2021-06-28 DIAGNOSIS — D751 Secondary polycythemia: Secondary | ICD-10-CM

## 2021-06-29 ENCOUNTER — Inpatient Hospital Stay: Payer: PPO | Attending: Oncology

## 2021-06-29 DIAGNOSIS — E291 Testicular hypofunction: Secondary | ICD-10-CM | POA: Insufficient documentation

## 2021-06-29 DIAGNOSIS — Z79890 Hormone replacement therapy (postmenopausal): Secondary | ICD-10-CM | POA: Insufficient documentation

## 2021-06-29 DIAGNOSIS — D751 Secondary polycythemia: Secondary | ICD-10-CM | POA: Insufficient documentation

## 2021-06-29 DIAGNOSIS — Z79899 Other long term (current) drug therapy: Secondary | ICD-10-CM | POA: Insufficient documentation

## 2021-06-29 DIAGNOSIS — I10 Essential (primary) hypertension: Secondary | ICD-10-CM | POA: Insufficient documentation

## 2021-06-29 DIAGNOSIS — E611 Iron deficiency: Secondary | ICD-10-CM | POA: Insufficient documentation

## 2021-06-29 DIAGNOSIS — E669 Obesity, unspecified: Secondary | ICD-10-CM | POA: Diagnosis not present

## 2021-06-29 DIAGNOSIS — E785 Hyperlipidemia, unspecified: Secondary | ICD-10-CM | POA: Insufficient documentation

## 2021-06-29 DIAGNOSIS — E039 Hypothyroidism, unspecified: Secondary | ICD-10-CM | POA: Insufficient documentation

## 2021-06-29 LAB — CBC WITH DIFFERENTIAL/PLATELET
Abs Immature Granulocytes: 0.02 10*3/uL (ref 0.00–0.07)
Basophils Absolute: 0.1 10*3/uL (ref 0.0–0.1)
Basophils Relative: 1 %
Eosinophils Absolute: 0.3 10*3/uL (ref 0.0–0.5)
Eosinophils Relative: 4 %
HCT: 51.1 % (ref 39.0–52.0)
Hemoglobin: 15.6 g/dL (ref 13.0–17.0)
Immature Granulocytes: 0 %
Lymphocytes Relative: 23 %
Lymphs Abs: 1.7 10*3/uL (ref 0.7–4.0)
MCH: 25.2 pg — ABNORMAL LOW (ref 26.0–34.0)
MCHC: 30.5 g/dL (ref 30.0–36.0)
MCV: 82.4 fL (ref 80.0–100.0)
Monocytes Absolute: 0.7 10*3/uL (ref 0.1–1.0)
Monocytes Relative: 10 %
Neutro Abs: 4.8 10*3/uL (ref 1.7–7.7)
Neutrophils Relative %: 62 %
Platelets: 252 10*3/uL (ref 150–400)
RBC: 6.2 MIL/uL — ABNORMAL HIGH (ref 4.22–5.81)
RDW: 17.9 % — ABNORMAL HIGH (ref 11.5–15.5)
WBC: 7.6 10*3/uL (ref 4.0–10.5)
nRBC: 0 % (ref 0.0–0.2)

## 2021-06-29 LAB — IRON AND TIBC
Iron: 55 ug/dL (ref 45–182)
Saturation Ratios: 10 % — ABNORMAL LOW (ref 17.9–39.5)
TIBC: 536 ug/dL — ABNORMAL HIGH (ref 250–450)
UIBC: 481 ug/dL

## 2021-06-29 LAB — FERRITIN: Ferritin: 7 ng/mL — ABNORMAL LOW (ref 24–336)

## 2021-07-02 ENCOUNTER — Inpatient Hospital Stay (HOSPITAL_BASED_OUTPATIENT_CLINIC_OR_DEPARTMENT_OTHER): Payer: PPO | Admitting: Oncology

## 2021-07-02 ENCOUNTER — Inpatient Hospital Stay: Payer: PPO

## 2021-07-02 ENCOUNTER — Encounter: Payer: Self-pay | Admitting: Oncology

## 2021-07-02 VITALS — BP 142/65 | HR 84 | Temp 97.8°F | Resp 20 | Wt 241.0 lb

## 2021-07-02 DIAGNOSIS — D45 Polycythemia vera: Secondary | ICD-10-CM | POA: Diagnosis not present

## 2021-07-02 DIAGNOSIS — D751 Secondary polycythemia: Secondary | ICD-10-CM | POA: Diagnosis not present

## 2021-07-02 DIAGNOSIS — E669 Obesity, unspecified: Secondary | ICD-10-CM | POA: Diagnosis not present
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
## 2021-07-02 NOTE — Progress Notes (Signed)
Patient states he is concerned about his blood pressure being high.

## 2021-07-09 ENCOUNTER — Ambulatory Visit: Payer: PPO | Admitting: Oncology

## 2021-07-09 DIAGNOSIS — D229 Melanocytic nevi, unspecified: Secondary | ICD-10-CM | POA: Diagnosis not present

## 2021-07-09 DIAGNOSIS — I781 Nevus, non-neoplastic: Secondary | ICD-10-CM | POA: Diagnosis not present

## 2021-07-09 DIAGNOSIS — L821 Other seborrheic keratosis: Secondary | ICD-10-CM | POA: Diagnosis not present

## 2021-07-09 DIAGNOSIS — C44311 Basal cell carcinoma of skin of nose: Secondary | ICD-10-CM | POA: Diagnosis not present

## 2021-09-04 ENCOUNTER — Other Ambulatory Visit: Payer: Self-pay

## 2021-09-04 ENCOUNTER — Inpatient Hospital Stay: Payer: PPO | Attending: Oncology

## 2021-09-04 ENCOUNTER — Inpatient Hospital Stay: Payer: PPO

## 2021-09-04 DIAGNOSIS — D751 Secondary polycythemia: Secondary | ICD-10-CM

## 2021-09-04 DIAGNOSIS — D45 Polycythemia vera: Secondary | ICD-10-CM

## 2021-09-04 LAB — CBC WITH DIFFERENTIAL/PLATELET
Abs Immature Granulocytes: 0.03 10*3/uL (ref 0.00–0.07)
Basophils Absolute: 0.1 10*3/uL (ref 0.0–0.1)
Basophils Relative: 1 %
Eosinophils Absolute: 0.2 10*3/uL (ref 0.0–0.5)
Eosinophils Relative: 3 %
HCT: 53.9 % — ABNORMAL HIGH (ref 39.0–52.0)
Hemoglobin: 17.1 g/dL — ABNORMAL HIGH (ref 13.0–17.0)
Immature Granulocytes: 0 %
Lymphocytes Relative: 25 %
Lymphs Abs: 2.1 10*3/uL (ref 0.7–4.0)
MCH: 26.3 pg (ref 26.0–34.0)
MCHC: 31.7 g/dL (ref 30.0–36.0)
MCV: 82.8 fL (ref 80.0–100.0)
Monocytes Absolute: 0.7 10*3/uL (ref 0.1–1.0)
Monocytes Relative: 8 %
Neutro Abs: 5.4 10*3/uL (ref 1.7–7.7)
Neutrophils Relative %: 63 %
Platelets: 254 10*3/uL (ref 150–400)
RBC: 6.51 MIL/uL — ABNORMAL HIGH (ref 4.22–5.81)
RDW: 19.8 % — ABNORMAL HIGH (ref 11.5–15.5)
WBC: 8.4 10*3/uL (ref 4.0–10.5)
nRBC: 0 % (ref 0.0–0.2)

## 2021-09-04 NOTE — Patient Instructions (Signed)

## 2021-09-04 NOTE — Progress Notes (Signed)
500 ml blood removed per phlebotomy orders. Pt tolerated well. VSS. Discharged to home.

## 2021-10-18 DIAGNOSIS — D45 Polycythemia vera: Secondary | ICD-10-CM | POA: Insufficient documentation

## 2021-11-05 ENCOUNTER — Inpatient Hospital Stay: Payer: PPO

## 2021-11-05 ENCOUNTER — Inpatient Hospital Stay: Payer: PPO | Attending: Oncology

## 2021-11-05 ENCOUNTER — Other Ambulatory Visit: Payer: Self-pay

## 2021-11-05 DIAGNOSIS — D45 Polycythemia vera: Secondary | ICD-10-CM | POA: Insufficient documentation

## 2021-11-05 DIAGNOSIS — D751 Secondary polycythemia: Secondary | ICD-10-CM

## 2021-11-05 LAB — CBC WITH DIFFERENTIAL/PLATELET
Abs Immature Granulocytes: 0.04 10*3/uL (ref 0.00–0.07)
Basophils Absolute: 0.1 10*3/uL (ref 0.0–0.1)
Basophils Relative: 1 %
Eosinophils Absolute: 0.2 10*3/uL (ref 0.0–0.5)
Eosinophils Relative: 2 %
HCT: 54.4 % — ABNORMAL HIGH (ref 39.0–52.0)
Hemoglobin: 17.3 g/dL — ABNORMAL HIGH (ref 13.0–17.0)
Immature Granulocytes: 0 %
Lymphocytes Relative: 25 %
Lymphs Abs: 2.5 10*3/uL (ref 0.7–4.0)
MCH: 26.9 pg (ref 26.0–34.0)
MCHC: 31.8 g/dL (ref 30.0–36.0)
MCV: 84.5 fL (ref 80.0–100.0)
Monocytes Absolute: 1.1 10*3/uL — ABNORMAL HIGH (ref 0.1–1.0)
Monocytes Relative: 11 %
Neutro Abs: 6 10*3/uL (ref 1.7–7.7)
Neutrophils Relative %: 61 %
Platelets: 280 10*3/uL (ref 150–400)
RBC: 6.44 MIL/uL — ABNORMAL HIGH (ref 4.22–5.81)
RDW: 17.3 % — ABNORMAL HIGH (ref 11.5–15.5)
WBC: 9.9 10*3/uL (ref 4.0–10.5)
nRBC: 0 % (ref 0.0–0.2)

## 2021-11-05 NOTE — Progress Notes (Signed)
Removed 500 ml of blood per phlebotomy orders, pt tolerated procedure well. Accepted a beverage. VSS post procedure. Discharged to home.

## 2021-11-05 NOTE — Patient Instructions (Signed)

## 2021-11-19 DIAGNOSIS — L218 Other seborrheic dermatitis: Secondary | ICD-10-CM | POA: Diagnosis not present

## 2021-11-19 DIAGNOSIS — L821 Other seborrheic keratosis: Secondary | ICD-10-CM | POA: Diagnosis not present

## 2021-11-19 DIAGNOSIS — L570 Actinic keratosis: Secondary | ICD-10-CM | POA: Diagnosis not present

## 2021-11-19 DIAGNOSIS — D2261 Melanocytic nevi of right upper limb, including shoulder: Secondary | ICD-10-CM | POA: Diagnosis not present

## 2021-11-19 DIAGNOSIS — D2262 Melanocytic nevi of left upper limb, including shoulder: Secondary | ICD-10-CM | POA: Diagnosis not present

## 2021-11-19 DIAGNOSIS — Z85828 Personal history of other malignant neoplasm of skin: Secondary | ICD-10-CM | POA: Diagnosis not present

## 2021-11-19 DIAGNOSIS — D2271 Melanocytic nevi of right lower limb, including hip: Secondary | ICD-10-CM | POA: Diagnosis not present

## 2021-12-14 DIAGNOSIS — N1831 Chronic kidney disease, stage 3a: Secondary | ICD-10-CM | POA: Diagnosis not present

## 2021-12-14 DIAGNOSIS — I10 Essential (primary) hypertension: Secondary | ICD-10-CM | POA: Diagnosis not present

## 2021-12-14 DIAGNOSIS — E039 Hypothyroidism, unspecified: Secondary | ICD-10-CM | POA: Diagnosis not present

## 2021-12-14 DIAGNOSIS — E1122 Type 2 diabetes mellitus with diabetic chronic kidney disease: Secondary | ICD-10-CM | POA: Diagnosis not present

## 2021-12-14 DIAGNOSIS — E291 Testicular hypofunction: Secondary | ICD-10-CM | POA: Diagnosis not present

## 2021-12-21 DIAGNOSIS — Z1331 Encounter for screening for depression: Secondary | ICD-10-CM | POA: Diagnosis not present

## 2021-12-21 DIAGNOSIS — Z0000 Encounter for general adult medical examination without abnormal findings: Secondary | ICD-10-CM | POA: Diagnosis not present

## 2021-12-21 DIAGNOSIS — Z79899 Other long term (current) drug therapy: Secondary | ICD-10-CM | POA: Diagnosis not present

## 2021-12-21 DIAGNOSIS — Z125 Encounter for screening for malignant neoplasm of prostate: Secondary | ICD-10-CM | POA: Diagnosis not present

## 2021-12-24 ENCOUNTER — Other Ambulatory Visit: Payer: Self-pay | Admitting: *Deleted

## 2021-12-24 DIAGNOSIS — D751 Secondary polycythemia: Secondary | ICD-10-CM
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
## 2022-01-03 ENCOUNTER — Ambulatory Visit: Payer: PPO | Admitting: Cardiology

## 2022-01-03 ENCOUNTER — Encounter: Payer: Self-pay | Admitting: Oncology

## 2022-01-03 ENCOUNTER — Inpatient Hospital Stay: Payer: PPO | Attending: Oncology

## 2022-01-03 ENCOUNTER — Other Ambulatory Visit: Payer: Self-pay

## 2022-01-03 ENCOUNTER — Encounter: Payer: Self-pay | Admitting: Cardiology

## 2022-01-03 ENCOUNTER — Inpatient Hospital Stay: Payer: PPO

## 2022-01-03 ENCOUNTER — Ambulatory Visit (INDEPENDENT_AMBULATORY_CARE_PROVIDER_SITE_OTHER): Payer: PPO

## 2022-01-03 ENCOUNTER — Inpatient Hospital Stay (HOSPITAL_BASED_OUTPATIENT_CLINIC_OR_DEPARTMENT_OTHER): Payer: PPO | Admitting: Oncology

## 2022-01-03 VITALS — BP 130/54 | HR 80 | Temp 98.6°F | Wt 243.7 lb

## 2022-01-03 VITALS — BP 143/71 | HR 71

## 2022-01-03 VITALS — BP 138/66 | HR 79 | Ht 68.0 in | Wt 241.0 lb

## 2022-01-03 DIAGNOSIS — Z79899 Other long term (current) drug therapy: Secondary | ICD-10-CM | POA: Insufficient documentation

## 2022-01-03 DIAGNOSIS — E611 Iron deficiency: Secondary | ICD-10-CM | POA: Insufficient documentation

## 2022-01-03 DIAGNOSIS — E782 Mixed hyperlipidemia: Secondary | ICD-10-CM | POA: Diagnosis not present

## 2022-01-03 DIAGNOSIS — R002 Palpitations: Secondary | ICD-10-CM | POA: Diagnosis not present

## 2022-01-03 DIAGNOSIS — D751 Secondary polycythemia: Secondary | ICD-10-CM

## 2022-01-03 DIAGNOSIS — E039 Hypothyroidism, unspecified: Secondary | ICD-10-CM | POA: Diagnosis not present

## 2022-01-03 DIAGNOSIS — R5383 Other fatigue: Secondary | ICD-10-CM | POA: Diagnosis not present

## 2022-01-03 DIAGNOSIS — D45 Polycythemia vera: Secondary | ICD-10-CM

## 2022-01-03 DIAGNOSIS — I10 Essential (primary) hypertension: Secondary | ICD-10-CM

## 2022-01-03 DIAGNOSIS — R531 Weakness: Secondary | ICD-10-CM | POA: Diagnosis not present

## 2022-01-03 LAB — CBC WITH DIFFERENTIAL/PLATELET
Abs Immature Granulocytes: 0.04 10*3/uL (ref 0.00–0.07)
Basophils Absolute: 0.1 10*3/uL (ref 0.0–0.1)
Basophils Relative: 1 %
Eosinophils Absolute: 0.3 10*3/uL (ref 0.0–0.5)
Eosinophils Relative: 3 %
HCT: 53.8 % — ABNORMAL HIGH (ref 39.0–52.0)
Hemoglobin: 17.2 g/dL — ABNORMAL HIGH (ref 13.0–17.0)
Immature Granulocytes: 0 %
Lymphocytes Relative: 26 %
Lymphs Abs: 2.4 10*3/uL (ref 0.7–4.0)
MCH: 26.7 pg (ref 26.0–34.0)
MCHC: 32 g/dL (ref 30.0–36.0)
MCV: 83.5 fL (ref 80.0–100.0)
Monocytes Absolute: 0.8 10*3/uL (ref 0.1–1.0)
Monocytes Relative: 9 %
Neutro Abs: 5.4 10*3/uL (ref 1.7–7.7)
Neutrophils Relative %: 61 %
Platelets: 281 10*3/uL (ref 150–400)
RBC: 6.44 MIL/uL — ABNORMAL HIGH (ref 4.22–5.81)
RDW: 18.5 % — ABNORMAL HIGH (ref 11.5–15.5)
WBC: 8.9 10*3/uL (ref 4.0–10.5)
nRBC: 0 % (ref 0.0–0.2)

## 2022-01-03 LAB — IRON AND TIBC
Iron: 48 ug/dL (ref 45–182)
Saturation Ratios: 10 % — ABNORMAL LOW (ref 17.9–39.5)
TIBC: 494 ug/dL — ABNORMAL HIGH (ref 250–450)
UIBC: 446 ug/dL

## 2022-01-03 LAB — FERRITIN: Ferritin: 10 ng/mL — ABNORMAL LOW (ref 24–336)

## 2022-01-03 MED ORDER — LOSARTAN POTASSIUM 50 MG PO TABS: 50.0000 mg | ORAL_TABLET | Freq: Every day | ORAL | 3 refills | Status: DC

## 2022-01-03 NOTE — Patient Instructions (Signed)
Medication Instructions:   Your physician has recommended you make the following change in your medication:    STOP taking Lisinopril.  2.   START taking Losartan 50 MG once a day.  *If you need a refill on your cardiac medications before your next appointment, please call your pharmacy*   Lab Work: None ordered If you have labs (blood work) drawn today and your tests are completely normal, you will receive your results only by: [facility] (if you have MyChart) OR A paper copy in the mail If you have any lab test that is abnormal or we need to change your treatment, we will call you to review the results.   Testing/Procedures:  Your physician has recommended that you wear a Zio XT monitor for 2 weeks. This will be mailed to your home address in 4-5 business days.   This monitor is a medical device that records the hearts electrical activity. Doctors most often use these monitors to diagnose arrhythmias. Arrhythmias are problems with the speed or rhythm of the heartbeat. The monitor is a small device applied to your chest. You can wear one while you do your normal daily activities. While wearing this monitor if you have any symptoms to push the button and record what you felt. Once you have worn this monitor for the period of time provider prescribed (Usually 14 days), you will return the monitor device in the postage paid box. Once it is returned they will download the data collected and provide [place] with a report which the provider will then review and we will call you with those results. Important tips:  Avoid showering during the first 24 hours of wearing the monitor. Avoid excessive sweating to help maximize wear time. Do not submerge the device, no hot tubs, and no swimming pools. Keep any lotions or oils away from the patch. After 24 hours you may shower with the patch on. Take brief showers with your back facing the shower head.  Do not remove patch once it has been placed  because that will interrupt data and decrease adhesive wear time. Push the button when you have any symptoms and write down what you were feeling. Once you have completed wearing your monitor, remove and place into box which has postage paid and place in your outgoing mailbox.  If for some reason you have misplaced your box then call our office and we can provide another box and/or mail it off for you.      Follow-Up: At [facility], you and your health needs are our priority.  As part of our continuing mission to provide you with exceptional heart care, we have created designated Provider Care Teams.  These Care Teams include your primary Cardiologist (physician) and Advanced Practice Providers (APPs -  Physician Assistants and Nurse Practitioners) who all work together to provide you with the care you need, when you need it.  We recommend signing up for the patient portal called "MyChart".  Sign up information is provided on this After Visit Summary.  MyChart is used to connect with patients for Virtual Visits (Telemedicine).  Patients are able to view lab/test results, encounter notes, upcoming appointments, etc.  Non-urgent messages can be sent to your provider as well.   To learn more about what you can do with MyChart, go to [email or web address].    Your next appointment:   6 week(s)  The format for your next appointment:   In Person  Provider:   [name], MD ONLY  Other Instructions
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
## 2022-01-05 ENCOUNTER — Encounter: Payer: Self-pay | Admitting: Oncology

## 2022-01-07 DIAGNOSIS — R002 Palpitations: Secondary | ICD-10-CM | POA: Diagnosis not present

## 2022-01-28 ENCOUNTER — Telehealth: Payer: Self-pay | Admitting: Cardiology

## 2022-01-28 DIAGNOSIS — I472 Ventricular tachycardia, unspecified: Secondary | ICD-10-CM

## 2022-01-28 DIAGNOSIS — R002 Palpitations: Secondary | ICD-10-CM | POA: Diagnosis not present

## 2022-01-28 DIAGNOSIS — I471 Supraventricular tachycardia, unspecified: Secondary | ICD-10-CM

## 2022-01-28 MED ORDER — METOPROLOL SUCCINATE ER 50 MG PO TB24: 50.0000 mg | ORAL_TABLET | Freq: Every day | ORAL | 5 refills | Status: DC

## 2022-01-28 NOTE — Telephone Encounter (Signed)
Opened in error; duplicate.

## 2022-01-28 NOTE — Telephone Encounter (Signed)
Received Critical results from [place]. Monitor ordered by Dr. [name].  ? ?Zio 14 days 01/07/22 to 01/21/22 ? ?31 episodes of VT with one run of 218 beats sustained for 30 seconds. This was a symptomatic episode and can be found on page 13, Strip 14, at 12/20/21 at 4:43 pm. ? ?Patient also had 19 episodes of SVT.  ? ?Report is being uploaded and will print for provider to review.   ?

## 2022-01-28 NOTE — Telephone Encounter (Deleted)
Called patient and discussed the Zio result note as documented below. Orders entered for Echo and urgent referral. Patient will pick up new prescription today. ? ?Episodes of sustained VT noted. Start toprol xl '50mg'$  qd. Stop hctz. Get echo. Place urgent referral to EP.  ? ?Will route to scheduling for Echo and urgent referral to EP. ?

## 2022-01-28 NOTE — Telephone Encounter (Signed)
?  1. Is this related to a heart monitor you are wearing?  (If the patient says no, please ask     if they are caling about ICD/pacemaker.) NO ? ?2. What is your issue?? [facility] [employer] calling with Critical results unable to accept callback .   (If the patient is calling for results of the heart monitor this     message should be sent to nurse.) ? ? ?Please route to covering RN/CMA/RMA for results. Route to monitor technicians or your monitor tech representative for your site for any technical concerns ? ?

## 2022-01-28 NOTE — Telephone Encounter (Signed)
Patient is scheduled for Echo in office on Wed. 01/30/22 at 0830. Not able to get urgent referral on to Dr. Mardene Speak schedule until Wednesday 02/06/22. Will route to Dr. Quentin Ore and his nurse to review urgency in case he can be seen on 01/30/22. Patient does not have transportation to drive to [place] location,. ?

## 2022-01-28 NOTE — Telephone Encounter (Signed)
Called patient and discussed the Zio result note as documented below. Orders entered for Echo and urgent referral. Patient will pick up new prescription today. ?  ?Episodes of sustained VT noted. Start toprol xl '50mg'$  qd. Stop hctz. Get echo. Place urgent referral to EP.  ?  ?Will route to scheduling for Echo and urgent referral to EP. ?

## 2022-01-30 ENCOUNTER — Ambulatory Visit (INDEPENDENT_AMBULATORY_CARE_PROVIDER_SITE_OTHER): Payer: PPO | Admitting: Cardiology

## 2022-01-30 ENCOUNTER — Other Ambulatory Visit: Payer: Self-pay

## 2022-01-30 ENCOUNTER — Encounter: Payer: Self-pay | Admitting: Cardiology

## 2022-01-30 ENCOUNTER — Ambulatory Visit (INDEPENDENT_AMBULATORY_CARE_PROVIDER_SITE_OTHER): Payer: PPO

## 2022-01-30 VITALS — BP 144/70 | HR 53 | Ht 68.0 in | Wt 247.1 lb

## 2022-01-30 DIAGNOSIS — I472 Ventricular tachycardia, unspecified: Secondary | ICD-10-CM | POA: Diagnosis not present

## 2022-01-30 DIAGNOSIS — Z01812 Encounter for preprocedural laboratory examination: Secondary | ICD-10-CM | POA: Diagnosis not present

## 2022-01-30 DIAGNOSIS — I471 Supraventricular tachycardia: Secondary | ICD-10-CM

## 2022-01-30 DIAGNOSIS — E0590 Thyrotoxicosis, unspecified without thyrotoxic crisis or storm: Secondary | ICD-10-CM | POA: Diagnosis not present

## 2022-01-30 MED ORDER — PERFLUTREN LIPID MICROSPHERE
1.0000 mL | INTRAVENOUS | Status: AC | PRN
  Administered 2022-01-30: 2 mL via INTRAVENOUS

## 2022-01-30 MED ORDER — METOPROLOL SUCCINATE ER 50 MG PO TB24: 50.0000 mg | ORAL_TABLET | Freq: Two times a day (BID) | ORAL | 6 refills | Status: DC

## 2022-01-30 NOTE — Telephone Encounter (Signed)
Scheduled

## 2022-01-30 NOTE — Patient Instructions (Addendum)
Medication Instructions:  ?- Your physician has recommended you make the following change in your medication:  ? ?1) INCREASE Toprol (metoprolol succinate) to 50 mg: ?- take 1 tablet by mouth twice daily  ? ?*If you need a refill on your cardiac medications before your next appointment, please call your pharmacy* ? ? ?Lab Work: ?- Your physician recommends that you have lab work today: BMP/ CBC/ Magnesium ? ? ?If you have labs (blood work) drawn today and your tests are completely normal, you will receive your results only by: ?MyChart Message (if you have MyChart) OR ?A paper copy in the mail ?If you have any lab test that is abnormal or we need to change your treatment, we will call you to review the results. ? ? ?Testing/Procedures: ? ?1) Cardiac Catheterization: ?-Your physician has requested that you have a cardiac catheterization. Cardiac catheterization is used to diagnose and/or treat various heart conditions. Doctors may recommend this procedure for a number of different reasons. The most common reason is to evaluate chest pain. Chest pain can be a symptom of coronary artery disease (CAD), and cardiac catheterization can show whether plaque is narrowing or blocking your heart?s arteries. This procedure is also used to evaluate the valves, as well as measure the blood flow and oxygen levels in different parts of your heart.  ? ? ?You are scheduled for a Cardiac Catheterization on Friday, March 17 with Dr. Harrell Gave End. ? ?1. Please arrive at the [facility] at 8:30 AM (This time is one hour before your procedure to ensure your preparation). Free valet parking service is available.  ? ?Special note: Every effort is made to have your procedure done on time. Please understand that emergencies sometimes delay scheduled procedures. ? ?2. Diet: Do not eat solid foods after midnight.  You may have clear liquids until 5 AM upon the day of the procedure. ? ?3. Labs: 01/30/22 ? ?4. Medication  instructions in preparation for your procedure: ? ? Contrast Allergy: No ? ? ? ?On the morning of your procedure, take Aspirin 81 mg and any morning medicines NOT listed above.  You may use sips of water. ? ?5. Plan to go home the same day, you will only stay overnight if medically necessary. ?6. You MUST have a responsible adult to drive you home. ?7. An adult MUST be with you the first 24 hours after you arrive home. ?8. Bring a current list of your medications, and the last time and date medication taken. ?9. Bring ID and current insurance cards. ?10.Please wear clothes that are easy to get on and off and wear slip-on shoes. ? ? ? ?2) Cardiac MRI: You will be called by our MRI scheduler with a date/ time for this ?- Your physician has requested that you have a cardiac MRI. Cardiac MRI uses a computer to create images of your heart as its beating, producing both still and moving pictures of your heart and major blood vessels.  ? ?You are scheduled for Cardiac MRI on ____________.  ? ?Please arrive at the [facility] main entrance of [facility] at ____________ (30-45 minutes prior to test start time).  ??  ?[facility]  ?[address]  ?[place], [place] [address]  ?[phone]  ?Proceed to the [facility] Radiology Department (First Floor).  ??  ?Magnetic resonance imaging (MRI) is a painless test that produces images of the inside of the body without using X-rays. During an MRI, strong magnets  and radio waves work together in a Research officer, political party to form detailed images. MRI images may provide more details about a medical condition than X-rays, CT scans, and ultrasounds can provide.  ? ?- You may be given earphones to listen for instructions.  ?- You may eat a light breakfast and take medications as ordered ?- If a contrast material will be used, an IV will be inserted into one of your veins. Contrast material will be injected into your IV.  ?- You will be asked to remove all metal, including:  Watch, jewelry, and other metal objects including hearing aids, hair pieces and dentures. (Braces and fillings normally are not a problem.)  ?- If contrast material was used:  ?It will leave your body through your urine within a day. You may be told to drink plenty of fluids to help flush the contrast material out of your system. ?  ?TEST WILL TAKE APPROXIMATELY 1 HOUR  ?PLEASE NOTIFY SCHEDULING AT LEAST 24 HOURS IN ADVANCE IF YOU ARE UNABLE TO KEEP YOUR APPOINTMENT.   ? ?3) You have been referred to :  Endocrinology in [place] for management of your thyroid ?- Their office will call you directly to schedule an appointment ? ? ? ?Follow-Up: ?At [facility], you and your health needs are our priority.  As part of our continuing mission to provide you with exceptional heart care, we have created designated Provider Care Teams.  These Care Teams include your primary Cardiologist (physician) and Advanced Practice Providers (APPs -  Physician Assistants and Nurse Practitioners) who all work together to provide you with the care you need, when you need it. ? ?We recommend signing up for the patient portal called "MyChart".  Sign up information is provided on this After Visit Summary.  MyChart is used to connect with patients for Virtual Visits (Telemedicine).  Patients are able to view lab/test results, encounter notes, upcoming appointments, etc.  Non-urgent messages can be sent to your provider as well.   ?To learn more about what you can do with MyChart, go to [email or web address].   ? ?Your next appointment:   ?6-8 week(s) ? ?The format for your next appointment:   ?In Person ? ?Provider:   ?Lars Mage, MD  ? ? ?Other Instructions ? ?Coronary Angiogram ?A coronary angiogram is an X-ray procedure that is used to examine the arteries in the heart. Contrast dye is injected through a long, thin tube (catheter) into these arteries. Then X-rays are taken to show any blockage in these arteries. ?You may have  this procedure if you: ?Are having chest pain, or other symptoms of angina, and you are at risk for heart disease. ?Have an abnormal stress test or test of your heart's electrical activity (electrocardiogram, or ECG). ?Have chest pain and heart failure. ?Are having irregular heart rhythms. ?A coronary angiogram or heart catheterization can show if you have valve disease or a disease of the aorta. This procedure can also be used to check the overall function of your heart muscle. ?Let your health care provider know about: ?Any allergies you have, including allergies to medicines or contrast dye. ?All medicines you are taking, including vitamins, herbs, eye drops, creams, and over-the-counter medicines. ?Any problems you or family members have had with anesthetic medicines. ?Any blood disorders you have. ?Any surgeries you have had. ?Any history of kidney problems or kidney failure. ?Any medical conditions you have. ?Whether you are pregnant or may be pregnant. ?Whether you are breastfeeding. ?What are the risks? ?Generally,  this is a safe procedure. However, problems may occur, including: ?Infection. ?Allergic reaction to medicines or dyes that are used. ?Bleeding from the insertion site or other places. ?Damage to nearby structures, such as blood vessels, or damage to kidneys from contrast dye. ?Irregular heart rhythms. ?Stroke (rare). ?Heart attack (rare). ?What happens before the procedure? ?Staying hydrated ?Follow instructions from your health care provider about hydration, which may include: ?Up to 2 hours before the procedure - you may continue to drink clear liquids, such as water, clear fruit juice, black coffee, and plain tea. ? ?Eating and drinking restrictions ?Follow instructions from your health care provider about eating and drinking, which may include: ?8 hours before the procedure - stop eating heavy meals or foods, such as meat, fried foods, or fatty foods. ?6 hours before the procedure - stop  eating light meals or foods, such as toast or cereal. ?6 hours before the procedure - stop drinking milk or drinks that contain milk. ?2 hours before the procedure - stop drinking clear liquids. ?Medicines ?Ask y
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
## 2022-01-31 ENCOUNTER — Other Ambulatory Visit: Payer: Self-pay | Admitting: Nurse Practitioner

## 2022-01-31 LAB — ECHOCARDIOGRAM COMPLETE
AR max vel: 2.19 cm2
AV Area VTI: 2.71 cm2
AV Area mean vel: 2.59 cm2
AV Mean grad: 4 mmHg
AV Peak grad: 7 mmHg
Ao pk vel: 1.32 m/s
Area-P 1/2: 4.39 cm2
S' Lateral: 3.1 cm

## 2022-01-31 LAB — BASIC METABOLIC PANEL WITH GFR
BUN/Creatinine Ratio: 15 (ref 10–24)
BUN: 19 mg/dL (ref 8–27)
CO2: 24 mmol/L (ref 20–29)
Calcium: 9.5 mg/dL (ref 8.6–10.2)
Chloride: 102 mmol/L (ref 96–106)
Creatinine, Ser: 1.26 mg/dL (ref 0.76–1.27)
Glucose: 88 mg/dL (ref 70–99)
Potassium: 6 mmol/L — ABNORMAL HIGH (ref 3.5–5.2)
Sodium: 140 mmol/L (ref 134–144)
eGFR: 59 mL/min/1.73 — ABNORMAL LOW (ref >59)

## 2022-01-31 LAB — CBC
Hematocrit: 54.9 % — ABNORMAL HIGH (ref 37.5–51.0)
Hemoglobin: 17.5 g/dL (ref 13.0–17.7)
MCH: 26.4 pg — ABNORMAL LOW (ref 26.6–33.0)
MCHC: 31.9 g/dL (ref 31.5–35.7)
MCV: 83 fL (ref 79–97)
Platelets: 257 x10E3/uL (ref 150–450)
RBC: 6.62 x10E6/uL — ABNORMAL HIGH (ref 4.14–5.80)
RDW: 18.4 % — ABNORMAL HIGH (ref 11.6–15.4)
WBC: 9.3 x10E3/uL (ref 3.4–10.8)

## 2022-01-31 LAB — MAGNESIUM: Magnesium: 2.2 mg/dL (ref 1.6–2.3)

## 2022-02-01 ENCOUNTER — Inpatient Hospital Stay
Admission: RE | Admit: 2022-02-01 | Discharge: 2022-02-01 | DRG: 287 | Disposition: A | Discharge status: Other Acute Inpatient Hospital | Payer: PPO | Attending: Internal Medicine | Admitting: Internal Medicine

## 2022-02-01 ENCOUNTER — Encounter
Admission: RE | Disposition: A | Discharge status: Other Acute Inpatient Hospital | Payer: Self-pay | Source: Home / Self Care | Attending: Internal Medicine

## 2022-02-01 ENCOUNTER — Inpatient Hospital Stay (HOSPITAL_COMMUNITY)
Admission: AD | Admit: 2022-02-01 | Discharge: 2022-02-09 | DRG: 236 | Disposition: A | Discharge status: Home / Self Care | Payer: PPO | Source: Other Acute Inpatient Hospital | Attending: Thoracic Surgery (Cardiothoracic Vascular Surgery) | Admitting: Thoracic Surgery (Cardiothoracic Vascular Surgery)

## 2022-02-01 ENCOUNTER — Encounter (HOSPITAL_COMMUNITY): Payer: Self-pay | Admitting: Cardiovascular Disease

## 2022-02-01 ENCOUNTER — Other Ambulatory Visit: Payer: Self-pay

## 2022-02-01 ENCOUNTER — Encounter: Payer: Self-pay | Admitting: Internal Medicine

## 2022-02-01 DIAGNOSIS — J9811 Atelectasis: Secondary | ICD-10-CM | POA: Diagnosis not present

## 2022-02-01 DIAGNOSIS — E039 Hypothyroidism, unspecified: Secondary | ICD-10-CM | POA: Diagnosis not present

## 2022-02-01 DIAGNOSIS — Z20822 Contact with and (suspected) exposure to covid-19: Secondary | ICD-10-CM | POA: Diagnosis present

## 2022-02-01 DIAGNOSIS — Z800 Family history of malignant neoplasm of digestive organs: Secondary | ICD-10-CM

## 2022-02-01 DIAGNOSIS — N289 Disorder of kidney and ureter, unspecified: Secondary | ICD-10-CM | POA: Diagnosis present

## 2022-02-01 DIAGNOSIS — Z8679 Personal history of other diseases of the circulatory system: Secondary | ICD-10-CM | POA: Diagnosis not present

## 2022-02-01 DIAGNOSIS — E291 Testicular hypofunction: Secondary | ICD-10-CM | POA: Diagnosis not present

## 2022-02-01 DIAGNOSIS — R7303 Prediabetes: Secondary | ICD-10-CM | POA: Diagnosis present

## 2022-02-01 DIAGNOSIS — E8770 Fluid overload, unspecified: Secondary | ICD-10-CM | POA: Diagnosis not present

## 2022-02-01 DIAGNOSIS — Z8249 Family history of ischemic heart disease and other diseases of the circulatory system: Secondary | ICD-10-CM

## 2022-02-01 DIAGNOSIS — Z951 Presence of aortocoronary bypass graft: Principal | ICD-10-CM

## 2022-02-01 DIAGNOSIS — E669 Obesity, unspecified: Secondary | ICD-10-CM | POA: Diagnosis present

## 2022-02-01 DIAGNOSIS — E0500 Thyrotoxicosis with diffuse goiter without thyrotoxic crisis or storm: Secondary | ICD-10-CM | POA: Diagnosis present

## 2022-02-01 DIAGNOSIS — Z9889 Other specified postprocedural states: Secondary | ICD-10-CM | POA: Diagnosis not present

## 2022-02-01 DIAGNOSIS — Z01810 Encounter for preprocedural cardiovascular examination: Secondary | ICD-10-CM | POA: Diagnosis not present

## 2022-02-01 DIAGNOSIS — J9601 Acute respiratory failure with hypoxia: Secondary | ICD-10-CM | POA: Diagnosis not present

## 2022-02-01 DIAGNOSIS — I4892 Unspecified atrial flutter: Secondary | ICD-10-CM | POA: Diagnosis not present

## 2022-02-01 DIAGNOSIS — J811 Chronic pulmonary edema: Secondary | ICD-10-CM | POA: Diagnosis present

## 2022-02-01 DIAGNOSIS — J9600 Acute respiratory failure, unspecified whether with hypoxia or hypercapnia: Secondary | ICD-10-CM | POA: Diagnosis not present

## 2022-02-01 DIAGNOSIS — I472 Ventricular tachycardia, unspecified: Secondary | ICD-10-CM

## 2022-02-01 DIAGNOSIS — I2510 Atherosclerotic heart disease of native coronary artery without angina pectoris: Secondary | ICD-10-CM

## 2022-02-01 DIAGNOSIS — Z833 Family history of diabetes mellitus: Secondary | ICD-10-CM

## 2022-02-01 DIAGNOSIS — J90 Pleural effusion, not elsewhere classified: Secondary | ICD-10-CM | POA: Diagnosis not present

## 2022-02-01 DIAGNOSIS — I480 Paroxysmal atrial fibrillation: Secondary | ICD-10-CM | POA: Diagnosis not present

## 2022-02-01 DIAGNOSIS — Z79890 Hormone replacement therapy (postmenopausal): Secondary | ICD-10-CM

## 2022-02-01 DIAGNOSIS — Z01818 Encounter for other preprocedural examination: Secondary | ICD-10-CM | POA: Diagnosis not present

## 2022-02-01 DIAGNOSIS — Z79899 Other long term (current) drug therapy: Secondary | ICD-10-CM | POA: Diagnosis not present

## 2022-02-01 DIAGNOSIS — I10 Essential (primary) hypertension: Secondary | ICD-10-CM | POA: Diagnosis present

## 2022-02-01 DIAGNOSIS — I517 Cardiomegaly: Secondary | ICD-10-CM | POA: Diagnosis not present

## 2022-02-01 DIAGNOSIS — Z8701 Personal history of pneumonia (recurrent): Secondary | ICD-10-CM | POA: Diagnosis not present

## 2022-02-01 DIAGNOSIS — E119 Type 2 diabetes mellitus without complications: Secondary | ICD-10-CM | POA: Diagnosis present

## 2022-02-01 DIAGNOSIS — Z6836 Body mass index (BMI) 36.0-36.9, adult: Secondary | ICD-10-CM

## 2022-02-01 DIAGNOSIS — I088 Other rheumatic multiple valve diseases: Secondary | ICD-10-CM | POA: Diagnosis not present

## 2022-02-01 DIAGNOSIS — E785 Hyperlipidemia, unspecified: Secondary | ICD-10-CM | POA: Diagnosis present

## 2022-02-01 DIAGNOSIS — R000 Tachycardia, unspecified: Secondary | ICD-10-CM | POA: Diagnosis not present

## 2022-02-01 DIAGNOSIS — I25118 Atherosclerotic heart disease of native coronary artery with other forms of angina pectoris: Secondary | ICD-10-CM | POA: Diagnosis not present

## 2022-02-01 DIAGNOSIS — Z7982 Long term (current) use of aspirin: Secondary | ICD-10-CM

## 2022-02-01 DIAGNOSIS — D62 Acute posthemorrhagic anemia: Secondary | ICD-10-CM | POA: Diagnosis not present

## 2022-02-01 DIAGNOSIS — Z83438 Family history of other disorder of lipoprotein metabolism and other lipidemia: Secondary | ICD-10-CM

## 2022-02-01 DIAGNOSIS — I471 Supraventricular tachycardia: Secondary | ICD-10-CM | POA: Diagnosis not present

## 2022-02-01 DIAGNOSIS — Z880 Allergy status to penicillin: Secondary | ICD-10-CM

## 2022-02-01 DIAGNOSIS — R0602 Shortness of breath: Secondary | ICD-10-CM | POA: Diagnosis not present

## 2022-02-01 DIAGNOSIS — Z9911 Dependence on respirator [ventilator] status: Secondary | ICD-10-CM | POA: Diagnosis not present

## 2022-02-01 DIAGNOSIS — J939 Pneumothorax, unspecified: Secondary | ICD-10-CM | POA: Diagnosis not present

## 2022-02-01 HISTORY — DX: Malignant (primary) neoplasm, unspecified: C80.1

## 2022-02-01 HISTORY — DX: Ventricular tachycardia, unspecified: I47.20

## 2022-02-01 HISTORY — PX: LEFT HEART CATH AND CORONARY ANGIOGRAPHY: CATH118249

## 2022-02-01 LAB — BASIC METABOLIC PANEL
Anion gap: 8 (ref 5–15)
BUN: 19 mg/dL (ref 8–23)
CO2: 23 mmol/L (ref 22–32)
Calcium: 8.7 mg/dL — ABNORMAL LOW (ref 8.9–10.3)
Chloride: 103 mmol/L (ref 98–111)
Creatinine, Ser: 1.37 mg/dL — ABNORMAL HIGH (ref 0.61–1.24)
GFR, Estimated: 54 mL/min — ABNORMAL LOW (ref >60)
Glucose, Bld: 104 mg/dL — ABNORMAL HIGH (ref 70–99)
Potassium: 4.4 mmol/L (ref 3.5–5.1)
Sodium: 134 mmol/L — ABNORMAL LOW (ref 135–145)

## 2022-02-01 LAB — CBC
HCT: 51.8 % (ref 39.0–52.0)
Hemoglobin: 16.2 g/dL (ref 13.0–17.0)
MCH: 26.4 pg (ref 26.0–34.0)
MCHC: 31.3 g/dL (ref 30.0–36.0)
MCV: 84.4 fL (ref 80.0–100.0)
Platelets: 256 10*3/uL (ref 150–400)
RBC: 6.14 MIL/uL — ABNORMAL HIGH (ref 4.22–5.81)
RDW: 18.8 % — ABNORMAL HIGH (ref 11.5–15.5)
WBC: 10.7 10*3/uL — ABNORMAL HIGH (ref 4.0–10.5)
nRBC: 0 % (ref 0.0–0.2)

## 2022-02-01 LAB — CREATININE, SERUM
Creatinine, Ser: 1.34 mg/dL — ABNORMAL HIGH (ref 0.61–1.24)
GFR, Estimated: 55 mL/min — ABNORMAL LOW (ref >60)

## 2022-02-01 LAB — POTASSIUM (ARMC VASCULAR LAB ONLY): Potassium (ARMC vascular lab): 4.5 (ref 3.5–5.1)

## 2022-02-01 LAB — PREPARE RBC (CROSSMATCH)

## 2022-02-01 LAB — ABO/RH: ABO/RH(D): O POS

## 2022-02-01 SURGERY — LEFT HEART CATH AND CORONARY ANGIOGRAPHY
Anesthesia: Moderate Sedation

## 2022-02-01 MED ORDER — PLASMA-LYTE A IV SOLN
INTRAVENOUS | Status: DC
  Filled 2022-02-01: qty 2.5

## 2022-02-01 MED ORDER — CHLORHEXIDINE GLUCONATE CLOTH 2 % EX PADS: 6.0000 | MEDICATED_PAD | Freq: Once | CUTANEOUS | Status: DC

## 2022-02-01 MED ORDER — ONDANSETRON HCL 4 MG/2ML IJ SOLN: 4.0000 mg | Freq: Four times a day (QID) | INTRAMUSCULAR | Status: DC | PRN

## 2022-02-01 MED ORDER — METOPROLOL SUCCINATE ER 50 MG PO TB24
50.0000 mg | ORAL_TABLET | Freq: Two times a day (BID) | ORAL | Status: DC
  Administered 2022-02-01 – 2022-02-04 (×6): 50 mg via ORAL
  Filled 2022-02-01 (×6): qty 1

## 2022-02-01 MED ORDER — POTASSIUM CHLORIDE 2 MEQ/ML IV SOLN
80.0000 meq | INTRAVENOUS | Status: DC
  Filled 2022-02-01: qty 40

## 2022-02-01 MED ORDER — MANNITOL 20 % IV SOLN
INTRAVENOUS | Status: DC
  Filled 2022-02-01: qty 13

## 2022-02-01 MED ORDER — NITROGLYCERIN 0.4 MG SL SUBL: 0.4000 mg | SUBLINGUAL_TABLET | SUBLINGUAL | Status: DC | PRN

## 2022-02-01 MED ORDER — MIDAZOLAM HCL 2 MG/2ML IJ SOLN
INTRAMUSCULAR | Status: AC
  Filled 2022-02-01: qty 2

## 2022-02-01 MED ORDER — METOPROLOL SUCCINATE ER 50 MG PO TB24
50.0000 mg | ORAL_TABLET | Freq: Two times a day (BID) | ORAL | Status: DC
Start: 2022-02-01 — End: 2022-02-01

## 2022-02-01 MED ORDER — ENOXAPARIN SODIUM 40 MG/0.4ML IJ SOSY: 40.0000 mg | PREFILLED_SYRINGE | INTRAMUSCULAR | Status: DC

## 2022-02-01 MED ORDER — NITROGLYCERIN IN D5W 200-5 MCG/ML-% IV SOLN
2.0000 ug/min | INTRAVENOUS | Status: AC
  Administered 2022-02-04: 5 ug/min via INTRAVENOUS
  Filled 2022-02-01: qty 250

## 2022-02-01 MED ORDER — TEMAZEPAM 15 MG PO CAPS: 15.0000 mg | ORAL_CAPSULE | Freq: Once | ORAL | Status: DC | PRN

## 2022-02-01 MED ORDER — ASPIRIN EC 81 MG PO TBEC
81.0000 mg | DELAYED_RELEASE_TABLET | Freq: Every day | ORAL | Status: DC
  Administered 2022-02-02 – 2022-02-03 (×2): 81 mg via ORAL
  Filled 2022-02-01 (×2): qty 1

## 2022-02-01 MED ORDER — PHENYLEPHRINE HCL-NACL 20-0.9 MG/250ML-% IV SOLN
30.0000 ug/min | INTRAVENOUS | Status: AC
  Administered 2022-02-04: 50 ug/min via INTRAVENOUS
  Filled 2022-02-01: qty 250

## 2022-02-01 MED ORDER — PROSIGHT PO TABS
1.0000 | ORAL_TABLET | Freq: Every day | ORAL | Status: DC
  Administered 2022-02-02 – 2022-02-09 (×7): 1 via ORAL
  Filled 2022-02-01 (×8): qty 1

## 2022-02-01 MED ORDER — SODIUM CHLORIDE 0.9 % WEIGHT BASED INFUSION
3.0000 mL/kg/h | INTRAVENOUS | Status: DC
  Administered 2022-02-01: 3 mL/kg/h via INTRAVENOUS

## 2022-02-01 MED ORDER — ACETAMINOPHEN 325 MG PO TABS: 650.0000 mg | ORAL_TABLET | ORAL | Status: DC | PRN

## 2022-02-01 MED ORDER — SODIUM CHLORIDE 0.9 % IV SOLN: 250.0000 mL | INTRAVENOUS | Status: DC | PRN

## 2022-02-01 MED ORDER — MILRINONE LACTATE IN DEXTROSE 20-5 MG/100ML-% IV SOLN
0.3000 ug/kg/min | INTRAVENOUS | Status: DC
  Filled 2022-02-01: qty 100

## 2022-02-01 MED ORDER — HEPARIN SODIUM (PORCINE) 1000 UNIT/ML IJ SOLN
INTRAMUSCULAR | Status: AC
  Filled 2022-02-01: qty 10

## 2022-02-01 MED ORDER — FENTANYL CITRATE (PF) 100 MCG/2ML IJ SOLN
INTRAMUSCULAR | Status: AC
  Filled 2022-02-01: qty 2

## 2022-02-01 MED ORDER — LEVOTHYROXINE SODIUM 75 MCG PO TABS
175.0000 ug | ORAL_TABLET | Freq: Every day | ORAL | Status: DC
  Administered 2022-02-02 – 2022-02-09 (×8): 175 ug via ORAL
  Filled 2022-02-01 (×8): qty 1

## 2022-02-01 MED ORDER — EPINEPHRINE HCL 5 MG/250ML IV SOLN IN NS
0.0000 ug/min | INTRAVENOUS | Status: DC
  Filled 2022-02-01: qty 250

## 2022-02-01 MED ORDER — VANCOMYCIN HCL 1500 MG/300ML IV SOLN
1500.0000 mg | INTRAVENOUS | Status: AC
  Administered 2022-02-04: 1500 mg via INTRAVENOUS
  Filled 2022-02-01: qty 300

## 2022-02-01 MED ORDER — CHLORHEXIDINE GLUCONATE 0.12 % MT SOLN
15.0000 mL | Freq: Once | OROMUCOSAL | Status: AC
  Administered 2022-02-04: 15 mL via OROMUCOSAL
  Filled 2022-02-01: qty 15

## 2022-02-01 MED ORDER — HEPARIN 30,000 UNITS/1000 ML (OHS) CELLSAVER SOLUTION
Status: DC
Start: 2022-02-04 — End: 2022-02-04
  Filled 2022-02-01: qty 1000

## 2022-02-01 MED ORDER — TRANEXAMIC ACID 1000 MG/10ML IV SOLN
1.5000 mg/kg/h | INTRAVENOUS | Status: AC
  Administered 2022-02-04: 1.5 mg/kg/h via INTRAVENOUS
  Filled 2022-02-01: qty 25

## 2022-02-01 MED ORDER — LIDOCAINE HCL (PF) 1 % IJ SOLN
INTRAMUSCULAR | Status: DC | PRN
  Administered 2022-02-01: 2 mL

## 2022-02-01 MED ORDER — VITAMIN D 25 MCG (1000 UNIT) PO TABS
1000.0000 [IU] | ORAL_TABLET | Freq: Every day | ORAL | Status: DC
  Administered 2022-02-02 – 2022-02-09 (×7): 1000 [IU] via ORAL
  Filled 2022-02-01 (×8): qty 1

## 2022-02-01 MED ORDER — METOPROLOL TARTRATE 12.5 MG HALF TABLET
12.5000 mg | ORAL_TABLET | Freq: Once | ORAL | Status: AC
  Administered 2022-02-02: 12.5 mg via ORAL
  Filled 2022-02-01: qty 1

## 2022-02-01 MED ORDER — SODIUM CHLORIDE 0.9% FLUSH: 3.0000 mL | Freq: Two times a day (BID) | INTRAVENOUS | Status: DC

## 2022-02-01 MED ORDER — BISACODYL 5 MG PO TBEC
5.0000 mg | DELAYED_RELEASE_TABLET | Freq: Once | ORAL | Status: AC
  Administered 2022-02-01: 5 mg via ORAL
  Filled 2022-02-01: qty 1

## 2022-02-01 MED ORDER — CEFAZOLIN SODIUM-DEXTROSE 2-4 GM/100ML-% IV SOLN
2.0000 g | INTRAVENOUS | Status: AC
Start: 2022-02-04 — End: 2022-02-04
  Administered 2022-02-04: 2 g via INTRAVENOUS
  Filled 2022-02-01: qty 100

## 2022-02-01 MED ORDER — SODIUM CHLORIDE 0.9% FLUSH: 3.0000 mL | INTRAVENOUS | Status: DC | PRN

## 2022-02-01 MED ORDER — LIDOCAINE HCL 1 % IJ SOLN
INTRAMUSCULAR | Status: AC
  Filled 2022-02-01: qty 20

## 2022-02-01 MED ORDER — LEVOTHYROXINE SODIUM 175 MCG PO TABS: 175.0000 ug | ORAL_TABLET | Freq: Every morning | ORAL | Status: DC

## 2022-02-01 MED ORDER — DEXMEDETOMIDINE HCL IN NACL 400 MCG/100ML IV SOLN
0.1000 ug/kg/h | INTRAVENOUS | Status: AC
  Administered 2022-02-04: .5 ug/kg/h via INTRAVENOUS
  Filled 2022-02-01: qty 100

## 2022-02-01 MED ORDER — TRANEXAMIC ACID (OHS) BOLUS VIA INFUSION
15.0000 mg/kg | INTRAVENOUS | Status: AC
Start: 2022-02-04 — End: 2022-02-04
  Administered 2022-02-04: 1647 mg via INTRAVENOUS
  Filled 2022-02-01: qty 1647

## 2022-02-01 MED ORDER — NOREPINEPHRINE 4 MG/250ML-% IV SOLN
0.0000 ug/min | INTRAVENOUS | Status: DC
  Filled 2022-02-01: qty 250

## 2022-02-01 MED ORDER — ATORVASTATIN CALCIUM 80 MG PO TABS
80.0000 mg | ORAL_TABLET | Freq: Every day | ORAL | Status: DC
  Administered 2022-02-02 – 2022-02-09 (×7): 80 mg via ORAL
  Filled 2022-02-01 (×8): qty 1

## 2022-02-01 MED ORDER — IOHEXOL 300 MG/ML  SOLN
INTRAMUSCULAR | Status: DC | PRN
  Administered 2022-02-01: 92 mL

## 2022-02-01 MED ORDER — MIDAZOLAM HCL 2 MG/2ML IJ SOLN
INTRAMUSCULAR | Status: DC | PRN
  Administered 2022-02-01: 1 mg via INTRAVENOUS

## 2022-02-01 MED ORDER — HEPARIN SODIUM (PORCINE) 1000 UNIT/ML IJ SOLN
INTRAMUSCULAR | Status: DC | PRN
  Administered 2022-02-01: 5000 [IU] via INTRAVENOUS

## 2022-02-01 MED ORDER — HEPARIN (PORCINE) IN NACL 1000-0.9 UT/500ML-% IV SOLN
INTRAVENOUS | Status: AC
  Filled 2022-02-01: qty 1000

## 2022-02-01 MED ORDER — HEPARIN SODIUM (PORCINE) 5000 UNIT/ML IJ SOLN
5000.0000 [IU] | Freq: Three times a day (TID) | INTRAMUSCULAR | Status: DC
  Administered 2022-02-01 – 2022-02-04 (×8): 5000 [IU] via SUBCUTANEOUS
  Filled 2022-02-01 (×9): qty 1

## 2022-02-01 MED ORDER — ASPIRIN 81 MG PO CHEW: 81.0000 mg | CHEWABLE_TABLET | ORAL | Status: DC

## 2022-02-01 MED ORDER — INSULIN REGULAR(HUMAN) IN NACL 100-0.9 UT/100ML-% IV SOLN
INTRAVENOUS | Status: AC
Start: 2022-02-04 — End: 2022-02-04
  Administered 2022-02-04: .9 [IU]/h via INTRAVENOUS
  Filled 2022-02-01: qty 100

## 2022-02-01 MED ORDER — VERAPAMIL HCL 2.5 MG/ML IV SOLN
INTRAVENOUS | Status: AC
  Filled 2022-02-01: qty 2

## 2022-02-01 MED ORDER — ATORVASTATIN CALCIUM 80 MG PO TABS: 80.0000 mg | ORAL_TABLET | Freq: Every day | ORAL | Status: DC

## 2022-02-01 MED ORDER — ASPIRIN 81 MG PO CHEW: 81.0000 mg | CHEWABLE_TABLET | Freq: Every day | ORAL | Status: DC

## 2022-02-01 MED ORDER — HYDRALAZINE HCL 20 MG/ML IJ SOLN: 10.0000 mg | INTRAMUSCULAR | Status: DC | PRN

## 2022-02-01 MED ORDER — TRANEXAMIC ACID (OHS) PUMP PRIME SOLUTION
2.0000 mg/kg | INTRAVENOUS | Status: DC
  Filled 2022-02-01: qty 2.2

## 2022-02-01 MED ORDER — VERAPAMIL HCL 2.5 MG/ML IV SOLN
INTRAVENOUS | Status: DC | PRN
  Administered 2022-02-01: 2.5 mg via INTRA_ARTERIAL

## 2022-02-01 MED ORDER — ALUM & MAG HYDROXIDE-SIMETH 200-200-20 MG/5ML PO SUSP: 15.0000 mL | ORAL | Status: DC | PRN

## 2022-02-01 MED ORDER — LABETALOL HCL 5 MG/ML IV SOLN: 10.0000 mg | INTRAVENOUS | Status: DC | PRN

## 2022-02-01 MED ORDER — HEPARIN (PORCINE) IN NACL 1000-0.9 UT/500ML-% IV SOLN
INTRAVENOUS | Status: DC | PRN
  Administered 2022-02-01 (×2): 500 mL

## 2022-02-01 MED ORDER — CHLORHEXIDINE GLUCONATE CLOTH 2 % EX PADS
6.0000 | MEDICATED_PAD | Freq: Once | CUTANEOUS | Status: AC
  Administered 2022-02-03: 6 via TOPICAL

## 2022-02-01 MED ORDER — SODIUM CHLORIDE 0.9 % IV SOLN: INTRAVENOUS | Status: DC

## 2022-02-01 MED ORDER — SODIUM CHLORIDE 0.9 % WEIGHT BASED INFUSION: 1.0000 mL/kg/h | INTRAVENOUS | Status: DC

## 2022-02-01 MED ORDER — CEFAZOLIN SODIUM-DEXTROSE 2-4 GM/100ML-% IV SOLN
2.0000 g | INTRAVENOUS | Status: AC
  Administered 2022-02-04: 2 g via INTRAVENOUS
  Filled 2022-02-01: qty 100

## 2022-02-01 MED ORDER — FENTANYL CITRATE (PF) 100 MCG/2ML IJ SOLN
INTRAMUSCULAR | Status: DC | PRN
  Administered 2022-02-01: 50 ug via INTRAVENOUS

## 2022-02-01 MED ORDER — LOSARTAN POTASSIUM 50 MG PO TABS
50.0000 mg | ORAL_TABLET | Freq: Every day | ORAL | Status: DC
Start: 2022-02-01 — End: 2022-02-04
  Administered 2022-02-02 – 2022-02-03 (×2): 50 mg via ORAL
  Filled 2022-02-01 (×3): qty 1

## 2022-02-01 SURGICAL SUPPLY — 15 items
CATH INFINITI 5 FR 3DRC (CATHETERS) ×1 IMPLANT
CATH INFINITI 5 FR JL3.5 (CATHETERS) ×1 IMPLANT
CATH INFINITI 5FR AL1 (CATHETERS) ×1 IMPLANT
CATH INFINITI JR4 5F (CATHETERS) ×1 IMPLANT
DEVICE RAD COMP TR BAND LRG (VASCULAR PRODUCTS) ×1 IMPLANT
DRAPE BRACHIAL (DRAPES) ×1 IMPLANT
GLIDESHEATH SLEND SS 6F .021 (SHEATH) ×1 IMPLANT
GUIDEWIRE INQWIRE 1.5J.035X260 (WIRE) IMPLANT
INQWIRE 1.5J .035X260CM (WIRE) ×2
KIT SYRINGE INJ CVI SPIKEX1 (MISCELLANEOUS) ×1 IMPLANT
PACK CARDIAC CATH (CUSTOM PROCEDURE TRAY) ×2 IMPLANT
PAD ELECT DEFIB RADIOL ZOLL (MISCELLANEOUS) ×1 IMPLANT
PROTECTION STATION PRESSURIZED (MISCELLANEOUS) ×2
SET ATX SIMPLICITY (MISCELLANEOUS) ×1 IMPLANT
STATION PROTECTION PRESSURIZED (MISCELLANEOUS) IMPLANT

## 2022-02-01 NOTE — Interval H&P Note (Signed)
History and Physical Interval Note: ? ?02/01/2022 ?9:22 AM ? ?Dustin Lawrence  has presented today for surgery, with the diagnosis of ventricular tachycardia.  The various methods of treatment have been discussed with the patient and family. After consideration of risks, benefits and other options for treatment, the patient has consented to  Procedure(s): ?LEFT HEART CATH AND CORONARY ANGIOGRAPHY (N/A) as a surgical intervention.  The patient's history has been reviewed, patient examined, no change in status, stable for surgery.  I have reviewed the patient's chart and labs.  Questions were answered to the patient's satisfaction.   ? ?Cath Lab Visit (complete for each Cath Lab visit) ? ?Clinical Evaluation Leading to the Procedure:  ? ?ACS: No. ? ?Non-ACS:   ? ?Anginal Classification: No Symptoms ? ?Anti-ischemic medical therapy: Minimal Therapy (1 class of medications) ? ?Non-Invasive Test Results: No non-invasive testing performed ? ?Prior CABG: No previous CABG ? ?Dustin Lawrence ? ? ?
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
## 2022-02-01 NOTE — Discharge Summary (Signed)
?Discharge Summary  ?  ?Patient ID: [name] ?MRN: [id number]; DOB: 1946/04/10 ? ?Admit date: 02/01/2022 ?Discharge date: 02/01/2022 ? ?PCP:  [name], MD ?  ?[facility] Providers ?Cardiologist:  [name], MD  ?Electrophysiologist:  [name], MD  { ? ? ? ?Discharge Diagnoses  ?  ?Principal Problem: ?  Ventricular tachycardia ? ? ? ?Diagnostic Studies/Procedures  ?  ?Cardiac catheterization (02/01/2022): ?90% proximal LAD, mid/distal LCx, and distal LCx stenoses.  Mildly elevated left ventricular filling pressure. ?_____________ ?  ?History of Present Illness   ?  ?Keron RENELL ALLUM is a 76 y.o. male with man with history of recently diagnosed sustained ventricular tachycardia with associated palpitations but no syncope, hypertension, hyperlipidemia, prediabetes, and thyroid disease, presenting for evaluation of ventricular tachycardia. ? ?Hospital Course  ?   ?Consultants: None ? ?Patient underwent diagnostic left heart catheterization with coronary angiography demonstrating severe disease involving proximal LAD and dominant LCx.  He remained asymptomatic during and after the procedure.  Occasional isolated PVCs were observed but no ventricular tachycardia.  He will be transferred to [facility] for consultation with cardiac surgery for CABG.  I think his anatomy would also be amenable to multivessel PCI if he elects to defer surgery.  Addition of a P2 Y12 inhibitor will be deferred pending surgical consultation.  Patient was maintained on aspirin and metoprolol.  Atorvastatin was added for secondary prevention. ? ?Did the patient have an acute coronary syndrome (MI, NSTEMI, STEMI, etc) this admission?:  No                               ?Did the patient have a percutaneous coronary intervention (stent / angioplasty)?:  No.   ? ?   ? ?  ?_____________ ? ?Discharge Vitals ?Blood pressure 140/65, pulse 62, temperature 98.1 ?F (36.7 ?C), temperature source Oral, resp. rate 16, height  '5\' 8"'$  (1.727 m), weight 109.8 kg, SpO2 96 %.  Danley Danker Weights  ? 02/01/22 0847  ?Weight: 109.8 kg  ? ? ?Labs & Radiologic Studies  ?  ?CBC ?Recent Labs  ?  01/30/22 ?1130  ?WBC 9.3  ?HGB 17.5  ?HCT 54.9*  ?MCV 83  ?PLT 257  ? ?Basic Metabolic Panel ?Recent Labs  ?  01/30/22 ?1130 02/01/22 ?0933  ?NA 140 134*  ?K 6.0* 4.4  ?CL 102 103  ?CO2 24 23  ?GLUCOSE 88 104*  ?BUN 19 19  ?CREATININE 1.26 1.37*  ?CALCIUM 9.5 8.7*  ?MG 2.2  --   ? ?Liver Function Tests ?No results for input(s): AST, ALT, ALKPHOS, BILITOT, PROT, ALBUMIN in the last 72 hours. ?No results for input(s): LIPASE, AMYLASE in the last 72 hours. ?High Sensitivity Troponin:   ?No results for input(s): TROPONINIHS in the last 720 hours.  ?BNP ?Invalid input(s): POCBNP ?D-Dimer ?No results for input(s): DDIMER in the last 72 hours. ?Hemoglobin A1C ?No results for input(s): HGBA1C in the last 72 hours. ?Fasting Lipid Panel ?No results for input(s): CHOL, HDL, LDLCALC, TRIG, CHOLHDL, LDLDIRECT in the last 72 hours. ?Thyroid Function Tests ?No results for input(s): TSH, T4TOTAL, T3FREE, THYROIDAB in the last 72 hours. ? ?Invalid input(s): FREET3 ?_____________  ?CARDIAC CATHETERIZATION ? ?Result Date: 02/01/2022 ?Conclusions: Severe two-vessel coronary artery disease involving the LAD and dominant LCx.  This includes 90% proximal LAD stenosis and sequential 50% mid, 90% mid/distal, and 90% distal LCx lesions. Mildly elevated left ventricular filling pressure (LVEDP 20 mmHg). Tortuous right  subclavian/innominate artery limiting catheter manipulation.  Recommend alternative access for future catheterizations. Recommendations: Transfer to [facility] for cardiac surgery consultation.  Given multiple episodes of sustained ventricular tachycardia on recent event monitor, I recommend CABG or multivessel PCI before the patient is discharged home. Aggressive secondary prevention; will add atorvastatin 80 mg daily. Continue metoprolol for medical treatment of ventricular  ectopy. Nelva Bush, MD [facility] ? ?ECHOCARDIOGRAM COMPLETE ? ?Result Date: 01/31/2022 ?   ECHOCARDIOGRAM REPORT   Patient Name:   TAMI BARREN Date of Exam: 01/30/2022 Medical Rec #:  [id number]          Height:       68.0 in Accession #:    [id number]         Weight:       243.7 lb Date of Birth:  06-12-1946         BSA:          2.223 m? Patient Age:    79 years           BP:           130/54 mmHg Patient Gender: M                  HR:           66 bpm. Exam Location:  [facility] Procedure: 2D Echo, Cardiac Doppler, Color Doppler and Intracardiac            Opacification Agent Indications:    I47.1 SVT  History:        Patient has no prior history of Echocardiogram examinations.                 Arrythmias:SVT; Risk Factors:Hypertension, Dyslipidemia and                 Non-Smoker. H/o Vtach.  Sonographer:    Pilar Jarvis RDMS, RVT, RDCS Referring Phys: [id number] BRIAN AGBOR-ETANG IMPRESSIONS  1. Left ventricular ejection fraction, by estimation, is 60 to 65%. The left ventricle has normal function. The left ventricle has no regional wall motion abnormalities. Left ventricular diastolic parameters are consistent with Grade I diastolic dysfunction (impaired relaxation).  2. Right ventricular systolic function is normal. The right ventricular size is normal. Tricuspid regurgitation signal is inadequate for assessing PA pressure.  3. Left atrial size was mildly dilated.  4. The mitral valve is normal in structure. Mild mitral valve regurgitation. No evidence of mitral stenosis.  5. The aortic valve was not well visualized. Aortic valve regurgitation is not visualized. No aortic stenosis is present.  6. The inferior vena cava is normal in size with greater than 50% respiratory variability, suggesting right atrial pressure of 3 mmHg. FINDINGS  Left Ventricle: Left ventricular ejection fraction, by estimation, is 60 to 65%. The left ventricle has normal function. The left ventricle has no regional wall motion  abnormalities. Definity contrast agent was given IV to delineate the left ventricular  endocardial borders. The left ventricular internal cavity size was normal in size. There is no left ventricular hypertrophy. Left ventricular diastolic parameters are consistent with Grade I diastolic dysfunction (impaired relaxation). Right Ventricle: The right ventricular size is normal. No increase in right ventricular wall thickness. Right ventricular systolic function is normal. Tricuspid regurgitation signal is inadequate for assessing PA pressure. Left Atrium: Left atrial size was mildly dilated. Right Atrium: Right atrial size was normal in size. Pericardium: There is no evidence of pericardial effusion. Mitral Valve: The mitral valve is normal in  structure. Mild mitral valve regurgitation. No evidence of mitral valve stenosis. Tricuspid Valve: The tricuspid valve is normal in structure. Tricuspid valve regurgitation is not demonstrated. No evidence of tricuspid stenosis. Aortic Valve: The aortic valve was not well visualized. Aortic valve regurgitation is not visualized. No aortic stenosis is present. Aortic valve mean gradient measures 4.0 mmHg. Aortic valve peak gradient measures 7.0 mmHg. Aortic valve area, by VTI measures 2.71 cm?. Pulmonic Valve: The pulmonic valve was normal in structure. Pulmonic valve regurgitation is not visualized. No evidence of pulmonic stenosis. Aorta: The aortic root is normal in size and structure. Venous: The inferior vena cava is normal in size with greater than 50% respiratory variability, suggesting right atrial pressure of 3 mmHg. IAS/Shunts: No atrial level shunt detected by color flow Doppler.  LEFT VENTRICLE PLAX 2D LVIDd:         4.30 cm   Diastology LVIDs:         3.10 cm   LV e' medial:    7.18 cm/s LV PW:         1.10 cm   LV E/e' medial:  11.3 LV IVS:        1.30 cm   LV e' lateral:   9.03 cm/s LVOT diam:     2.10 cm   LV E/e' lateral: 9.0 LV SV:         66 LV SV Index:   30  LVOT Area:     3.46 cm?  RIGHT VENTRICLE RV Basal diam:  3.70 cm RV S prime:     15.90 cm/s TAPSE (M-mode): 3.0 cm LEFT ATRIUM             Index LA diam:        3.70 cm 1.66 cm/m? LA Vol (A2C):   70.7 m

## 2022-02-01 NOTE — Brief Op Note (Signed)
BRIEF CARDIAC CATHETERIZATION NOTE ? ?02/01/2022 ? ?10:39 AM ? ?PATIENT:  Dustin Lawrence  76 y.o. male ? ?PRE-OPERATIVE DIAGNOSIS:  Ventricular tachycardia ? ?POST-OPERATIVE DIAGNOSIS:  Same ? ?PROCEDURE:  Procedure(s): ?LEFT HEART CATH AND CORONARY ANGIOGRAPHY (N/A) ? ?SURGEON:  Surgeon(s) and Role: ?   Nelva Bush, MD - Primary ? ?FINDINGS: ?Severe 2-vessel CAD with 90% stenosis of proximal LAD, as well as 80-90% stenosis of mid and distal dominant LCx. ?Mildly elevated LVEDP. ? ?RECOMMENDATION: ?Consider transfer to [facility] for surgical consultation for CABG versus multivessel PCI. ?Continue metoprolol. ?Aggressive secondary prevention of CAD. ? ?Nelva Bush, MD ?[facility] ? ?
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
## 2022-02-02 ENCOUNTER — Other Ambulatory Visit (HOSPITAL_COMMUNITY): Payer: PPO

## 2022-02-02 DIAGNOSIS — I472 Ventricular tachycardia, unspecified: Secondary | ICD-10-CM | POA: Diagnosis not present

## 2022-02-02 DIAGNOSIS — I25118 Atherosclerotic heart disease of native coronary artery with other forms of angina pectoris: Secondary | ICD-10-CM | POA: Diagnosis not present

## 2022-02-02 LAB — BASIC METABOLIC PANEL
Anion gap: 10 (ref 5–15)
BUN: 15 mg/dL (ref 8–23)
CO2: 22 mmol/L (ref 22–32)
Calcium: 8.6 mg/dL — ABNORMAL LOW (ref 8.9–10.3)
Chloride: 105 mmol/L (ref 98–111)
Creatinine, Ser: 1.24 mg/dL (ref 0.61–1.24)
GFR, Estimated: >60 mL/min (ref >60)
Glucose, Bld: 92 mg/dL (ref 70–99)
Potassium: 4.2 mmol/L (ref 3.5–5.1)
Sodium: 137 mmol/L (ref 135–145)

## 2022-02-02 LAB — CBC
HCT: 50.3 % (ref 39.0–52.0)
Hemoglobin: 16.1 g/dL (ref 13.0–17.0)
MCH: 26.7 pg (ref 26.0–34.0)
MCHC: 32 g/dL (ref 30.0–36.0)
MCV: 83.6 fL (ref 80.0–100.0)
Platelets: 222 10*3/uL (ref 150–400)
RBC: 6.02 MIL/uL — ABNORMAL HIGH (ref 4.22–5.81)
RDW: 18.6 % — ABNORMAL HIGH (ref 11.5–15.5)
WBC: 10 10*3/uL (ref 4.0–10.5)
nRBC: 0 % (ref 0.0–0.2)

## 2022-02-02 LAB — HEMOGLOBIN A1C
Hgb A1c MFr Bld: 6 % — ABNORMAL HIGH (ref 4.8–5.6)
Mean Plasma Glucose: 125.5 mg/dL

## 2022-02-02 NOTE — Progress Notes (Signed)
? ?Progress Note ? ?Patient Name: Dustin Lawrence ?Date of Encounter: 02/02/2022 ? ?[facility] Cardiologist: [name], MD  ? ?Subjective  ? ?Denies dyspnea or angina walking in the room.  Had some intestinal problems overnight that have resolved. ? ?Inpatient Medications  ?  ?Scheduled Meds: ? aspirin EC  81 mg Oral Daily  ? atorvastatin  80 mg Oral Daily  ? chlorhexidine  15 mL Mouth/Throat Once  ? Chlorhexidine Gluconate Cloth  6 each Topical Once  ? And  ? Chlorhexidine Gluconate Cloth  6 each Topical Once  ? cholecalciferol  1,000 Units Oral Daily  ? [START ON 02/04/2022] epinephrine  0-10 mcg/min Intravenous To OR  ? heparin  5,000 Units Subcutaneous Q8H  ? [START ON 02/04/2022] heparin-papaverine-plasmalyte irrigation   Irrigation To OR  ? [START ON 02/04/2022] insulin   Intravenous To OR  ? [START ON 02/04/2022] [place] Blood Cardioplegia vial (lidocaine/magnesium/mannitol 0.26g-4g-6.4g)   Intracoronary To OR  ? levothyroxine  175 mcg Oral Q0600  ? losartan  50 mg Oral Daily  ? metoprolol succinate  50 mg Oral BID  ? multivitamin  1 tablet Oral Daily  ? [START ON 02/04/2022] phenylephrine  30-200 mcg/min Intravenous To OR  ? [START ON 02/04/2022] potassium chloride  80 mEq Other To OR  ? [START ON 02/04/2022] tranexamic acid  15 mg/kg Intravenous To OR  ? [START ON 02/04/2022] tranexamic acid  2 mg/kg Intracatheter To OR  ? ?Continuous Infusions: ? [START ON 02/04/2022]  ceFAZolin (ANCEF) IV    ? [START ON 02/04/2022]  ceFAZolin (ANCEF) IV    ? [START ON 02/04/2022] dexmedetomidine    ? [START ON 02/04/2022] heparin 30,000 units/NS 1000 mL solution for CELLSAVER    ? [START ON 02/04/2022] milrinone    ? [START ON 02/04/2022] nitroGLYCERIN    ? [START ON 02/04/2022] norepinephrine    ? [START ON 02/04/2022] tranexamic acid (CYKLOKAPRON) infusion ([facility])    ? [START ON 02/04/2022] vancomycin    ? ?PRN Meds: ?acetaminophen, alum & mag hydroxide-simeth, nitroGLYCERIN, ondansetron (ZOFRAN) IV, temazepam  ? ?Vital  Signs  ?  ?Vitals:  ? 02/02/22 0013 02/02/22 0411 02/02/22 0801 02/02/22 0913  ?BP: (!) 141/64 132/61 138/65 (!) 148/79  ?Pulse: (!) 59 (!) 57 68 67  ?Resp: '18 15 17   '$ ?Temp: 98.3 ?F (36.8 ?C) 98.1 ?F (36.7 ?C) 97.9 ?F (36.6 ?C)   ?TempSrc: Oral Oral Oral   ?SpO2: 93% 95% 95%   ? ? ?Intake/Output Summary (Last 24 hours) at 02/02/2022 1107 ?Last data filed at 02/02/2022 1000 ?Gross per 24 hour  ?Intake 1080 ml  ?Output 1200 ml  ?Net -120 ml  ? ?Last 3 Weights 02/01/2022 01/30/2022 01/03/2022  ?Weight (lbs) 242 lb 247 lb 2 oz 243 lb 11.2 oz  ?Weight (kg) 109.77 kg 112.095 kg 110.542 kg  ?   ? ?Telemetry  ?  ?NSR, occ PVCs, no VT - Personally Reviewed ? ?ECG  ?  ?NSR - Personally Reviewed ? ?Physical Exam  ?Comfortable ?GEN: No acute distress.   ?Neck: No JVD ?Cardiac: RRR, no murmurs, rubs, or gallops.  ?Respiratory: Clear to auscultation bilaterally. ?GI: Soft, nontender, non-distended  ?MS: No edema; No deformity. ?Neuro:  Nonfocal  ?Psych: Normal affect  ? ?Labs  ?  ?High Sensitivity Troponin:  No results for input(s): TROPONINIHS in the last 720 hours.   ?Chemistry ?Recent Labs  ?Lab 01/30/22 ?1130 02/01/22 ?0933 02/01/22 ?1604 02/02/22 ?0331  ?NA 140 134*  --  137  ?K  6.0* 4.4  --  4.2  ?CL 102 103  --  105  ?CO2 24 23  --  22  ?GLUCOSE 88 104*  --  92  ?BUN 19 19  --  15  ?CREATININE 1.26 1.37* 1.34* 1.24  ?CALCIUM 9.5 8.7*  --  8.6*  ?MG 2.2  --   --   --   ?GFRNONAA  --  54* 55* >60  ?ANIONGAP  --  8  --  10  ?  ?Lipids No results for input(s): CHOL, TRIG, HDL, LABVLDL, LDLCALC, CHOLHDL in the last 168 hours.  ?Hematology ?Recent Labs  ?Lab 01/30/22 ?1130 02/01/22 ?1604 02/02/22 ?0331  ?WBC 9.3 10.7* 10.0  ?RBC 6.62* 6.14* 6.02*  ?HGB 17.5 16.2 16.1  ?HCT 54.9* 51.8 50.3  ?MCV 83 84.4 83.6  ?MCH 26.4* 26.4 26.7  ?MCHC 31.9 31.3 32.0  ?RDW 18.4* 18.8* 18.6*  ?PLT 257 256 222  ? ?Thyroid No results for input(s): TSH, FREET4 in the last 168 hours.  ?BNPNo results for input(s): BNP, PROBNP in the last 168 hours.   ?DDimer No results for input(s): DDIMER in the last 168 hours.  ? ?Radiology  ?  ?CARDIAC CATHETERIZATION ? ?Result Date: 02/01/2022 ?Conclusions: Severe two-vessel coronary artery disease involving the LAD and dominant LCx.  This includes 90% proximal LAD stenosis and sequential 50% mid, 90% mid/distal, and 90% distal LCx lesions. Mildly elevated left ventricular filling pressure (LVEDP 20 mmHg). Tortuous right subclavian/innominate artery limiting catheter manipulation.  Recommend alternative access for future catheterizations. Recommendations: Transfer to [facility] for cardiac surgery consultation.  Given multiple episodes of sustained ventricular tachycardia on recent event monitor, I recommend CABG or multivessel PCI before the patient is discharged home. Aggressive secondary prevention; will add atorvastatin 80 mg daily. Continue metoprolol for medical treatment of ventricular ectopy. Nelva Bush, MD [facility]  ? ?Cardiac Studies  ? ?CATH 02/01/2022 ? ?Conclusions: ?Severe two-vessel coronary artery disease involving the LAD and dominant LCx.  This includes 90% proximal LAD stenosis and sequential 50% mid, 90% mid/distal, and 90% distal LCx lesions. ?Mildly elevated left ventricular filling pressure (LVEDP 20 mmHg). ?Tortuous right subclavian/innominate artery limiting catheter manipulation.  Recommend alternative access for future catheterizations. ?  ?Recommendations: ?Transfer to [facility] for cardiac surgery consultation.  Given multiple episodes of sustained ventricular tachycardia on recent event monitor, I recommend CABG or multivessel PCI before the patient is discharged home. ?Aggressive secondary prevention; will add atorvastatin 80 mg daily. ?Continue metoprolol for medical treatment of ventricular ectopy. ?  ?Echocardiogram 01/30/2022 ? ? 1. Left ventricular ejection fraction, by estimation, is 60 to 65%. The  ?left ventricle has normal function. The left ventricle has no regional  ?wall  motion abnormalities. Left ventricular diastolic parameters are  ?consistent with Grade I diastolic  ?dysfunction (impaired relaxation).  ? 2. Right ventricular systolic function is normal. The right ventricular  ?size is normal. Tricuspid regurgitation signal is inadequate for assessing  ?PA pressure.  ? 3. Left atrial size was mildly dilated.  ? 4. The mitral valve is normal in structure. Mild mitral valve  ?regurgitation. No evidence of mitral stenosis.  ? 5. The aortic valve was not well visualized. Aortic valve regurgitation  ?is not visualized. No aortic stenosis is present.  ? 6. The inferior vena cava is normal in size with greater than 50%  ?respiratory variability, suggesting right atrial pressure of 3 mmHg.  ? ?Patient Profile  ?   ?76 y.o. male with hypertension, hypercholesterolemia, borderline diabetes mellitus  severe three-vessel coronary artery disease, discovered during evaluation for sustained symptomatic ventricular arrhythmia.  Awaiting evaluation for anticipated bypass surgery Monday afternoon. ? ?Assessment & Plan  ?  ?Currently asymptomatic, well hemodynamically compensated.  No complex ventricular arrhythmias since admission.  Tentatively scheduled for bypass surgery on Monday. ? ?For questions or updates, please contact [place] ?Please consult [email or web address] for contact info under  ? ?  ?   ?Signed, ?Sanda Klein, MD  ?02/02/2022, 11:07 AM   ? ?

## 2022-02-02 NOTE — Progress Notes (Signed)
CARDIAC REHAB PHASE I  ? ?Pre-op cardiac surgery education completed.  Able to demonstrate staying in the tube, getting up and down from a sitting position and out of the bed.  Has experience with IS from a recent hospitalization.  Given cardiac surgery booklet.  No further questions, verbalized understanding. ?[id number] ?Liliane Channel RN, BSN ?02/02/2022 ?8:43 AM ? ? ?

## 2022-02-03 ENCOUNTER — Inpatient Hospital Stay (HOSPITAL_COMMUNITY): Payer: PPO

## 2022-02-03 DIAGNOSIS — I472 Ventricular tachycardia, unspecified: Secondary | ICD-10-CM | POA: Diagnosis not present

## 2022-02-03 DIAGNOSIS — Z01810 Encounter for preprocedural cardiovascular examination: Secondary | ICD-10-CM

## 2022-02-03 NOTE — Progress Notes (Signed)
? ?Progress Note ? ?Patient Name: Dustin Lawrence ?Date of Encounter: 02/03/2022 ? ?[facility] Cardiologist: [name], MD  ? ?Subjective  ? ?Getting preop Dopplers. No angina. Brief NSVT 0630 h today. ? ?Inpatient Medications  ?  ?Scheduled Meds: ? aspirin EC  81 mg Oral Daily  ? atorvastatin  80 mg Oral Daily  ? chlorhexidine  15 mL Mouth/Throat Once  ? Chlorhexidine Gluconate Cloth  6 each Topical Once  ? And  ? Chlorhexidine Gluconate Cloth  6 each Topical Once  ? cholecalciferol  1,000 Units Oral Daily  ? [START ON 02/04/2022] epinephrine  0-10 mcg/min Intravenous To OR  ? heparin  5,000 Units Subcutaneous Q8H  ? [START ON 02/04/2022] heparin-papaverine-plasmalyte irrigation   Irrigation To OR  ? [START ON 02/04/2022] insulin   Intravenous To OR  ? [START ON 02/04/2022] [facility] Blood Cardioplegia vial (lidocaine/magnesium/mannitol 0.26g-4g-6.4g)   Intracoronary To OR  ? levothyroxine  175 mcg Oral Q0600  ? losartan  50 mg Oral Daily  ? metoprolol succinate  50 mg Oral BID  ? multivitamin  1 tablet Oral Daily  ? [START ON 02/04/2022] phenylephrine  30-200 mcg/min Intravenous To OR  ? [START ON 02/04/2022] potassium chloride  80 mEq Other To OR  ? [START ON 02/04/2022] tranexamic acid  15 mg/kg Intravenous To OR  ? [START ON 02/04/2022] tranexamic acid  2 mg/kg Intracatheter To OR  ? ?Continuous Infusions: ? [START ON 02/04/2022]  ceFAZolin (ANCEF) IV    ? [START ON 02/04/2022]  ceFAZolin (ANCEF) IV    ? [START ON 02/04/2022] dexmedetomidine    ? [START ON 02/04/2022] heparin 30,000 units/NS 1000 mL solution for CELLSAVER    ? [START ON 02/04/2022] milrinone    ? [START ON 02/04/2022] nitroGLYCERIN    ? [START ON 02/04/2022] norepinephrine    ? [START ON 02/04/2022] tranexamic acid (CYKLOKAPRON) infusion ([facility])    ? [START ON 02/04/2022] vancomycin    ? ?PRN Meds: ?acetaminophen, alum & mag hydroxide-simeth, nitroGLYCERIN, ondansetron (ZOFRAN) IV, temazepam  ? ?Vital Signs  ?  ?Vitals:  ? 02/02/22 2039 02/03/22  0032 02/03/22 0452 02/03/22 0759  ?BP: (!) 148/63 (!) 147/65 (!) 155/75 (!) 156/78  ?Pulse: 63 62 79 73  ?Resp: '18 16 16 18  '$ ?Temp: (!) 97.4 ?F (36.3 ?C) (!) 96.9 ?F (36.1 ?C) (!) 97.5 ?F (36.4 ?C) 97.9 ?F (36.6 ?C)  ?TempSrc: Oral Axillary Oral Oral  ?SpO2:    98%  ? ? ?Intake/Output Summary (Last 24 hours) at 02/03/2022 0921 ?Last data filed at 02/03/2022 0451 ?Gross per 24 hour  ?Intake 240 ml  ?Output 1275 ml  ?Net -1035 ml  ? ?Last 3 Weights 02/01/2022 01/30/2022 01/03/2022  ?Weight (lbs) 242 lb 247 lb 2 oz 243 lb 11.2 oz  ?Weight (kg) 109.77 kg 112.095 kg 110.542 kg  ?   ? ?Telemetry  ?  ?Normal sinus rhythm with back-to-back episodes of nonsustained VT, 6 & 7 beats respectively Personally Reviewed ? ?ECG  ?  ?No new tracing - Personally Reviewed ? ?Physical Exam  ?Obese ?GEN: No acute distress.   ?Neck: No JVD ?Cardiac: RRR, no murmurs, rubs, or gallops.  ?Respiratory: Clear to auscultation bilaterally. ?GI: Soft, nontender, non-distended  ?MS: No edema; No deformity. ?Neuro:  Nonfocal  ?Psych: Normal affect  ? ?Labs  ?  ?High Sensitivity Troponin:  No results for input(s): TROPONINIHS in the last 720 hours.   ?Chemistry ?Recent Labs  ?Lab 01/30/22 ?1130 02/01/22 ?0933 02/01/22 ?1604 02/02/22 ?0331  ?  NA 140 134*  --  137  ?K 6.0* 4.4  --  4.2  ?CL 102 103  --  105  ?CO2 24 23  --  22  ?GLUCOSE 88 104*  --  92  ?BUN 19 19  --  15  ?CREATININE 1.26 1.37* 1.34* 1.24  ?CALCIUM 9.5 8.7*  --  8.6*  ?MG 2.2  --   --   --   ?GFRNONAA  --  54* 55* >60  ?ANIONGAP  --  8  --  10  ?  ?Lipids No results for input(s): CHOL, TRIG, HDL, LABVLDL, LDLCALC, CHOLHDL in the last 168 hours.  ?Hematology ?Recent Labs  ?Lab 01/30/22 ?1130 02/01/22 ?1604 02/02/22 ?0331  ?WBC 9.3 10.7* 10.0  ?RBC 6.62* 6.14* 6.02*  ?HGB 17.5 16.2 16.1  ?HCT 54.9* 51.8 50.3  ?MCV 83 84.4 83.6  ?MCH 26.4* 26.4 26.7  ?MCHC 31.9 31.3 32.0  ?RDW 18.4* 18.8* 18.6*  ?PLT 257 256 222  ? ?Thyroid No results for input(s): TSH, FREET4 in the last 168 hours.  ?BNPNo  results for input(s): BNP, PROBNP in the last 168 hours.  ?DDimer No results for input(s): DDIMER in the last 168 hours.  ? ?Radiology  ?  ?CARDIAC CATHETERIZATION ? ?Result Date: 02/01/2022 ?Conclusions: Severe two-vessel coronary artery disease involving the LAD and dominant LCx.  This includes 90% proximal LAD stenosis and sequential 50% mid, 90% mid/distal, and 90% distal LCx lesions. Mildly elevated left ventricular filling pressure (LVEDP 20 mmHg). Tortuous right subclavian/innominate artery limiting catheter manipulation.  Recommend alternative access for future catheterizations. Recommendations: Transfer to [facility] for cardiac surgery consultation.  Given multiple episodes of sustained ventricular tachycardia on recent event monitor, I recommend CABG or multivessel PCI before the patient is discharged home. Aggressive secondary prevention; will add atorvastatin 80 mg daily. Continue metoprolol for medical treatment of ventricular ectopy. Nelva Bush, MD [facility]  ? ?Cardiac Studies  ? ?CATH 02/01/2022 ?  ?Conclusions: ?Severe two-vessel coronary artery disease involving the LAD and dominant LCx.  This includes 90% proximal LAD stenosis and sequential 50% mid, 90% mid/distal, and 90% distal LCx lesions. ?Mildly elevated left ventricular filling pressure (LVEDP 20 mmHg). ?Tortuous right subclavian/innominate artery limiting catheter manipulation.  Recommend alternative access for future catheterizations. ?  ?Recommendations: ?Transfer to [facility] for cardiac surgery consultation.  Given multiple episodes of sustained ventricular tachycardia on recent event monitor, I recommend CABG or multivessel PCI before the patient is discharged home. ?Aggressive secondary prevention; will add atorvastatin 80 mg daily. ?Continue metoprolol for medical treatment of ventricular ectopy. ?  ?Echocardiogram 01/30/2022 ?  ? 1. Left ventricular ejection fraction, by estimation, is 60 to 65%. The  ?left ventricle has  normal function. The left ventricle has no regional  ?wall motion abnormalities. Left ventricular diastolic parameters are  ?consistent with Grade I diastolic  ?dysfunction (impaired relaxation).  ? 2. Right ventricular systolic function is normal. The right ventricular  ?size is normal. Tricuspid regurgitation signal is inadequate for assessing  ?PA pressure.  ? 3. Left atrial size was mildly dilated.  ? 4. The mitral valve is normal in structure. Mild mitral valve  ?regurgitation. No evidence of mitral stenosis.  ? 5. The aortic valve was not well visualized. Aortic valve regurgitation  ?is not visualized. No aortic stenosis is present.  ? 6. The inferior vena cava is normal in size with greater than 50%  ?respiratory variability, suggesting right atrial pressure of 3 mmHg.  ? ?Patient Profile  ?   ?  76 y.o. male with hypertension, hypercholesterolemia, borderline diabetes mellitus severe three-vessel coronary artery disease, discovered during evaluation for sustained symptomatic ventricular arrhythmia.  Awaiting evaluation for anticipated bypass surgery Monday afternoon. ? ?Assessment & Plan  ?  ?Asymptomatic NSVT. On beta blocker. ?Severe multivessel CAD. Preserved LVEF. ?Plan for CABG tomorrow afternoon. ? ?For questions or updates, please contact [place] ?Please consult [email or web address] for contact info under  ? ?  ?   ?Signed, ?Sanda Klein, MD  ?02/03/2022, 9:21 AM   ? ?

## 2022-02-03 NOTE — Anesthesia Preprocedure Evaluation (Addendum)
Anesthesia Evaluation  ?Patient identified by MRN, date of birth, ID band ?Patient awake ? ? ? ?Reviewed: ?Allergy & Precautions, NPO status , Patient's Chart, lab work & pertinent test results ? ?Airway ?Mallampati: II ? ?TM Distance: >3 FB ?Neck ROM: Full ? ? ? Dental ?no notable dental hx. ? ?  ?Pulmonary ?neg pulmonary ROS,  ?  ?Pulmonary exam normal ? ? ? ? ? ? ? Cardiovascular ?hypertension, Pt. on medications and Pt. on home beta blockers ?+ CAD  ? ?Rhythm:Regular Rate:Normal ? ? ?  ?Neuro/Psych ?negative neurological ROS ? negative psych ROS  ? GI/Hepatic ?negative GI ROS, Neg liver ROS,   ?Endo/Other  ?Hypothyroidism  ? Renal/GU ?negative Renal ROS  ?negative genitourinary ?  ?Musculoskeletal ? ?(+) Arthritis , Osteoarthritis,   ? Abdominal ?Normal abdominal exam  (+)   ?Peds ? Hematology ?negative hematology ROS ?(+)   ?Anesthesia Other Findings ? ? Reproductive/Obstetrics ? ?  ? ? ? ? ? ? ? ? ? ? ? ? ? ?  ?  ? ? ? ? ? ? ? ?Anesthesia Physical ?Anesthesia Plan ? ?ASA: 4 ? ?Anesthesia Plan: General  ? ?Post-op Pain Management:   ? ?Induction: Intravenous ? ?PONV Risk Score and Plan: 2 and Midazolam and Treatment may vary due to age or medical condition ? ?Airway Management Planned: Mask and Oral ETT ? ?Additional Equipment: Arterial line, CVP, TEE, 3D TEE and Ultrasound Guidance Line Placement ? ?Intra-op Plan:  ? ?Post-operative Plan: Post-operative intubation/ventilation ? ?Informed Consent: I have reviewed the patients History and Physical, chart, labs and discussed the procedure including the risks, benefits and alternatives for the proposed anesthesia with the patient or authorized representative who has indicated his/her understanding and acceptance.  ? ? ? ?Dental advisory given ? ?Plan Discussed with: CRNA ? ?Anesthesia Plan Comments: (FLOW-TRAC ? ?Cath 02/01/22: ?1. Severe two-vessel coronary artery disease involving the LAD and dominant LCx.  This includes 90%  proximal LAD stenosis and sequential 50% mid, 90% mid/distal, and 90% distal LCx lesions. ?2. Mildly elevated left ventricular filling pressure (LVEDP 20 mmHg). ?3. Tortuous right subclavian/innominate artery limiting catheter manipulation.  Recommend alternative access for future catheterizations. ? ?ECHO 03/23: ??1. Left ventricular ejection fraction, by estimation, is 60 to 65%. The  ?left ventricle has normal function. The left ventricle has no regional  ?wall motion abnormalities. Left ventricular diastolic parameters are  ?consistent with Grade I diastolic  ?dysfunction (impaired relaxation).  ??2. Right ventricular systolic function is normal. The right ventricular  ?size is normal. Tricuspid regurgitation signal is inadequate for assessing  ?PA pressure.  ??3. Left atrial size was mildly dilated.  ??4. The mitral valve is normal in structure. Mild mitral valve  ?regurgitation. No evidence of mitral stenosis.  ??5. The aortic valve was not well visualized. Aortic valve regurgitation  ?is not visualized. No aortic stenosis is present.  ??6. The inferior vena cava is normal in size with greater than 50%  ?respiratory variability, suggesting right atrial pressure of 3 mmHg.  ?Lab Results ?     Component                Value               Date                 ?     WBC                      10.0  02/02/2022           ?     HGB                      16.1                02/02/2022           ?     HCT                      50.3                02/02/2022           ?     MCV                      83.6                02/02/2022           ?     PLT                      222                 02/02/2022           ?Lab Results ?     Component                Value               Date                 ?     NA                       137                 02/02/2022           ?     K                        4.2                 02/02/2022           ?     CO2                      22                  02/02/2022           ?      GLUCOSE                  92                  02/02/2022           ?     BUN                      15                  02/02/2022           ?     CREATININE               1.24                02/02/2022           ?  CALCIUM                  8.6 (L)             02/02/2022           ?     EGFR                     59 (L)              01/30/2022           ?     GFRNONAA                 >60                 02/02/2022          )  ? ? ? ? ? ?Anesthesia Quick Evaluation ? ?

## 2022-02-03 NOTE — Progress Notes (Signed)
VASCULAR LAB ? ? ? ?Pre CABG Dopplers have been performed. ? ?See CV proc for preliminary results. ? ? ?Zamier Eggebrecht, RVT ?02/03/2022, 9:50 AM ? ?

## 2022-02-03 NOTE — Progress Notes (Signed)
?  Transition of Care (TOC) Screening Note ? ? ?Patient Details  ?Name: Dustin Lawrence ?Date of Birth: [birth year] ? ? ?Transition of Care (TOC) CM/SW Contact:    ?Pollie Friar, RN ?Phone Number: ?02/03/2022, 10:11 AM ? ? ? ?Transition of Care Department [facility]) has reviewed patient. We will continue to monitor patient advancement through interdisciplinary progression rounds. If new patient transition needs arise, please place a TOC consult. ?  ?

## 2022-02-04 ENCOUNTER — Inpatient Hospital Stay (HOSPITAL_COMMUNITY)
Admission: AD | Disposition: A | Discharge status: Home / Self Care | Payer: Self-pay | Source: Other Acute Inpatient Hospital | Attending: Thoracic Surgery (Cardiothoracic Vascular Surgery)

## 2022-02-04 ENCOUNTER — Inpatient Hospital Stay (HOSPITAL_COMMUNITY): Payer: PPO

## 2022-02-04 ENCOUNTER — Inpatient Hospital Stay (HOSPITAL_COMMUNITY): Payer: PPO | Admitting: Anesthesiology

## 2022-02-04 ENCOUNTER — Encounter: Payer: Self-pay | Admitting: Internal Medicine

## 2022-02-04 ENCOUNTER — Other Ambulatory Visit: Payer: Self-pay

## 2022-02-04 DIAGNOSIS — I2510 Atherosclerotic heart disease of native coronary artery without angina pectoris: Secondary | ICD-10-CM | POA: Diagnosis present

## 2022-02-04 DIAGNOSIS — J9601 Acute respiratory failure with hypoxia: Secondary | ICD-10-CM

## 2022-02-04 DIAGNOSIS — E039 Hypothyroidism, unspecified: Secondary | ICD-10-CM

## 2022-02-04 DIAGNOSIS — I10 Essential (primary) hypertension: Secondary | ICD-10-CM

## 2022-02-04 DIAGNOSIS — Z951 Presence of aortocoronary bypass graft: Secondary | ICD-10-CM

## 2022-02-04 DIAGNOSIS — E119 Type 2 diabetes mellitus without complications: Secondary | ICD-10-CM

## 2022-02-04 HISTORY — PX: TEE WITHOUT CARDIOVERSION: SHX5443

## 2022-02-04 HISTORY — PX: CORONARY ARTERY BYPASS GRAFT: SHX141

## 2022-02-04 LAB — HEMOGLOBIN AND HEMATOCRIT, BLOOD
HCT: 38.7 % — ABNORMAL LOW (ref 39.0–52.0)
Hemoglobin: 12.5 g/dL — ABNORMAL LOW (ref 13.0–17.0)

## 2022-02-04 LAB — POCT I-STAT 7, (LYTES, BLD GAS, ICA,H+H)
Acid-Base Excess: 0 mmol/L (ref 0.0–2.0)
Acid-Base Excess: 1 mmol/L (ref 0.0–2.0)
Acid-Base Excess: 1 mmol/L (ref 0.0–2.0)
Acid-base deficit: 1 mmol/L (ref 0.0–2.0)
Acid-base deficit: 1 mmol/L (ref 0.0–2.0)
Acid-base deficit: 8 mmol/L — ABNORMAL HIGH (ref 0.0–2.0)
Acid-base deficit: 8 mmol/L — ABNORMAL HIGH (ref 0.0–2.0)
Acid-base deficit: 6 mmol/L — ABNORMAL HIGH (ref 0.0–2.0)
Bicarbonate: 25.1 mmol/L (ref 20.0–28.0)
Bicarbonate: 24.5 mmol/L (ref 20.0–28.0)
Bicarbonate: 24.9 mmol/L (ref 20.0–28.0)
Bicarbonate: 25.4 mmol/L (ref 20.0–28.0)
Bicarbonate: 23.6 mmol/L (ref 20.0–28.0)
Bicarbonate: 16.7 mmol/L — ABNORMAL LOW (ref 20.0–28.0)
Bicarbonate: 19.3 mmol/L — ABNORMAL LOW (ref 20.0–28.0)
Bicarbonate: 21.2 mmol/L (ref 20.0–28.0)
Calcium, Ion: 1.22 mmol/L (ref 1.15–1.40)
Calcium, Ion: 0.97 mmol/L — ABNORMAL LOW (ref 1.15–1.40)
Calcium, Ion: 1.03 mmol/L — ABNORMAL LOW (ref 1.15–1.40)
Calcium, Ion: 1.05 mmol/L — ABNORMAL LOW (ref 1.15–1.40)
Calcium, Ion: 1.23 mmol/L (ref 1.15–1.40)
Calcium, Ion: 0.99 mmol/L — ABNORMAL LOW (ref 1.15–1.40)
Calcium, Ion: 1.13 mmol/L — ABNORMAL LOW (ref 1.15–1.40)
Calcium, Ion: 1.12 mmol/L — ABNORMAL LOW (ref 1.15–1.40)
HCT: 52 % (ref 39.0–52.0)
HCT: 38 % — ABNORMAL LOW (ref 39.0–52.0)
HCT: 36 % — ABNORMAL LOW (ref 39.0–52.0)
HCT: 37 % — ABNORMAL LOW (ref 39.0–52.0)
HCT: 38 % — ABNORMAL LOW (ref 39.0–52.0)
HCT: 38 % — ABNORMAL LOW (ref 39.0–52.0)
HCT: 42 % (ref 39.0–52.0)
HCT: 42 % (ref 39.0–52.0)
Hemoglobin: 17.7 g/dL — ABNORMAL HIGH (ref 13.0–17.0)
Hemoglobin: 12.9 g/dL — ABNORMAL LOW (ref 13.0–17.0)
Hemoglobin: 12.2 g/dL — ABNORMAL LOW (ref 13.0–17.0)
Hemoglobin: 12.6 g/dL — ABNORMAL LOW (ref 13.0–17.0)
Hemoglobin: 12.9 g/dL — ABNORMAL LOW (ref 13.0–17.0)
Hemoglobin: 12.9 g/dL — ABNORMAL LOW (ref 13.0–17.0)
Hemoglobin: 14.3 g/dL (ref 13.0–17.0)
Hemoglobin: 14.3 g/dL (ref 13.0–17.0)
O2 Saturation: 100 %
O2 Saturation: 100 %
O2 Saturation: 100 %
O2 Saturation: 100 %
O2 Saturation: 100 %
O2 Saturation: 96 %
O2 Saturation: 92 %
O2 Saturation: 96 %
Patient temperature: 36.5
Patient temperature: 37.4
Patient temperature: 97.9
Potassium: 4.1 mmol/L (ref 3.5–5.1)
Potassium: 4.4 mmol/L (ref 3.5–5.1)
Potassium: 5.5 mmol/L — ABNORMAL HIGH (ref 3.5–5.1)
Potassium: 5.6 mmol/L — ABNORMAL HIGH (ref 3.5–5.1)
Potassium: 5 mmol/L (ref 3.5–5.1)
Potassium: 3.4 mmol/L — ABNORMAL LOW (ref 3.5–5.1)
Potassium: 4.9 mmol/L (ref 3.5–5.1)
Potassium: 4.6 mmol/L (ref 3.5–5.1)
Sodium: 138 mmol/L (ref 135–145)
Sodium: 137 mmol/L (ref 135–145)
Sodium: 130 mmol/L — ABNORMAL LOW (ref 135–145)
Sodium: 133 mmol/L — ABNORMAL LOW (ref 135–145)
Sodium: 134 mmol/L — ABNORMAL LOW (ref 135–145)
Sodium: 142 mmol/L (ref 135–145)
Sodium: 139 mmol/L (ref 135–145)
Sodium: 140 mmol/L (ref 135–145)
TCO2: 26 mmol/L (ref 22–32)
TCO2: 26 mmol/L (ref 22–32)
TCO2: 26 mmol/L (ref 22–32)
TCO2: 27 mmol/L (ref 22–32)
TCO2: 25 mmol/L (ref 22–32)
TCO2: 18 mmol/L — ABNORMAL LOW (ref 22–32)
TCO2: 21 mmol/L — ABNORMAL LOW (ref 22–32)
TCO2: 23 mmol/L (ref 22–32)
pCO2 arterial: 45.9 mmHg (ref 32–48)
pCO2 arterial: 38.6 mmHg (ref 32–48)
pCO2 arterial: 38.1 mmHg (ref 32–48)
pCO2 arterial: 38.9 mmHg (ref 32–48)
pCO2 arterial: 39.2 mmHg (ref 32–48)
pCO2 arterial: 31.3 mmHg — ABNORMAL LOW (ref 32–48)
pCO2 arterial: 46.2 mmHg (ref 32–48)
pCO2 arterial: 48.3 mmHg — ABNORMAL HIGH (ref 32–48)
pH, Arterial: 7.346 — ABNORMAL LOW (ref 7.35–7.45)
pH, Arterial: 7.41 (ref 7.35–7.45)
pH, Arterial: 7.423 (ref 7.35–7.45)
pH, Arterial: 7.424 (ref 7.35–7.45)
pH, Arterial: 7.389 (ref 7.35–7.45)
pH, Arterial: 7.334 — ABNORMAL LOW (ref 7.35–7.45)
pH, Arterial: 7.231 — ABNORMAL LOW (ref 7.35–7.45)
pH, Arterial: 7.249 — ABNORMAL LOW (ref 7.35–7.45)
pO2, Arterial: 235 mmHg — ABNORMAL HIGH (ref 83–108)
pO2, Arterial: 375 mmHg — ABNORMAL HIGH (ref 83–108)
pO2, Arterial: 310 mmHg — ABNORMAL HIGH (ref 83–108)
pO2, Arterial: 313 mmHg — ABNORMAL HIGH (ref 83–108)
pO2, Arterial: 194 mmHg — ABNORMAL HIGH (ref 83–108)
pO2, Arterial: 87 mmHg (ref 83–108)
pO2, Arterial: 78 mmHg — ABNORMAL LOW (ref 83–108)
pO2, Arterial: 98 mmHg (ref 83–108)

## 2022-02-04 LAB — URINALYSIS, COMPLETE (UACMP) WITH MICROSCOPIC
Bacteria, UA: NONE SEEN
Bilirubin Urine: NEGATIVE
Glucose, UA: NEGATIVE mg/dL
Hgb urine dipstick: NEGATIVE
Ketones, ur: NEGATIVE mg/dL
Leukocytes,Ua: NEGATIVE
Nitrite: NEGATIVE
Protein, ur: NEGATIVE mg/dL
Specific Gravity, Urine: 1.008 (ref 1.005–1.030)
pH: 5 (ref 5.0–8.0)

## 2022-02-04 LAB — POCT I-STAT, CHEM 8
BUN: 19 mg/dL (ref 8–23)
BUN: 17 mg/dL (ref 8–23)
BUN: 17 mg/dL (ref 8–23)
BUN: 18 mg/dL (ref 8–23)
Calcium, Ion: 1.23 mmol/L (ref 1.15–1.40)
Calcium, Ion: 1.15 mmol/L (ref 1.15–1.40)
Calcium, Ion: 1.04 mmol/L — ABNORMAL LOW (ref 1.15–1.40)
Calcium, Ion: 1.25 mmol/L (ref 1.15–1.40)
Chloride: 102 mmol/L (ref 98–111)
Chloride: 103 mmol/L (ref 98–111)
Chloride: 102 mmol/L (ref 98–111)
Chloride: 101 mmol/L (ref 98–111)
Creatinine, Ser: 1.3 mg/dL — ABNORMAL HIGH (ref 0.61–1.24)
Creatinine, Ser: 1.2 mg/dL (ref 0.61–1.24)
Creatinine, Ser: 1.1 mg/dL (ref 0.61–1.24)
Creatinine, Ser: 1.1 mg/dL (ref 0.61–1.24)
Glucose, Bld: 106 mg/dL — ABNORMAL HIGH (ref 70–99)
Glucose, Bld: 114 mg/dL — ABNORMAL HIGH (ref 70–99)
Glucose, Bld: 116 mg/dL — ABNORMAL HIGH (ref 70–99)
Glucose, Bld: 126 mg/dL — ABNORMAL HIGH (ref 70–99)
HCT: 54 % — ABNORMAL HIGH (ref 39.0–52.0)
HCT: 50 % (ref 39.0–52.0)
HCT: 37 % — ABNORMAL LOW (ref 39.0–52.0)
HCT: 38 % — ABNORMAL LOW (ref 39.0–52.0)
Hemoglobin: 18.4 g/dL — ABNORMAL HIGH (ref 13.0–17.0)
Hemoglobin: 17 g/dL (ref 13.0–17.0)
Hemoglobin: 12.6 g/dL — ABNORMAL LOW (ref 13.0–17.0)
Hemoglobin: 12.9 g/dL — ABNORMAL LOW (ref 13.0–17.0)
Potassium: 4.1 mmol/L (ref 3.5–5.1)
Potassium: 4.3 mmol/L (ref 3.5–5.1)
Potassium: 5.4 mmol/L — ABNORMAL HIGH (ref 3.5–5.1)
Potassium: 5 mmol/L (ref 3.5–5.1)
Sodium: 139 mmol/L (ref 135–145)
Sodium: 137 mmol/L (ref 135–145)
Sodium: 134 mmol/L — ABNORMAL LOW (ref 135–145)
Sodium: 135 mmol/L (ref 135–145)
TCO2: 25 mmol/L (ref 22–32)
TCO2: 24 mmol/L (ref 22–32)
TCO2: 27 mmol/L (ref 22–32)
TCO2: 25 mmol/L (ref 22–32)

## 2022-02-04 LAB — CBC
HCT: 45.7 % (ref 39.0–52.0)
HCT: 43.6 % (ref 39.0–52.0)
Hemoglobin: 14.8 g/dL (ref 13.0–17.0)
Hemoglobin: 13.7 g/dL (ref 13.0–17.0)
MCH: 27.5 pg (ref 26.0–34.0)
MCH: 27 pg (ref 26.0–34.0)
MCHC: 32.4 g/dL (ref 30.0–36.0)
MCHC: 31.4 g/dL (ref 30.0–36.0)
MCV: 84.9 fL (ref 80.0–100.0)
MCV: 85.8 fL (ref 80.0–100.0)
Platelets: 170 10*3/uL (ref 150–400)
Platelets: 207 10*3/uL (ref 150–400)
RBC: 5.38 MIL/uL (ref 4.22–5.81)
RBC: 5.08 MIL/uL (ref 4.22–5.81)
RDW: 18.1 % — ABNORMAL HIGH (ref 11.5–15.5)
RDW: 17.7 % — ABNORMAL HIGH (ref 11.5–15.5)
WBC: 18.6 10*3/uL — ABNORMAL HIGH (ref 4.0–10.5)
WBC: 25.2 10*3/uL — ABNORMAL HIGH (ref 4.0–10.5)
nRBC: 0 % (ref 0.0–0.2)
nRBC: 0 % (ref 0.0–0.2)

## 2022-02-04 LAB — PROTIME-INR
INR: 1.1 (ref 0.8–1.2)
INR: 1.3 — ABNORMAL HIGH (ref 0.8–1.2)
Prothrombin Time: 14 seconds (ref 11.4–15.2)
Prothrombin Time: 16 seconds — ABNORMAL HIGH (ref 11.4–15.2)

## 2022-02-04 LAB — BLOOD GAS, ARTERIAL
Acid-Base Excess: 0.8 mmol/L (ref 0.0–2.0)
Bicarbonate: 25.3 mmol/L (ref 20.0–28.0)
O2 Saturation: 95.8 %
Patient temperature: 37
pCO2 arterial: 39 mmHg (ref 32–48)
pH, Arterial: 7.42 (ref 7.35–7.45)
pO2, Arterial: 75 mmHg — ABNORMAL LOW (ref 83–108)

## 2022-02-04 LAB — ECHO INTRAOPERATIVE TEE
AR max vel: 2.02 cm2
AV Area VTI: 2.19 cm2
AV Area mean vel: 2 cm2
AV Mean grad: 3 mmHg
AV Peak grad: 4.8 mmHg
Ao pk vel: 1.1 m/s
Area-P 1/2: 3.54 cm2
Height: 68 in
S' Lateral: 3.3 cm
Weight: 3872 oz

## 2022-02-04 LAB — BASIC METABOLIC PANEL
Anion gap: 6 (ref 5–15)
BUN: 16 mg/dL (ref 8–23)
CO2: 22 mmol/L (ref 22–32)
Calcium: 7.5 mg/dL — ABNORMAL LOW (ref 8.9–10.3)
Chloride: 109 mmol/L (ref 98–111)
Creatinine, Ser: 1.29 mg/dL — ABNORMAL HIGH (ref 0.61–1.24)
GFR, Estimated: 58 mL/min — ABNORMAL LOW (ref >60)
Glucose, Bld: 127 mg/dL — ABNORMAL HIGH (ref 70–99)
Potassium: 5 mmol/L (ref 3.5–5.1)
Sodium: 137 mmol/L (ref 135–145)

## 2022-02-04 LAB — POCT I-STAT EG7
Acid-base deficit: 1 mmol/L (ref 0.0–2.0)
Bicarbonate: 24 mmol/L (ref 20.0–28.0)
Calcium, Ion: 1.04 mmol/L — ABNORMAL LOW (ref 1.15–1.40)
HCT: 39 % (ref 39.0–52.0)
Hemoglobin: 13.3 g/dL (ref 13.0–17.0)
O2 Saturation: 77 %
Potassium: 4.3 mmol/L (ref 3.5–5.1)
Sodium: 137 mmol/L (ref 135–145)
TCO2: 25 mmol/L (ref 22–32)
pCO2, Ven: 39.7 mmHg — ABNORMAL LOW (ref 44–60)
pH, Ven: 7.388 (ref 7.25–7.43)
pO2, Ven: 42 mmHg (ref 32–45)

## 2022-02-04 LAB — SURGICAL PCR SCREEN
MRSA, PCR: NEGATIVE
Staphylococcus aureus: NEGATIVE

## 2022-02-04 LAB — APTT
aPTT: 33 seconds (ref 24–36)
aPTT: 31 seconds (ref 24–36)

## 2022-02-04 LAB — GLUCOSE, CAPILLARY
Glucose-Capillary: 108 mg/dL — ABNORMAL HIGH (ref 70–99)
Glucose-Capillary: 128 mg/dL — ABNORMAL HIGH (ref 70–99)
Glucose-Capillary: 112 mg/dL — ABNORMAL HIGH (ref 70–99)
Glucose-Capillary: 118 mg/dL — ABNORMAL HIGH (ref 70–99)
Glucose-Capillary: 116 mg/dL — ABNORMAL HIGH (ref 70–99)
Glucose-Capillary: 130 mg/dL — ABNORMAL HIGH (ref 70–99)
Glucose-Capillary: 148 mg/dL — ABNORMAL HIGH (ref 70–99)

## 2022-02-04 LAB — PREPARE RBC (CROSSMATCH)

## 2022-02-04 LAB — PLATELET COUNT: Platelets: 238 10*3/uL (ref 150–400)

## 2022-02-04 LAB — MAGNESIUM: Magnesium: 3 mg/dL — ABNORMAL HIGH (ref 1.7–2.4)

## 2022-02-04 SURGERY — CORONARY ARTERY BYPASS GRAFTING (CABG)
Anesthesia: General | Site: Chest

## 2022-02-04 MED ORDER — PANTOPRAZOLE SODIUM 40 MG PO TBEC
40.0000 mg | DELAYED_RELEASE_TABLET | Freq: Every day | ORAL | Status: DC
  Administered 2022-02-06 – 2022-02-09 (×4): 40 mg via ORAL
  Filled 2022-02-04 (×4): qty 1

## 2022-02-04 MED ORDER — ASPIRIN EC 325 MG PO TBEC
325.0000 mg | DELAYED_RELEASE_TABLET | Freq: Every day | ORAL | Status: DC
  Administered 2022-02-05 – 2022-02-09 (×5): 325 mg via ORAL
  Filled 2022-02-04 (×5): qty 1

## 2022-02-04 MED ORDER — VANCOMYCIN HCL IN DEXTROSE 1-5 GM/200ML-% IV SOLN
1000.0000 mg | Freq: Once | INTRAVENOUS | Status: AC
  Administered 2022-02-04: 1000 mg via INTRAVENOUS
  Filled 2022-02-04: qty 200

## 2022-02-04 MED ORDER — LACTATED RINGERS IV SOLN: INTRAVENOUS | Status: DC | PRN

## 2022-02-04 MED ORDER — ALBUMIN HUMAN 5 % IV SOLN
250.0000 mL | INTRAVENOUS | Status: AC | PRN
  Administered 2022-02-04 (×4): 12.5 g via INTRAVENOUS
  Filled 2022-02-04 (×2): qty 250

## 2022-02-04 MED ORDER — SODIUM CHLORIDE (PF) 0.9 % IJ SOLN
INTRAMUSCULAR | Status: AC
  Filled 2022-02-04: qty 10

## 2022-02-04 MED ORDER — TRAMADOL HCL 50 MG PO TABS
50.0000 mg | ORAL_TABLET | ORAL | Status: DC | PRN
  Administered 2022-02-05: 100 mg via ORAL
  Filled 2022-02-04: qty 2

## 2022-02-04 MED ORDER — ACETAMINOPHEN 160 MG/5ML PO SOLN: 1000.0000 mg | Freq: Four times a day (QID) | ORAL | Status: DC

## 2022-02-04 MED ORDER — PROPOFOL 10 MG/ML IV BOLUS
INTRAVENOUS | Status: AC
  Filled 2022-02-04: qty 20

## 2022-02-04 MED ORDER — PROPOFOL 10 MG/ML IV BOLUS
INTRAVENOUS | Status: DC | PRN
  Administered 2022-02-04: 130 mg via INTRAVENOUS

## 2022-02-04 MED ORDER — METOPROLOL TARTRATE 5 MG/5ML IV SOLN: 2.5000 mg | INTRAVENOUS | Status: DC | PRN

## 2022-02-04 MED ORDER — ROCURONIUM BROMIDE 10 MG/ML (PF) SYRINGE
PREFILLED_SYRINGE | INTRAVENOUS | Status: AC
  Filled 2022-02-04: qty 10

## 2022-02-04 MED ORDER — HEPARIN SODIUM (PORCINE) 1000 UNIT/ML IJ SOLN
INTRAMUSCULAR | Status: DC | PRN
  Administered 2022-02-04: 32000 [IU] via INTRAVENOUS

## 2022-02-04 MED ORDER — CHLORHEXIDINE GLUCONATE CLOTH 2 % EX PADS
6.0000 | MEDICATED_PAD | Freq: Every day | CUTANEOUS | Status: DC
  Administered 2022-02-04 – 2022-02-06 (×3): 6 via TOPICAL

## 2022-02-04 MED ORDER — ASPIRIN 81 MG PO CHEW: 324.0000 mg | CHEWABLE_TABLET | Freq: Every day | ORAL | Status: DC

## 2022-02-04 MED ORDER — ACETAMINOPHEN 160 MG/5ML PO SOLN: 650.0000 mg | Freq: Once | ORAL | Status: AC

## 2022-02-04 MED ORDER — NITROGLYCERIN IN D5W 200-5 MCG/ML-% IV SOLN: 0.0000 ug/min | INTRAVENOUS | Status: DC

## 2022-02-04 MED ORDER — FENTANYL CITRATE (PF) 250 MCG/5ML IJ SOLN
INTRAMUSCULAR | Status: AC
  Filled 2022-02-04: qty 5

## 2022-02-04 MED ORDER — SODIUM BICARBONATE 8.4 % IV SOLN
50.0000 meq | Freq: Once | INTRAVENOUS | Status: AC
  Administered 2022-02-04: 50 meq via INTRAVENOUS

## 2022-02-04 MED ORDER — SODIUM CHLORIDE 0.9 % IV SOLN: INTRAVENOUS | Status: DC

## 2022-02-04 MED ORDER — EPHEDRINE 5 MG/ML INJ
INTRAVENOUS | Status: AC
  Filled 2022-02-04: qty 5

## 2022-02-04 MED ORDER — DOCUSATE SODIUM 100 MG PO CAPS
200.0000 mg | ORAL_CAPSULE | Freq: Every day | ORAL | Status: DC
  Administered 2022-02-05 – 2022-02-09 (×4): 200 mg via ORAL
  Filled 2022-02-04 (×5): qty 2

## 2022-02-04 MED ORDER — SODIUM CHLORIDE (PF) 0.9 % IJ SOLN
OROMUCOSAL | Status: DC | PRN
  Administered 2022-02-04 (×2): 4 mL via TOPICAL

## 2022-02-04 MED ORDER — INSULIN REGULAR(HUMAN) IN NACL 100-0.9 UT/100ML-% IV SOLN: INTRAVENOUS | Status: DC

## 2022-02-04 MED ORDER — MIDAZOLAM HCL (PF) 5 MG/ML IJ SOLN
INTRAMUSCULAR | Status: DC | PRN
  Administered 2022-02-04 (×4): 1 mg via INTRAVENOUS

## 2022-02-04 MED ORDER — BIOTIN 1 MG PO CAPS: 1.0000 mg | ORAL_CAPSULE | Freq: Every day | ORAL | Status: DC

## 2022-02-04 MED ORDER — MIDAZOLAM HCL (PF) 10 MG/2ML IJ SOLN
INTRAMUSCULAR | Status: AC
  Filled 2022-02-04: qty 2

## 2022-02-04 MED ORDER — MORPHINE SULFATE (PF) 2 MG/ML IV SOLN
1.0000 mg | INTRAVENOUS | Status: DC | PRN
  Administered 2022-02-04 (×2): 2 mg via INTRAVENOUS
  Administered 2022-02-05 (×2): 4 mg via INTRAVENOUS
  Filled 2022-02-04: qty 2
  Filled 2022-02-04: qty 1
  Filled 2022-02-04: qty 2
  Filled 2022-02-04: qty 1

## 2022-02-04 MED ORDER — 0.9 % SODIUM CHLORIDE (POUR BTL) OPTIME
TOPICAL | Status: DC | PRN
  Administered 2022-02-04: 5000 mL

## 2022-02-04 MED ORDER — HEPARIN SODIUM (PORCINE) 1000 UNIT/ML IJ SOLN
INTRAMUSCULAR | Status: AC
  Filled 2022-02-04: qty 1

## 2022-02-04 MED ORDER — SODIUM CHLORIDE 0.9 % IV SOLN: 250.0000 mL | INTRAVENOUS | Status: DC

## 2022-02-04 MED ORDER — CHLORHEXIDINE GLUCONATE 0.12 % MT SOLN
15.0000 mL | OROMUCOSAL | Status: AC
  Administered 2022-02-04: 15 mL via OROMUCOSAL

## 2022-02-04 MED ORDER — ONDANSETRON HCL 4 MG/2ML IJ SOLN: 4.0000 mg | Freq: Four times a day (QID) | INTRAMUSCULAR | Status: DC | PRN

## 2022-02-04 MED ORDER — ALBUMIN HUMAN 5 % IV SOLN
25.0000 g | Freq: Once | INTRAVENOUS | Status: AC
  Administered 2022-02-04: 25 g via INTRAVENOUS
  Filled 2022-02-04: qty 500

## 2022-02-04 MED ORDER — DEXMEDETOMIDINE HCL IN NACL 400 MCG/100ML IV SOLN
0.0000 ug/kg/h | INTRAVENOUS | Status: DC
  Administered 2022-02-04: 0.7 ug/kg/h via INTRAVENOUS
  Filled 2022-02-04: qty 100

## 2022-02-04 MED ORDER — LACTATED RINGERS IV SOLN: INTRAVENOUS | Status: DC

## 2022-02-04 MED ORDER — HEPARIN SODIUM (PORCINE) 1000 UNIT/ML IJ SOLN
INTRAMUSCULAR | Status: AC
  Filled 2022-02-04: qty 10

## 2022-02-04 MED ORDER — PLASMA-LYTE A IV SOLN
INTRAVENOUS | Status: DC | PRN
  Administered 2022-02-04: 500 mL via INTRAVASCULAR

## 2022-02-04 MED ORDER — MIDAZOLAM HCL 2 MG/2ML IJ SOLN: 2.0000 mg | INTRAMUSCULAR | Status: DC | PRN

## 2022-02-04 MED ORDER — PHENYLEPHRINE 40 MCG/ML (10ML) SYRINGE FOR IV PUSH (FOR BLOOD PRESSURE SUPPORT)
PREFILLED_SYRINGE | INTRAVENOUS | Status: AC
  Filled 2022-02-04: qty 20

## 2022-02-04 MED ORDER — NICARDIPINE HCL IN NACL 20-0.86 MG/200ML-% IV SOLN: 0.0000 mg/h | INTRAVENOUS | Status: DC

## 2022-02-04 MED ORDER — SODIUM CHLORIDE 0.45 % IV SOLN: INTRAVENOUS | Status: DC | PRN

## 2022-02-04 MED ORDER — POTASSIUM CHLORIDE 10 MEQ/50ML IV SOLN
10.0000 meq | INTRAVENOUS | Status: AC
  Administered 2022-02-04 (×3): 10 meq via INTRAVENOUS

## 2022-02-04 MED ORDER — MAGNESIUM SULFATE 4 GM/100ML IV SOLN
4.0000 g | Freq: Once | INTRAVENOUS | Status: AC
  Administered 2022-02-04: 4 g via INTRAVENOUS
  Filled 2022-02-04: qty 100

## 2022-02-04 MED ORDER — METOPROLOL TARTRATE 12.5 MG HALF TABLET
12.5000 mg | ORAL_TABLET | Freq: Two times a day (BID) | ORAL | Status: DC
  Administered 2022-02-05 – 2022-02-07 (×6): 12.5 mg via ORAL
  Filled 2022-02-04 (×6): qty 1

## 2022-02-04 MED ORDER — FAMOTIDINE IN NACL 20-0.9 MG/50ML-% IV SOLN
20.0000 mg | Freq: Two times a day (BID) | INTRAVENOUS | Status: AC
  Administered 2022-02-04: 20 mg via INTRAVENOUS
  Filled 2022-02-04: qty 50

## 2022-02-04 MED ORDER — LACTATED RINGERS IV SOLN: 500.0000 mL | Freq: Once | INTRAVENOUS | Status: DC | PRN

## 2022-02-04 MED ORDER — DEXTROSE 50 % IV SOLN: 0.0000 mL | INTRAVENOUS | Status: DC | PRN

## 2022-02-04 MED ORDER — SODIUM CHLORIDE 0.9% FLUSH: 3.0000 mL | INTRAVENOUS | Status: DC | PRN

## 2022-02-04 MED ORDER — PROTAMINE SULFATE 10 MG/ML IV SOLN
INTRAVENOUS | Status: AC
  Filled 2022-02-04: qty 25

## 2022-02-04 MED ORDER — PROTAMINE SULFATE 10 MG/ML IV SOLN
INTRAVENOUS | Status: DC | PRN
Start: 2022-02-04 — End: 2022-02-04
  Administered 2022-02-04: 30 mg via INTRAVENOUS

## 2022-02-04 MED ORDER — ACETAMINOPHEN 500 MG PO TABS
1000.0000 mg | ORAL_TABLET | Freq: Four times a day (QID) | ORAL | Status: DC
  Administered 2022-02-05 – 2022-02-09 (×16): 1000 mg via ORAL
  Filled 2022-02-04 (×17): qty 2

## 2022-02-04 MED ORDER — FENTANYL CITRATE (PF) 250 MCG/5ML IJ SOLN
INTRAMUSCULAR | Status: DC | PRN
  Administered 2022-02-04: 50 ug via INTRAVENOUS
  Administered 2022-02-04: 100 ug via INTRAVENOUS
  Administered 2022-02-04: 150 ug via INTRAVENOUS
  Administered 2022-02-04 (×2): 50 ug via INTRAVENOUS
  Administered 2022-02-04 (×2): 100 ug via INTRAVENOUS
  Administered 2022-02-04: 50 ug via INTRAVENOUS
  Administered 2022-02-04: 150 ug via INTRAVENOUS
  Administered 2022-02-04: 50 ug via INTRAVENOUS
  Administered 2022-02-04: 100 ug via INTRAVENOUS

## 2022-02-04 MED ORDER — LEVOFLOXACIN IN D5W 750 MG/150ML IV SOLN
750.0000 mg | INTRAVENOUS | Status: AC
  Administered 2022-02-05: 750 mg via INTRAVENOUS
  Filled 2022-02-04: qty 150

## 2022-02-04 MED ORDER — PHENYLEPHRINE 40 MCG/ML (10ML) SYRINGE FOR IV PUSH (FOR BLOOD PRESSURE SUPPORT)
PREFILLED_SYRINGE | INTRAVENOUS | Status: DC | PRN
  Administered 2022-02-04: 80 ug via INTRAVENOUS
  Administered 2022-02-04: 160 ug via INTRAVENOUS
  Administered 2022-02-04: 80 ug via INTRAVENOUS
  Administered 2022-02-04: 40 ug via INTRAVENOUS

## 2022-02-04 MED ORDER — SODIUM CHLORIDE 0.9% FLUSH
10.0000 mL | Freq: Two times a day (BID) | INTRAVENOUS | Status: DC
  Administered 2022-02-04 – 2022-02-06 (×4): 10 mL

## 2022-02-04 MED ORDER — BISACODYL 5 MG PO TBEC
10.0000 mg | DELAYED_RELEASE_TABLET | Freq: Every day | ORAL | Status: DC
  Administered 2022-02-05 – 2022-02-06 (×2): 10 mg via ORAL
  Filled 2022-02-04 (×2): qty 2

## 2022-02-04 MED ORDER — OXYCODONE HCL 5 MG PO TABS
5.0000 mg | ORAL_TABLET | ORAL | Status: DC | PRN
  Administered 2022-02-04 (×2): 10 mg via ORAL
  Administered 2022-02-07: 5 mg via ORAL
  Filled 2022-02-04: qty 2
  Filled 2022-02-04: qty 1
  Filled 2022-02-04: qty 2

## 2022-02-04 MED ORDER — SODIUM CHLORIDE 0.9% FLUSH: 10.0000 mL | INTRAVENOUS | Status: DC | PRN

## 2022-02-04 MED ORDER — ROCURONIUM BROMIDE 10 MG/ML (PF) SYRINGE
PREFILLED_SYRINGE | INTRAVENOUS | Status: DC | PRN
  Administered 2022-02-04: 20 mg via INTRAVENOUS
  Administered 2022-02-04: 30 mg via INTRAVENOUS
  Administered 2022-02-04: 50 mg via INTRAVENOUS
  Administered 2022-02-04: 70 mg via INTRAVENOUS
  Administered 2022-02-04: 50 mg via INTRAVENOUS

## 2022-02-04 MED ORDER — BISACODYL 10 MG RE SUPP: 10.0000 mg | Freq: Every day | RECTAL | Status: DC

## 2022-02-04 MED ORDER — ACETAMINOPHEN 650 MG RE SUPP
650.0000 mg | Freq: Once | RECTAL | Status: AC
  Administered 2022-02-04: 650 mg via RECTAL

## 2022-02-04 MED ORDER — PHENYLEPHRINE HCL-NACL 20-0.9 MG/250ML-% IV SOLN: 0.0000 ug/min | INTRAVENOUS | Status: DC

## 2022-02-04 MED ORDER — METOPROLOL TARTRATE 25 MG/10 ML ORAL SUSPENSION: 12.5000 mg | Freq: Two times a day (BID) | ORAL | Status: DC

## 2022-02-04 MED ORDER — SODIUM CHLORIDE 0.9% FLUSH
3.0000 mL | Freq: Two times a day (BID) | INTRAVENOUS | Status: DC
  Administered 2022-02-05 – 2022-02-06 (×3): 3 mL via INTRAVENOUS

## 2022-02-04 SURGICAL SUPPLY — 92 items
BAG DECANTER FOR FLEXI CONT (MISCELLANEOUS) ×3 IMPLANT
BLADE CLIPPER SURG (BLADE) ×3 IMPLANT
BLADE STERNUM SYSTEM 6 (BLADE) ×3 IMPLANT
BLADE SURG 11 STRL SS (BLADE) ×1 IMPLANT
BNDG ELASTIC 4X5.8 VLCR STR LF (GAUZE/BANDAGES/DRESSINGS) ×3 IMPLANT
BNDG ELASTIC 6X10 VLCR STRL LF (GAUZE/BANDAGES/DRESSINGS) ×1 IMPLANT
BNDG ELASTIC 6X5.8 VLCR STR LF (GAUZE/BANDAGES/DRESSINGS) ×3 IMPLANT
BNDG GAUZE ELAST 4 BULKY (GAUZE/BANDAGES/DRESSINGS) ×3 IMPLANT
CABLE SURGICAL S-101-97-12 (CABLE) ×3 IMPLANT
CANISTER SUCT 3000ML PPV (MISCELLANEOUS) ×3 IMPLANT
CANNULA MC2 2 STG 29/37 NON-V (CANNULA) ×2 IMPLANT
CANNULA MC2 TWO STAGE (CANNULA) ×1
CANNULA NON VENT 20FR 12 (CANNULA) ×3 IMPLANT
CATH ROBINSON RED A/P 18FR (CATHETERS) ×6 IMPLANT
CLIP RETRACTION 3.0MM CORONARY (MISCELLANEOUS) ×2 IMPLANT
CLIP VESOCCLUDE MED 24/CT (CLIP) IMPLANT
CLIP VESOCCLUDE SM WIDE 24/CT (CLIP) IMPLANT
CONN ST 1/2X1/2  BEN (MISCELLANEOUS) ×1
CONN ST 1/2X1/2 BEN (MISCELLANEOUS) ×2 IMPLANT
CONNECTOR BLAKE 2:1 CARIO BLK (MISCELLANEOUS) ×3 IMPLANT
CONTAINER PROTECT SURGISLUSH (MISCELLANEOUS) ×6 IMPLANT
DERMABOND ADHESIVE PROPEN (GAUZE/BANDAGES/DRESSINGS) ×1
DERMABOND ADVANCED .7 DNX6 (GAUZE/BANDAGES/DRESSINGS) IMPLANT
DRAIN CHANNEL 19F RND (DRAIN) ×8 IMPLANT
DRAIN CONNECTOR BLAKE 1:1 (MISCELLANEOUS) ×3 IMPLANT
DRAPE CARDIOVASCULAR INCISE (DRAPES) ×1
DRAPE INCISE IOBAN 66X45 STRL (DRAPES) IMPLANT
DRAPE SRG 135X102X78XABS (DRAPES) ×2 IMPLANT
DRAPE WARM FLUID 44X44 (DRAPES) ×3 IMPLANT
DRSG AQUACEL AG ADV 3.5X10 (GAUZE/BANDAGES/DRESSINGS) ×3 IMPLANT
DRSG AQUACEL AG ADV 3.5X14 (GAUZE/BANDAGES/DRESSINGS) ×1 IMPLANT
DRSG COVADERM 4X14 (GAUZE/BANDAGES/DRESSINGS) ×3 IMPLANT
ELECT BLADE 4.0 EZ CLEAN MEGAD (MISCELLANEOUS) ×3
ELECT REM PT RETURN 9FT ADLT (ELECTROSURGICAL) ×6
ELECTRODE BLDE 4.0 EZ CLN MEGD (MISCELLANEOUS) ×2 IMPLANT
ELECTRODE REM PT RTRN 9FT ADLT (ELECTROSURGICAL) ×4 IMPLANT
FELT TEFLON 1X6 (MISCELLANEOUS) ×5 IMPLANT
GAUZE 4X4 16PLY ~~LOC~~+RFID DBL (SPONGE) ×3 IMPLANT
GAUZE SPONGE 4X4 12PLY STRL (GAUZE/BANDAGES/DRESSINGS) ×6 IMPLANT
GLOVE SURG ENC MOIS LTX SZ7 (GLOVE) ×6 IMPLANT
GLOVE SURG ENC TEXT LTX SZ7.5 (GLOVE) ×7 IMPLANT
GLOVE SURG MICRO LTX SZ6 (GLOVE) ×3 IMPLANT
GLOVE SURG MICRO LTX SZ6.5 (GLOVE) ×4 IMPLANT
GOWN STRL REUS W/ TWL LRG LVL3 (GOWN DISPOSABLE) ×8 IMPLANT
GOWN STRL REUS W/ TWL XL LVL3 (GOWN DISPOSABLE) ×4 IMPLANT
GOWN STRL REUS W/TWL LRG LVL3 (GOWN DISPOSABLE) ×7
GOWN STRL REUS W/TWL XL LVL3 (GOWN DISPOSABLE) ×2
HEMOSTAT POWDER SURGIFOAM 1G (HEMOSTASIS) ×8 IMPLANT
INSERT SUTURE HOLDER (MISCELLANEOUS) ×3 IMPLANT
KIT BASIN OR (CUSTOM PROCEDURE TRAY) ×3 IMPLANT
KIT SUCTION CATH 14FR (SUCTIONS) ×3 IMPLANT
KIT TURNOVER KIT B (KITS) ×3 IMPLANT
KIT VASOVIEW HEMOPRO 2 VH 4000 (KITS) ×3 IMPLANT
LEAD PACING MYOCARDI (MISCELLANEOUS) ×6 IMPLANT
MARKER GRAFT CORONARY BYPASS (MISCELLANEOUS) ×9 IMPLANT
NS IRRIG 1000ML POUR BTL (IV SOLUTION) ×15 IMPLANT
PACK ACCESSORY CANNULA KIT (KITS) ×3 IMPLANT
PACK E OPEN HEART (SUTURE) ×3 IMPLANT
PACK OPEN HEART (CUSTOM PROCEDURE TRAY) ×3 IMPLANT
PAD ARMBOARD 7.5X6 YLW CONV (MISCELLANEOUS) ×6 IMPLANT
PAD ELECT DEFIB RADIOL ZOLL (MISCELLANEOUS) ×3 IMPLANT
PENCIL BUTTON HOLSTER BLD 10FT (ELECTRODE) ×3 IMPLANT
POSITIONER HEAD DONUT 9IN (MISCELLANEOUS) ×3 IMPLANT
PUNCH AORTIC ROTATE 4.0MM (MISCELLANEOUS) ×3 IMPLANT
SET MPS 3-ND DEL (MISCELLANEOUS) ×1 IMPLANT
SPONGE T-LAP 18X18 ~~LOC~~+RFID (SPONGE) ×12 IMPLANT
SUPPORT HEART JANKE-BARRON (MISCELLANEOUS) ×3 IMPLANT
SUT BONE WAX W31G (SUTURE) ×3 IMPLANT
SUT ETHIBOND X763 2 0 SH 1 (SUTURE) ×6 IMPLANT
SUT MNCRL AB 3-0 PS2 18 (SUTURE) ×6 IMPLANT
SUT MNCRL AB 4-0 PS2 18 (SUTURE) ×1 IMPLANT
SUT PDS AB 1 CTX 36 (SUTURE) ×6 IMPLANT
SUT PROLENE 4 0 RB 1 (SUTURE) ×1
SUT PROLENE 4 0 SH DA (SUTURE) ×3 IMPLANT
SUT PROLENE 4-0 RB1 .5 CRCL 36 (SUTURE) IMPLANT
SUT PROLENE 5 0 C 1 36 (SUTURE) ×9 IMPLANT
SUT PROLENE 7 0 BV1 MDA (SUTURE) ×3 IMPLANT
SUT STEEL 6MS V (SUTURE) ×6 IMPLANT
SUT STEEL STERNAL CCS#1 18IN (SUTURE) ×3 IMPLANT
SUT VIC AB 1 CTX 36 (SUTURE) ×1
SUT VIC AB 1 CTX36XBRD ANBCTR (SUTURE) IMPLANT
SUT VIC AB 2-0 CT1 27 (SUTURE) ×1
SUT VIC AB 2-0 CT1 TAPERPNT 27 (SUTURE) IMPLANT
SYSTEM SAHARA CHEST DRAIN ATS (WOUND CARE) ×3 IMPLANT
TAPE CLOTH 4X10 WHT NS (GAUZE/BANDAGES/DRESSINGS) ×1 IMPLANT
TAPE PAPER 2X10 WHT MICROPORE (GAUZE/BANDAGES/DRESSINGS) ×1 IMPLANT
TOWEL GREEN STERILE (TOWEL DISPOSABLE) ×3 IMPLANT
TOWEL GREEN STERILE FF (TOWEL DISPOSABLE) ×3 IMPLANT
TRAY FOLEY SLVR 16FR TEMP STAT (SET/KITS/TRAYS/PACK) ×3 IMPLANT
TUBING LAP HI FLOW INSUFFLATIO (TUBING) ×3 IMPLANT
UNDERPAD 30X36 HEAVY ABSORB (UNDERPADS AND DIAPERS) ×3 IMPLANT
WATER STERILE IRR 1000ML POUR (IV SOLUTION) ×6 IMPLANT

## 2022-02-04 NOTE — Progress Notes (Signed)
?   ?  [address] ?      [name] [address] ?            [phone]      ? ?No events ? ?Vitals:  ? 02/03/22 2357 02/04/22 0414  ?BP: (!) 142/66 (!) 158/79  ?Pulse: 62 74  ?Resp: 18 18  ?Temp: 98.1 ?F (36.7 ?C) 98.1 ?F (36.7 ?C)  ?SpO2:    ? ?Alert NAD ?Sinus ?EWOB ? ?76yo male with 2V CAD ?OR today for CABG ? ?Dustin Lawrence ? ?

## 2022-02-04 NOTE — Transfer of Care (Signed)
Immediate Anesthesia Transfer of Care Note ? ?Patient: Dustin Lawrence ? ?Procedure(s) Performed: CORONARY ARTERY BYPASS GRAFTING (CABG) X TWO, USING LEFT INTERNAL MAMMARY ARTERY AND RIGHT LEG GREATER SAPHENOUS VEIN HARVESTED ENDOSCOPICALLY (Chest) ?TRANSESOPHAGEAL ECHOCARDIOGRAM (TEE) ? ?Patient Location: SICU ? ?Anesthesia Type:General ? ?Level of Consciousness: Patient remains intubated per anesthesia plan ? ?Airway & Oxygen Therapy: Patient remains intubated per anesthesia plan ? ?Post-op Assessment: Report given to RN and Post -op Vital signs reviewed and stable ? ?Post vital signs: Reviewed and stable ? ?Last Vitals:  ?Vitals Value Taken Time  ?BP 138/70 02/04/22 1403  ?Temp 36.4 ?C 02/04/22 1409  ?Pulse 64 02/04/22 1409  ?Resp 15 02/04/22 1409  ?SpO2 97 % 02/04/22 1409  ?Vitals shown include unvalidated device data. ? ?Last Pain:  ?Vitals:  ? 02/04/22 0752  ?TempSrc: Oral  ?PainSc:   ?   ? ?  ? ?Complications: No notable events documented. ?

## 2022-02-04 NOTE — Anesthesia Postprocedure Evaluation (Signed)
Anesthesia Post Note ? ?Patient: MERLYN CONLEY ? ?Procedure(s) Performed: CORONARY ARTERY BYPASS GRAFTING (CABG) X TWO, USING LEFT INTERNAL MAMMARY ARTERY AND RIGHT LEG GREATER SAPHENOUS VEIN HARVESTED ENDOSCOPICALLY (Chest) ?TRANSESOPHAGEAL ECHOCARDIOGRAM (TEE) ? ?  ? ?Patient location during evaluation: SICU ?Anesthesia Type: General ?Level of consciousness: sedated ?Pain management: pain level controlled ?Vital Signs Assessment: post-procedure vital signs reviewed and stable ?Respiratory status: patient remains intubated per anesthesia plan ?Cardiovascular status: stable ?Postop Assessment: no apparent nausea or vomiting ?Anesthetic complications: no ? ? ?No notable events documented. ? ?Last Vitals:  ?Vitals:  ? 02/04/22 1615 02/04/22 1630  ?BP: 133/62   ?Pulse: 84 84  ?Resp: 16 14  ?Temp: 37 ?C 36.9 ?C  ?SpO2: 99% 99%  ?  ?Last Pain:  ?Vitals:  ? 02/04/22 1619  ?TempSrc:   ?PainSc: Asleep  ? ? ?  ?  ?  ?  ?  ?  ? ?March Rummage Dillin Lofgren ? ? ? ? ?

## 2022-02-04 NOTE — Anesthesia Procedure Notes (Signed)
Arterial Line Insertion ?Start/End3/20/2023 8:10 AM, 02/04/2022 8:20 AM ?Performed by: CRNA ? Patient location: Pre-op. ?Preanesthetic checklist: patient identified, IV checked, site marked, risks and benefits discussed, surgical consent, monitors and equipment checked, pre-op evaluation, timeout performed and anesthesia consent ?Lidocaine 1% used for infiltration ?Right, radial was placed ?Catheter size: 20 G ?Hand hygiene performed  and maximum sterile barriers used  ? ?Attempts: 2 ?Procedure performed without using ultrasound guided technique. ?Following insertion, dressing applied and Biopatch. ?Post procedure assessment: normal ? ?Patient tolerated the procedure well with no immediate complications. ? ? ?

## 2022-02-04 NOTE — Consult Note (Signed)
? ?NAME:  Dustin Lawrence, MRN:  [id number], DOB:  1946/10/04, LOS: 3 ?ADMISSION DATE:  02/01/2022, CONSULTATION DATE:  02/04/2022 ?REFERRING MD:  Kipp Brood - TCTS, CHIEF COMPLAINT:  post-CABG  ? ?History of Present Illness:  ?77 year old who underwent CABG x 2 for CAD discovered as part of a VT evaluation.  ? ?Patient apparently developed palpitations which were originally felt to be arising from the LVOT.  ? ?Cardiac catheterization as part of this evaluation showed severe 2 vessel coronary disease, with an EF of 60%  ? ? ?Pertinent  Medical History  ? ?Past Medical History:  ?Diagnosis Date  ? Cancer [facility])   ? skin  ? Dyslipidemia   ? History of chicken pox   ? History of Graves' disease 1981-1986  ? History of migraines   ? Hypertension   ? Hypothyroidism   ? Dr. [name] Endo, goal TSH 1.0  ? Obesity   ? Seasonal allergies   ? Sustained ventricular tachycardia   ? ? ?Significant Hospital Events: ?Including procedures, antibiotic start and stop dates in addition to other pertinent events   ?CABG X 2.  LIMA LAD, RSVG OM2, Endoscopic greater saphenous vein harvest on the right ? ?Interim History / Subjective:  ?X clamp 41, pump 84 min.  1343 ml fluid intake. EF 30% coming off pump.  ? ?Objective   ?Blood pressure 138/70, pulse 62, temperature 97.9 ?F (36.6 ?C), temperature source Oral, resp. rate 12, height '5\' 9"'$  (1.753 m), weight 108.9 kg, SpO2 98 %. ?   ?Vent Mode: SIMV;PRVC;PSV ?FiO2 (%):  [50 %] 50 % ?Set Rate:  [12 bmp] 12 bmp ?Vt Set:  [560 mL] 560 mL ?PEEP:  [5 cmH20] 5 cmH20 ?Pressure Support:  [10 cmH20] 10 cmH20  ? ?Intake/Output Summary (Last 24 hours) at 02/04/2022 1613 ?Last data filed at 02/04/2022 1315 ?Gross per 24 hour  ?Intake 3601 ml  ?Output 2450 ml  ?Net 1151 ml  ? ?Filed Weights  ? 02/04/22 0752  ?Weight: 108.9 kg  ? ?Examination: ?General: intubated, overweight.  ?HENT: orally intubated with OGT in place.  ?Lungs: clear to auscultation bilaterally.  ?Cardiovascular: HS distant with no rub.  Midline incision dressed. Minimal thin pericardial drainage.  ?Abdomen: soft and non-tender.  ?Extremities: right saphenous vein harvest. No edema. Extremities warm ?Neuro: waking up and following commands.  ?GU: Clear urine and following commands.  ? ?Ancillary tests personally reviewed:   ?CXR lines in place.  ? ?Assessment & Plan:  ?Critically ill due postoperative expected respiratory failure require mechanical ventilation ?Critically ill due to postop vasoplegia from pump run requiring titration of vasopressors ?Coronary artery disease ?Dyslipidemia ?Prior sustained ventricular tachycardia ?Hypothyroidism ? ? ?Plan: ? ?-Momentary drop in blood pressure with loss of pacer capture.  Started on epinephrine with increase in phenylephrine.  Blood pressure corrected with art line repositioning, IV fluids and increase pacemaker output and set rate. ?-Hemodynamics have improved.  Point-of-care echo showed no evidence of tamponade.  LV appears to be feeling appropriately with EF approximately 30% as previously described. ?-Continue with fast-track extubation ? ?Best Practice (right click and "Reselect all SmartList Selections" daily)  ? ?Diet/type: NPO ?DVT prophylaxis: not indicated ?GI prophylaxis: H2B ?Lines: Central line and Arterial Line ?Foley:  Yes, and it is still needed ?Code Status:  full code ?Last date of multidisciplinary goals of care discussion [Per primary team.] ? ?CRITICAL CARE ?Performed by: Kipp Brood ? ? ?Total critical care time: 45 minutes ? ?Critical care time was exclusive of  separately billable procedures and treating other patients. ? ?Critical care was necessary to treat or prevent imminent or life-threatening deterioration. ? ?Critical care was time spent personally by me on the following activities: development of treatment plan with patient and/or surrogate as well as nursing, discussions with consultants, evaluation of patient's response to treatment, examination of patient, obtaining  history from patient or surrogate, ordering and performing treatments and interventions, ordering and review of laboratory studies, ordering and review of radiographic studies, pulse oximetry, re-evaluation of patient's condition and participation in multidisciplinary rounds. ? ?Kipp Brood, MD [facility] ?ICU Physician ?[place]  ?Pager: [phone] ?Mobile: [phone] ?After hours: [phone]. ? ? ?02/04/2022, 5:01 PM ? ? ? ? ? ?  ?

## 2022-02-04 NOTE — Discharge Instructions (Addendum)
Discharge Instructions: ? ?1. You may shower, please wash incisions daily with soap and water and keep dry.  If you wish to cover wounds with dressing you may do so but please keep clean and change daily.  No tub baths or swimming until incisions have completely healed.  If your incisions become red or develop any drainage please call our office at [phone] ? ?2. No Driving until cleared by Dr. Abran Duke office and you are no longer using narcotic pain medications ? ?3. Monitor your weight daily.. Please use the same scale and weigh at same time... If you gain 5-10 lbs in 48 hours with associated lower extremity swelling, please contact our office at [phone] ? ?4. Fever of 101.5 for at least 24 hours with no source, please contact our office at [phone] ? ?5. Activity- up as tolerated, please walk at least 3 times per day.  Avoid strenuous activity, no lifting, pushing, or pulling with your arms over 8-10 lbs for a minimum of 6 weeks ? ?6. If any questions or concerns arise, please do not hesitate to contact our office at [phone]  ? ?Prediabetes Eating Plan ?Prediabetes is a condition that causes blood sugar (glucose) levels to be higher than normal. This increases the risk for developing type 2 diabetes (type 2 diabetes mellitus). Working with a health care provider or nutrition specialist (dietitian) to make diet and lifestyle changes can help prevent the onset of diabetes. These changes may help you: ?Control your blood glucose levels. ?Improve your cholesterol levels. ?Manage your blood pressure. ?What are tips for following this plan? ?Reading food labels ?Read food labels to check the amount of fat, salt (sodium), and sugar in prepackaged foods. Avoid foods that have: ?Saturated fats. ?Trans fats. ?Added sugars. ?Avoid foods that have more than 300 milligrams (mg) of sodium per serving. Limit your sodium intake to less than 2,300 mg each day. ?Shopping ?Avoid buying pre-made and processed  foods. ?Avoid buying drinks with added sugar. ?Cooking ?Cook with olive oil. Do not use butter, lard, or ghee. ?Bake, broil, grill, steam, or boil foods. Avoid frying. ?Meal planning ?Work with your dietitian to create an eating plan that is right for you. This may include tracking how many calories you take in each day. Use a food diary, notebook, or mobile application to track what you eat at each meal. ?Consider following a Mediterranean diet. This includes: ?Eating several servings of fresh fruits and vegetables each day. ?Eating fish at least twice a week. ?Eating one serving each day of whole grains, beans, nuts, and seeds. ?Using olive oil instead of other fats. ?Limiting alcohol. ?Limiting red meat. ?Using nonfat or low-fat dairy products. ?Consider following a plant-based diet. This includes dietary choices that focus on eating mostly vegetables and fruit, grains, beans, nuts, and seeds. ?If you have high blood pressure, you may need to limit your sodium intake or follow a diet such as the DASH (Dietary Approaches to Stop Hypertension) eating plan. The DASH diet aims to lower high blood pressure. ?Lifestyle ?Set weight loss goals with help from your health care team. It is recommended that most people with prediabetes lose 7% of their body weight. ?Exercise for at least 30 minutes 5 or more days a week. ?Attend a support group or seek support from a mental health counselor. ?Take over-the-counter and prescription medicines only as told by your health care provider. ?What foods are recommended? ?Fruits ?Berries. Bananas. Apples. Oranges. Grapes. Papaya. Mango. Pomegranate. Kiwi. Grapefruit. Cherries. ?Vegetables ?Lettuce. Spinach.  Peas. Beets. Cauliflower. Cabbage. Broccoli. Carrots. Tomatoes. Squash. Eggplant. Herbs. Peppers. Onions. Cucumbers. Brussels sprouts. ?Grains ?Whole grains, such as whole-wheat or whole-grain breads, crackers, cereals, and pasta. Unsweetened oatmeal. Bulgur. Barley. Quinoa. Brown  rice. Corn or whole-wheat flour tortillas or taco shells. ?Meats and other proteins ?Seafood. Poultry without skin. Lean cuts of pork and beef. Tofu. Eggs. Nuts. Beans. ?Dairy ?Low-fat or fat-free dairy products, such as yogurt, cottage cheese, and cheese. ?Beverages ?Water. Tea. Coffee. Sugar-free or diet soda. Seltzer water. Low-fat or nonfat milk. Milk alternatives, such as soy or almond milk. ?Fats and oils ?Olive oil. Canola oil. Sunflower oil. Grapeseed oil. Avocado. Walnuts. ?Sweets and desserts ?Sugar-free or low-fat pudding. Sugar-free or low-fat ice cream and other frozen treats. ?Seasonings and condiments ?Herbs. Sodium-free spices. Mustard. Relish. Low-salt, low-sugar ketchup. Low-salt, low-sugar barbecue sauce. Low-fat or fat-free mayonnaise. ?The items listed above may not be a complete list of recommended foods and beverages. Contact a dietitian for more information. ?What foods are not recommended? ?Fruits ?Fruits canned with syrup. ?Vegetables ?Canned vegetables. Frozen vegetables with butter or cream sauce. ?Grains ?Refined white flour and flour products, such as bread, pasta, snack foods, and cereals. ?Meats and other proteins ?Fatty cuts of meat. Poultry with skin. Breaded or fried meat. Processed meats. ?Dairy ?Full-fat yogurt, cheese, or milk. ?Beverages ?Sweetened drinks, such as iced tea and soda. ?Fats and oils ?Butter. Lard. Ghee. ?Sweets and desserts ?Baked goods, such as cake, cupcakes, pastries, cookies, and cheesecake. ?Seasonings and condiments ?Spice mixes with added salt. Ketchup. Barbecue sauce. Mayonnaise. ?The items listed above may not be a complete list of foods and beverages that are not recommended. Contact a dietitian for more information. ?Where to find more information ?American Diabetes Association: www.diabetes.org ?Summary ?You may need to make diet and lifestyle changes to help prevent the onset of diabetes. These changes can help you control blood sugar, improve  cholesterol levels, and manage blood pressure. ?Set weight loss goals with help from your health care team. It is recommended that most people with prediabetes lose 7% of their body weight. ?Consider following a Mediterranean diet. This includes eating plenty of fresh fruits and vegetables, whole grains, beans, nuts, seeds, fish, and low-fat dairy, and using olive oil instead of other fats. ?This information is not intended to replace advice given to you by your health care provider. Make sure you discuss any questions you have with your health care provider. ?Document Revised: 02/03/2020 Document Reviewed: 02/03/2020 ?Elsevier Patient Education ? 2022 [place]. ?  ? ?

## 2022-02-04 NOTE — Anesthesia Procedure Notes (Signed)
Procedure Name: Intubation ?Date/Time: 02/04/2022 8:53 AM ?Performed by: Lorie Phenix, CRNA ?Pre-anesthesia Checklist: Patient identified, Emergency Drugs available, Suction available and Patient being monitored ?Patient Re-evaluated:Patient Re-evaluated prior to induction ?Oxygen Delivery Method: Circle system utilized ?Preoxygenation: Pre-oxygenation with 100% oxygen ?Induction Type: IV induction ?Ventilation: Oral airway inserted - appropriate to patient size and Two handed mask ventilation required ?Laryngoscope Size: Mac and 4 ?Grade View: Grade III ?Tube type: Oral ?Tube size: 8.0 mm ?Number of attempts: 1 ?Airway Equipment and Method: Stylet ?Placement Confirmation: ETT inserted through vocal cords under direct vision, positive ETCO2 and breath sounds checked- equal and bilateral ?Secured at: 24 cm ?Tube secured with: Tape ?Dental Injury: Teeth and Oropharynx as per pre-operative assessment  ? ? ? ? ?

## 2022-02-04 NOTE — Discharge Summary (Signed)
Physician Discharge Summary  ? ? ?   ?[address] ?      [name] [address] ?            [phone]   ? ?Patient ID: ?Dustin Lawrence ?MRN: [id number] ?DOB/AGE: Jun 11, 1946 76 y.o. ? ?Admit date: 02/01/2022 ?Discharge date:02/09/2022 ? ?Admission Diagnoses: ?Ventricular tachycardia ?2. Coronary artery disease ? ?Discharge Diagnoses:  ?S/p CABG x 2 ?2. Expected post op blood loss anemia ?3. History of the following: ?Cancer ([place])     ?  skin  ? Dyslipidemia    ? History of chicken pox    ? History of Graves' disease 1981-1986  ? History of migraines    ? Hypertension    ? Hypothyroidism    ?  Dr. [name] Endo, goal TSH 1.0  ? Obesity    ? Seasonal allergies    ? Sustained ventricular tachycardia    ?Pneumonia ?Cellulitis left leg ? ? ?HPI: ?This is a 76 year old male with a past medical history of recently diagnosed sustained VT, hypertension, hyperlipidemia, prior hyperthyroidism/Graves' disease with current hypothyroidism, obesity, who presented to [facility] 02/01/2022 where he was scheduled for a cardiac catheterization and was scheduled for a cardiac MRI (in April). ?  ?Per history, patient was placed on Zio patch in February 2023. A summary of the results showed 19 episodes of SVT, isolated PACs, atrial couplets . Ventricular bigeminy and trigeminy were seen as well. Also of note, TSH recently was low so Levothyroxine was decreased from 200 mcg to 175 mcg daily.  ?Patient noticed less palpitations since change in Levothyroxine. ?He was seen by Dr. Quentin Ore (EP) on 01/30/2022.  Per EP, it was felt his VT was outflow tract in origin. Toprol XL was titrated to 50 mg two times daily. ?  ?Echo done 01/30/2022 showed LVEF 60-65%, mild MR. ?Cardiac catheterization done 02/01/2022 showed severe 2 vessel coronary artery disease, specifically 90% proximal LAD stenosis and 90% mid/distal Circumflex. Patient was transferred to [facility] for further evaluation and treatment.  Cardiothoracic consultation was requested with Dr. Kipp Brood for the consideration of coronary artery bypass grafting surgery.  ? ?Dr. Kipp Brood discussed the need for coronary artery bypass grafting surgery. Potential risks, benefits, and complications of the surgery were discussed with the patient and he agreed to proceed with surgery. Pre operative carotid duplex US showed no significant internal carotid artery stenosis bilaterally. ? ?Hospital Course: ?Patient underwent a CABG x 2. He was transferred from the OR to [facility] ICU in stable condition. He was extubated the evening of surgery.  He was weaned off Neo-synephrine as hemodynamics allowed.  He was in NSR and his pacing wires were removed without difficulty.  He also underwent removal of arterial line, Swan Ganz, a line, chest tubes, and foley. He was started on low dose Lopressor. He had expected post op blood loss anemia. He did not require a transfusion. His last H and H was up to 12.1 and 38.2. He was transitioned off the Insulin drip. His pre op HGA1C is 6. Accu checks and SS PRN will be stopped after transfer to the floor. He was volume overloaded and diuresed accordingly with careful monitoring of creatinine.  Marland Kitchen  He did have an acute renal insufficiency and creatinine peaked at 1.53.  Most recent creatinine on 02/08/2022 was 1.33 with a normal BUN.  He was to be discharged on 5 additional days of Lasix.  He required HFNC post op but was then weaned  to [place]. He is felt surgically stable for transfer from the ICU to 4E for further convalescence on 03/22. He continued to maintain sinus rhythm with some ventricular ectopy but nothing sustained.He has been tolerating a diet and has had a bowel movement. All wounds are clean, dry, and healing without signs of infection. He was weaned to room air and had good oxygenation.  Overall at the time of discharge the patient is felt to be quite stable. ? ? ?Consults: None ? ?Procedure (s):  ?CABG X 2.  LIMA LAD, RSVG OM2    ?Endoscopic greater saphenous vein harvest on the right by Dr. Kipp Brood on 02/04/2022. ? ?Latest Vital Signs: ?Blood pressure (!) 129/50, pulse 78, temperature 98.3 ?F (36.8 ?C), temperature source Oral, resp. rate 16, height '5\' 9"'$  (1.753 m), weight 112.4 kg, SpO2 94 %. ? ?Physical Exam: ?General appearance: alert, cooperative, and no distress ?Heart: regular rate and rhythm and occas extrasystole ?Lungs: clear to auscultation bilaterally ?Abdomen: obese, benign ?Extremities: trace edema ?Wound: incis healing we ?Discharge Condition: Stable and discharged to home. ? ?Recent laboratory studies:  ?Lab Results  ?Component Value Date  ? WBC 14.5 (H) 02/07/2022  ? HGB 12.1 (L) 02/07/2022  ? HCT 38.2 (L) 02/07/2022  ? MCV 85.7 02/07/2022  ? PLT 172 02/07/2022  ? ?Lab Results  ?Component Value Date  ? NA 137 02/08/2022  ? K 4.2 02/08/2022  ? CL 105 02/08/2022  ? CO2 24 02/08/2022  ? CREATININE 1.33 (H) 02/08/2022  ? GLUCOSE 106 (H) 02/08/2022  ? ? ?  ?Diagnostic Studies: DG Chest 2 View ? ?Result Date: 02/07/2022 ?CLINICAL DATA:  Sustained ventricular tachycardia. EXAM: CHEST - 2 VIEW COMPARISON:  Portable chest yesterday at 5:19 a.m. FINDINGS: PA Lat exam is obtained  at 4:46 a.m., 02/07/2022. Interval removal of right IJ catheter and introducer sheath. Sternotomy sutures are intact and midline. There is no visible pneumothorax with CABG change again shown. Small left pleural effusion with overlying atelectasis or consolidation is unchanged allowing for differences in technique. The heart is enlarged. No vascular congestion is seen. There is aortic atherosclerosis, stable mediastinum. The remaining lungs clear. The right sulci are sharp. Overall aeration seems unchanged. IMPRESSION: No significant change in the overall aeration. Small left pleural effusion continues to be seen with overlying atelectasis or consolidation. There is cardiomegaly with CABG changes without evidence of CHF. Electronically Signed   By: Telford Nab M.D.   On: 02/07/2022 05:31  ? ?DG Chest 2 View ? ?Result Date: 02/04/2022 ?CLINICAL DATA:  76 year old male with sustained ventricular tachycardia. Preoperative study for CABG. EXAM: CHEST - 2 VIEW COMPARISON:  Portable chest 04/03/2018. FINDINGS: AP and lateral views of the chest. Mild cardiomegaly. Other mediastinal contours are within normal limits. Lung volumes are stable since 2019, mildly low. No pneumothorax, pulmonary edema, pleural effusion or confluent pulmonary opacity. Abdominal Calcified aortic atherosclerosis. Negative visible bowel gas. Flowing osteophytes in the thoracic spine may result in levels of interbody ankylosis. IMPRESSION: 1. No acute cardiopulmonary abnormality. 2. Mild cardiomegaly. Electronically Signed   By: Genevie Ann M.D.   On: 02/04/2022 08:24  ? ?CARDIAC CATHETERIZATION ? ?Result Date: 02/01/2022 ?Conclusions: Severe two-vessel coronary artery disease involving the LAD and dominant LCx.  This includes 90% proximal LAD stenosis and sequential 50% mid, 90% mid/distal, and 90% distal LCx lesions. Mildly elevated left ventricular filling pressure (LVEDP 20 mmHg). Tortuous right subclavian/innominate artery limiting catheter manipulation.  Recommend alternative access for future catheterizations. Recommendations: Transfer to [facility]  for cardiac surgery consultation.  Given multiple episodes of sustained ventricular tachycardia on recent event monitor, I recommend CABG or multivessel PCI before the patient is discharged home. Aggressive secondary prevention; will add atorvastatin 80 mg daily. Continue metoprolol for medical treatment of ventricular ectopy. Nelva Bush, MD [facility] ? ?DG Chest Port 1 View ? ?Result Date: 02/06/2022 ?CLINICAL DATA:  Encounter for pneumothorax. EXAM: PORTABLE CHEST 1 VIEW COMPARISON:  Portable chest yesterday at 5:25 a.m. FINDINGS: Right IJ catheter/sheath introducer sheath complex interval stable positioning with tip at the  brachiocephalic/SVC junction. Interval removal left basilar chest tube and mediastinal drain. No visible pneumothorax. CABG changes are unaltered. The heart enlarged, with stable mediastinal widening again shown. Aortic atherosc

## 2022-02-04 NOTE — Procedures (Signed)
Extubation Procedure Note ? ?Patient Details:   ?Name: Dustin Lawrence ?DOB: [birth year] ?MRN: [id number] ?  ?Airway Documentation:  ? Patient extubated per protocol. Placed on 4lpm nasal cannula. Patient had positive cuff leak prior to extubation. NIF -20, VC 1.2L. ?Vent end date: 02/04/22 Vent end time: 1842  ? ?Evaluation ? O2 sats: stable throughout ?Complications: No apparent complications ?Patient did tolerate procedure well. ?Bilateral Breath Sounds: Rhonchi ?  ?Yes ? ?Ander Purpura ?02/04/2022, 6:43 PM ? ?

## 2022-02-04 NOTE — Op Note (Signed)
? ?   ?[address] ?      [name] [address] ?            [phone]      ?                                 ?  ? ?02/04/2022 ?Patient:  Dustin Lawrence ?Pre-Op Dx: 2V CAD ?Ventricular tachycardia ?DM ?HTN ?HLP ?obesity ?   ?Post-op Dx:  same ?Procedure: ?CABG X 2.  LIMA LAD, RSVG OM2   ?Endoscopic greater saphenous vein harvest on the right ? ? ?Surgeon and Role:   ?   * Lajuana Matte, MD - Primary ?   Josie Saunders, PA-C - assisting ?An experienced assistant was required given the complexity of this surgery and the standard of surgical care. The assistant was needed for exposure, dissection, suctioning, retraction of delicate tissues and sutures, instrument exchange and for overall help during this procedure.   ? ?Anesthesia  general ?EBL:  530m ?Blood Administration: none ?Xclamp Time:  41 min ?Pump Time:  878m ? ?Drains: 1921 blake drain: L, mediastinal  ?Wires: A/V ?Counts: correct ? ? ?Indications: ?89[facility] [facility] [place] [facility], and hx of VT.  OR on 3/20 for CABG ?Findings: ?Good LIMA and vein.  Small OM.  Intramyocardial LAD ? ?Operative Technique: ?All invasive lines were placed in pre-op holding.  After the risks, benefits and alternatives were thoroughly discussed, the patient was brought to the operative theatre.  Anesthesia was induced, and the patient was prepped and draped in normal sterile fashion.  An appropriate surgical pause was performed, and pre-operative antibiotics were dosed accordingly. ? ?We began with simultaneous incisions along the right leg for harvesting of the greater saphenous vein and the chest for the sternotomy.  In regards to the sternotomy, this was carried down with bovie cautery, and the sternum was divided with a reciprocating saw.  Meticulous hemostasis was obtained.  The left internal thoracic artery was exposed and harvested in in pedicled fashion.  The patient was systemically heparinized, and the artery was divided  distally, and placed in a papaverine sponge.   ? ?The sternal elevator was removed, and a retractor was placed.  The pericardium was divided in the midline and fashioned into a cradle with pericardial stitches.   After we confirmed an appropriate ACT, the ascending aorta was cannulated in standard fashion.  The right atrial appendage was used for venous cannulation site.  Cardiopulmonary bypass was initiated, and the heart retractor was placed. The cross clamp was applied, and a dose of anterograde cardioplegia was given with good arrest of the heart.  Next we exposed the lateral wall, and found a good target on the OM2.  An end to side anastomosis with the vein graft was then created.  Finally, we exposed a good target on the LAD, and fashioned an end to side anastomosis between it and the LITA.  We began to re-warm, and a re-animation dose of cardioplegia was given.  The heart was de-aired, and the cross clamp was removed.  Meticulous hemostasis was obtained.   ? ?A partial occludding clamp was then placed on the ascending aorta, and we created an end to side anastomosis between it and the proximal vein grafts.  Rings were placed on the proximal anastomosis.  Hemostasis was obtained, and we separated from cardiopulmonary bypass without event.  The heparin was reversed with protamine.  Chest tubes and wires were placed, and the sternum was re-approximated with sternal wires.  The soft tissue and skin were re-approximated wth absorbable suture.   ? ?The patient tolerated the procedure without any immediate complications, and was transferred to the ICU in guarded condition. ? ?Lajuana Matte ? ?

## 2022-02-04 NOTE — Hospital Course (Addendum)
HPI: ?This is a 76 year old male with a past medical history of recently diagnosed sustained VT, hypertension, hyperlipidemia, prior hyperthyroidism/Graves' disease with current hypothyroidism, obesity, who presented to [facility] 02/01/2022 where he was scheduled for a cardiac catheterization and was scheduled for a cardiac MRI (in April). ?  ?Per history, patient was placed on Zio patch in February 2023. A summary of the results showed 19 episodes of SVT, isolated PACs, atrial couplets . Ventricular bigeminy and trigeminy were seen as well. Also of note, TSH recently was low so Levothyroxine was decreased from 200 mcg to 175 mcg daily.  ?Patient noticed less palpitations since change in Levothyroxine. ?He was seen by Dr. Quentin Ore (EP) on 01/30/2022.  Per EP, it was felt his VT was outflow tract in origin. Toprol XL was titrated to 50 mg two times daily. ?  ?Echo done 01/30/2022 showed LVEF 60-65%, mild MR. ?Cardiac catheterization done 02/01/2022 showed severe 2 vessel coronary artery disease, specifically 90% proximal LAD stenosis and 90% mid/distal Circumflex. Patient was transferred to [facility] for further evaluation and treatment. Cardiothoracic consultation was requested with Dr. Kipp Brood for the consideration of coronary artery bypass grafting surgery.  ? ?Dr. Kipp Brood discussed the need for coronary artery bypass grafting surgery. Potential risks, benefits, and complications of the surgery were discussed with the patient and he agreed to proceed with surgery. Pre operative carotid duplex US showed no significant internal carotid artery stenosis bilaterally. ? ?Hospital Course: ?Patient underwent a CABG x 2. He was transferred from the OR to [facility] ICU in stable condition. He was extubated the evening of surgery.  He was weaned off Neo-synephrine as hemodynamics allowed.  He was in NSR and his pacing wires were removed without difficulty.  He also underwent removal of arterial line, Swan  Ganz, a line, chest tubes, and foley. He was started on low dose Lopressor. He had expected post op blood loss anemia. He did not require a transfusion. His last H and H was up to 12.1 and 38.2. He was transitioned off the Insulin drip. His pre op HGA1C is 6. Accu checks and SS PRN will be stopped after transfer to the floor. He was volume overloaded and diuresed accordingly with careful monitoring of creatinine. He required HFNC post op but was then weaned to [facility]. He is felt surgically stable for transfer from the ICU to 4E for further convalescence on 03/22. He continued to maintain sinus rhythm. His creatinine remained 1.52 on 03/23. Of note, his creatinine upon admission was 1.37. He has been tolerating a diet and has had a bowel movement. All wounds are clean, dry, and healing without signs of infection. He was weaned to room air and had good oxygenation. ? ?

## 2022-02-04 NOTE — Brief Op Note (Signed)
02/01/2022 - 02/04/2022 ? ?10:47 AM ? ?PATIENT:  Dustin Lawrence  77 y.o. male ? ?PRE-OPERATIVE DIAGNOSIS:  Coronary artery disease ? ?POST-OPERATIVE DIAGNOSIS:  Coronary artery disease ? ?PROCEDURE:  TRANSESOPHAGEAL ECHOCARDIOGRAM (TEE), CORONARY ARTERY BYPASS GRAFTING (CABG) X , USING LEFT INTERNAL MAMMARY ARTERY AND RIGHT LEG GREATER SAPHENOUS VEIN HARVESTED ENDOSCOPICALLY  ?RIGHT EVH HARVEST TIME: 19 minutes;RIGHT EVH PREP TIME: 10 minutes ? ?SURGEON:  Surgeon(s) and Role: ?   Lightfoot, Lucile Crater, MD - Primary ? ?PHYSICIAN ASSISTANT: Lars Pinks PA-C ? ?ASSISTANTS: Ardyth Man RNFA  ? ?ANESTHESIA:   general ? ?EBL:  Per perfusion, anesthesia record ? ?DRAINS:  Chest tubes placed in the mediastinal and pleural spaces   ? ?COUNTS CORRECT:  YES ? ?DICTATION: .Dragon Dictation ? ?PLAN OF CARE: Admit to inpatient  ? ?PATIENT DISPOSITION:  ICU - intubated and hemodynamically stable. ?  ?Delay start of Pharmacological VTE agent (>24hrs) due to surgical blood loss or risk of bleeding: no ? ?BASELINE WEIGHT: 108.9 kg ?

## 2022-02-04 NOTE — Anesthesia Procedure Notes (Addendum)
Central Venous Catheter Insertion ?Performed by: Darral Dash, DO, anesthesiologist ?Start/End3/20/2023 8:16 AM, 02/04/2022 8:29 AM ?Patient location: Pre-op. ?Preanesthetic checklist: patient identified, IV checked, site marked, risks and benefits discussed, surgical consent, monitors and equipment checked, pre-op evaluation, timeout performed and anesthesia consent ?Position: Trendelenburg ?Lidocaine 1% used for infiltration and patient sedated ?Hand hygiene performed  and maximum sterile barriers used  ?Catheter size: 9 Fr ?Central line was placed.MAC introducer ?Procedure performed using ultrasound guided technique. ?Ultrasound Notes:anatomy identified, needle tip was noted to be adjacent to the nerve/plexus identified, no ultrasound evidence of intravascular and/or intraneural injection and image(s) printed for medical record ?Attempts: 1 ?Following insertion, line sutured, dressing applied and Biopatch. ?Post procedure assessment: blood return through all ports, free fluid flow and no air ? ?Patient tolerated the procedure well with no immediate complications. ?Additional procedure comments: Triple lumen CVC placed through introducer port for additional access. All ports flushed without complication. . ? ? ? ? ? ?

## 2022-02-05 ENCOUNTER — Encounter (HOSPITAL_COMMUNITY): Payer: Self-pay | Admitting: Thoracic Surgery (Cardiothoracic Vascular Surgery)

## 2022-02-05 ENCOUNTER — Inpatient Hospital Stay (HOSPITAL_COMMUNITY): Payer: PPO

## 2022-02-05 DIAGNOSIS — J9601 Acute respiratory failure with hypoxia: Secondary | ICD-10-CM | POA: Diagnosis not present

## 2022-02-05 LAB — GLUCOSE, CAPILLARY
Glucose-Capillary: 100 mg/dL — ABNORMAL HIGH (ref 70–99)
Glucose-Capillary: 114 mg/dL — ABNORMAL HIGH (ref 70–99)
Glucose-Capillary: 125 mg/dL — ABNORMAL HIGH (ref 70–99)
Glucose-Capillary: 127 mg/dL — ABNORMAL HIGH (ref 70–99)
Glucose-Capillary: 127 mg/dL — ABNORMAL HIGH (ref 70–99)
Glucose-Capillary: 128 mg/dL — ABNORMAL HIGH (ref 70–99)
Glucose-Capillary: 130 mg/dL — ABNORMAL HIGH (ref 70–99)
Glucose-Capillary: 135 mg/dL — ABNORMAL HIGH (ref 70–99)
Glucose-Capillary: 136 mg/dL — ABNORMAL HIGH (ref 70–99)
Glucose-Capillary: 147 mg/dL — ABNORMAL HIGH (ref 70–99)
Glucose-Capillary: 99 mg/dL (ref 70–99)

## 2022-02-05 LAB — BASIC METABOLIC PANEL
Anion gap: 6 (ref 5–15)
Anion gap: 7 (ref 5–15)
BUN: 16 mg/dL (ref 8–23)
BUN: 21 mg/dL (ref 8–23)
CO2: 24 mmol/L (ref 22–32)
CO2: 24 mmol/L (ref 22–32)
Calcium: 7.7 mg/dL — ABNORMAL LOW (ref 8.9–10.3)
Calcium: 7.6 mg/dL — ABNORMAL LOW (ref 8.9–10.3)
Chloride: 105 mmol/L (ref 98–111)
Chloride: 103 mmol/L (ref 98–111)
Creatinine, Ser: 1.45 mg/dL — ABNORMAL HIGH (ref 0.61–1.24)
Creatinine, Ser: 1.51 mg/dL — ABNORMAL HIGH (ref 0.61–1.24)
GFR, Estimated: 50 mL/min — ABNORMAL LOW (ref >60)
GFR, Estimated: 48 mL/min — ABNORMAL LOW (ref >60)
Glucose, Bld: 118 mg/dL — ABNORMAL HIGH (ref 70–99)
Glucose, Bld: 120 mg/dL — ABNORMAL HIGH (ref 70–99)
Potassium: 4.9 mmol/L (ref 3.5–5.1)
Potassium: 4.8 mmol/L (ref 3.5–5.1)
Sodium: 135 mmol/L (ref 135–145)
Sodium: 134 mmol/L — ABNORMAL LOW (ref 135–145)

## 2022-02-05 LAB — CBC
HCT: 40.9 % (ref 39.0–52.0)
HCT: 40.1 % (ref 39.0–52.0)
Hemoglobin: 12.8 g/dL — ABNORMAL LOW (ref 13.0–17.0)
Hemoglobin: 12.1 g/dL — ABNORMAL LOW (ref 13.0–17.0)
MCH: 27 pg (ref 26.0–34.0)
MCH: 26.7 pg (ref 26.0–34.0)
MCHC: 31.3 g/dL (ref 30.0–36.0)
MCHC: 30.2 g/dL (ref 30.0–36.0)
MCV: 86.3 fL (ref 80.0–100.0)
MCV: 88.3 fL (ref 80.0–100.0)
Platelets: 210 10*3/uL (ref 150–400)
Platelets: 180 10*3/uL (ref 150–400)
RBC: 4.74 MIL/uL (ref 4.22–5.81)
RBC: 4.54 MIL/uL (ref 4.22–5.81)
RDW: 18 % — ABNORMAL HIGH (ref 11.5–15.5)
RDW: 18.1 % — ABNORMAL HIGH (ref 11.5–15.5)
WBC: 20.7 10*3/uL — ABNORMAL HIGH (ref 4.0–10.5)
WBC: 17.4 10*3/uL — ABNORMAL HIGH (ref 4.0–10.5)
nRBC: 0 % (ref 0.0–0.2)
nRBC: 0 % (ref 0.0–0.2)

## 2022-02-05 LAB — BPAM RBC
Blood Product Expiration Date: 202304102359
Blood Product Expiration Date: 202304102359
Unit Type and Rh: 5100
Unit Type and Rh: 5100

## 2022-02-05 LAB — TYPE AND SCREEN
ABO/RH(D): O POS
Antibody Screen: NEGATIVE
Unit division: 0
Unit division: 0

## 2022-02-05 LAB — MAGNESIUM
Magnesium: 2.6 mg/dL — ABNORMAL HIGH (ref 1.7–2.4)
Magnesium: 2.5 mg/dL — ABNORMAL HIGH (ref 1.7–2.4)

## 2022-02-05 MED ORDER — ENOXAPARIN SODIUM 30 MG/0.3ML IJ SOSY
30.0000 mg | PREFILLED_SYRINGE | Freq: Every day | INTRAMUSCULAR | Status: DC
  Administered 2022-02-05 – 2022-02-08 (×4): 30 mg via SUBCUTANEOUS
  Filled 2022-02-05 (×4): qty 0.3

## 2022-02-05 MED ORDER — LIDOCAINE 5 % EX PTCH
2.0000 | MEDICATED_PATCH | CUTANEOUS | Status: DC
  Administered 2022-02-05 – 2022-02-06 (×2): 2 via TRANSDERMAL
  Filled 2022-02-05 (×4): qty 2

## 2022-02-05 MED ORDER — INSULIN ASPART 100 UNIT/ML IJ SOLN
0.0000 [IU] | INTRAMUSCULAR | Status: DC
  Administered 2022-02-05 (×2): 2 [IU] via SUBCUTANEOUS

## 2022-02-05 MED ORDER — INSULIN ASPART 100 UNIT/ML IJ SOLN: 0.0000 [IU] | INTRAMUSCULAR | Status: DC

## 2022-02-05 MED ORDER — GABAPENTIN 100 MG PO CAPS
200.0000 mg | ORAL_CAPSULE | Freq: Two times a day (BID) | ORAL | Status: DC
  Administered 2022-02-05 – 2022-02-09 (×9): 200 mg via ORAL
  Filled 2022-02-05 (×9): qty 2

## 2022-02-05 NOTE — Progress Notes (Signed)
? ?NAME:  Dustin Lawrence, MRN:  [id number], DOB:  June 22, 1946, LOS: 4 ?ADMISSION DATE:  02/01/2022, CONSULTATION DATE:  02/04/2022 ?REFERRING MD:  Kipp Brood - TCTS, CHIEF COMPLAINT:  post-CABG  ? ?History of Present Illness:  ?76 year old who underwent CABG x 2 for CAD discovered as part of a VT evaluation.  ? ?Patient apparently developed palpitations which were originally felt to be arising from the LVOT.  ? ?Cardiac catheterization as part of this evaluation showed severe 2 vessel coronary disease, with an EF of 60%  ? ?Pertinent  Medical History  ? ?Past Medical History:  ?Diagnosis Date  ? Cancer [facility])   ? skin  ? Dyslipidemia   ? History of chicken pox   ? History of Graves' disease 1981-1986  ? History of migraines   ? Hypertension   ? Hypothyroidism   ? Dr. [name] Endo, goal TSH 1.0  ? Obesity   ? Seasonal allergies   ? Sustained ventricular tachycardia   ? ? ?Significant Hospital Events: ?Including procedures, antibiotic start and stop dates in addition to other pertinent events   ?3/20 CABG X 2.  LIMA LAD, RSVG OM2, Endoscopic greater saphenous vein harvest on the right  ?3/21 Tolerated extubation yesterday afternoon, some issues with pain this am  ? ?Interim History / Subjective:  ?Reports he is uncomfortable and asks for his glasses, I could not locate them  ? ?Objective   ?Blood pressure (!) 118/55, pulse 63, temperature 98.5 ?F (36.9 ?C), temperature source Axillary, resp. rate (!) 34, height '5\' 9"'$  (1.753 m), weight 113.2 kg, SpO2 93 %. ?CVP:  [0 mmHg-23 mmHg] 18 mmHg  ?Vent Mode: PSV;CPAP ?FiO2 (%):  [40 %-50 %] 50 % ?Set Rate:  [4 bmp-12 bmp] 4 bmp ?Vt Set:  [560 mL] 560 mL ?PEEP:  [5 cmH20] 5 cmH20 ?Pressure Support:  [10 cmH20] 10 cmH20  ? ?Intake/Output Summary (Last 24 hours) at 02/05/2022 0823 ?Last data filed at 02/05/2022 4132 ?Gross per 24 hour  ?Intake 5728.86 ml  ?Output 4295 ml  ?Net 1433.86 ml  ? ? ?Filed Weights  ? 02/04/22 0752 02/05/22 0649  ?Weight: 108.9 kg 113.2 kg   ? ?Examination: ?General: Acute on chronically ill appearing elderly male lying in bed, in NAD ?HEENT: [place]/AT, MM pink/moist, PERRL,  ?Neuro: Alert and oriented x3, non-focal  ?CV: s1s2 regular rate and rhythm, no murmur, rubs, or gallops,  ?PULM:  Clear to ascultation, shallow respirations due to pulmonary splinting  ?GI: soft, bowel sounds active in all 4 quadrants, non-tender, non-distended, tolerating oral diet  ?Extremities: warm/dry, no edema  ?Skin: no rashes or lesions ? ?Ancillary tests personally reviewed:   ?CXR lines in place.  ? ?Assessment & Plan:  ?Critically ill due postoperative expected respiratory failure require mechanical ventilation ?-Tolerated extubation evening of 3/20 ?P: ?Head of bed elevated 30 degrees. ?Follow intermittent chest x-ray and ABG.   ?Ensure adequate pulmonary hygiene  ?Mobilize as able  ?Continue supplemental oxygen for sat goal> 90 ?Ensure adequate pain control to prevent pulmonary splinting  ? ?Postop vasoplegia from pump run requiring titration of vasopressors ?Coronary artery disease now s/p CABG x2 ?Hx of dyslipidemia  ?Prior sustained ventricular tachycardia ?P: ?Primary management per CTS  ?Continue to wean pressors as able  ?MAP goal > 65 ?Continuous telemetry  ?Continue ASA and statin ?Daily weight   ? ?Hypothyroidism ?P: ?Continue home synthroid  ? ? ?Best Practice (right click and "Reselect all SmartList Selections" daily)  ? ?Diet/type: NPO ?DVT prophylaxis: not  indicated ?GI prophylaxis: H2B ?Lines: Central line and Arterial Line ?Foley:  Yes, and it is still needed ?Code Status:  full code ?Last date of multidisciplinary goals of care discussion [Per primary team.] ? ?Critical care: 37 min  ?Kalise Fickett D. Harris, NP-C ?[facility] ?Personal contact information can be found on Amion  ?02/05/2022, 8:30 AM ? ? ?  ?

## 2022-02-05 NOTE — Progress Notes (Signed)
? ?   ?[name] ?      [name] [address] ?            [phone]   ?              ?1 Day Post-Op ?Procedure(s) (LRB): ?CORONARY ARTERY BYPASS GRAFTING (CABG) X TWO, USING LEFT INTERNAL MAMMARY ARTERY AND RIGHT LEG GREATER SAPHENOUS VEIN HARVESTED ENDOSCOPICALLY (N/A) ?TRANSESOPHAGEAL ECHOCARDIOGRAM (TEE) (N/A) ? ? ?Events: ?No events ?_______________________________________________________________ ?Vitals: ?BP (!) 109/52   Pulse 67   Temp 98.8 ?F (37.1 ?C)   Resp (!) 25   Ht '5\' 9"'$  (1.753 m)   Wt 113.2 kg   SpO2 91%   BMI 36.85 kg/m?  ?Filed Weights  ? 02/04/22 0752 02/05/22 0649  ?Weight: 108.9 kg 113.2 kg  ? ? ? ?- Neuro: alert NAD ? ?- Cardiovascular: sinus ? Drips: neo 10.   ?CVP:  [0 mmHg-23 mmHg] 16 mmHg ? ?- Pulm: shallow breaths ?Vent Mode: PSV;CPAP ?FiO2 (%):  [40 %-50 %] 50 % ?Set Rate:  [4 bmp-12 bmp] 4 bmp ?Vt Set:  [560 mL] 560 mL ?PEEP:  [5 cmH20] 5 cmH20 ?Pressure Support:  [10 cmH20] 10 cmH20 ? ?ABG ?   ?Component Value Date/Time  ? PHART 7.249 (L) 02/04/2022 1959  ? PCO2ART 48.3 (H) 02/04/2022 1959  ? PO2ART 98 02/04/2022 1959  ? HCO3 21.2 02/04/2022 1959  ? TCO2 23 02/04/2022 1959  ? ACIDBASEDEF 6.0 (H) 02/04/2022 1959  ? O2SAT 96 02/04/2022 1959  ? ? ?- Abd: ND ?- Extremity: warm ? ?Marland KitchenIntake/Output   ?   03/20 0701 ?03/21 0700 03/21 0701 ?03/22 0700  ? P.O.    ? I.V. (mL/kg) 2814.9 (24.9)   ? Blood 861   ? IV Piggyback 2053   ? Total Intake(mL/kg) 5728.9 (50.6)   ? Urine (mL/kg/hr) 2835 (1)   ? Blood 990   ? Chest Tube 470   ? Total Output 4295   ? Net +1433.9   ?     ?  ? ? ?_______________________________________________________________ ?Labs: ?CBC Latest Ref Rng & Units 02/05/2022 02/04/2022 02/04/2022  ?WBC 4.0 - 10.5 K/uL 20.7(H) 25.2(H) -  ?Hemoglobin 13.0 - 17.0 g/dL 12.8(L) 13.7 14.3  ?Hematocrit 39.0 - 52.0 % 40.9 43.6 42.0  ?Platelets 150 - 400 K/uL 210 207 -  ? ?CMP Latest Ref Rng & Units 02/05/2022 02/04/2022 02/04/2022  ?Glucose 70 - 99 mg/dL 118(H) 127(H) -   ?BUN 8 - 23 mg/dL 16 16 -  ?Creatinine 0.61 - 1.24 mg/dL 1.45(H) 1.29(H) -  ?Sodium 135 - 145 mmol/L 135 137 140  ?Potassium 3.5 - 5.1 mmol/L 4.9 5.0 4.6  ?Chloride 98 - 111 mmol/L 105 109 -  ?CO2 22 - 32 mmol/L 24 22 -  ?Calcium 8.9 - 10.3 mg/dL 7.7(L) 7.5(L) -  ?Total Protein 6.5 - 8.1 g/dL - - -  ?Total Bilirubin 0.3 - 1.2 mg/dL - - -  ?Alkaline Phos 38 - 126 U/L - - -  ?AST 15 - 41 U/L - - -  ?ALT 17 - 63 U/L - - -  ? ? ?CXR: ?PV congestion ? ?_______________________________________________________________ ? ?Assessment and Plan: ?POD 1 s/p CABG ? ?Neuro: pain controlled ?CV: wean neo.  Will remove a line.  On A/S/BB.  Will remove wires ?Pulm: pulm hyg ?Renal: creat up.  Mad good urine.  Will watch, encourage PO intake ?GI: on diet ?Heme: stable ?ID: leukocytosis.  Will watch ?Endo: SSI ?Dispo: continue ICU care ? ? ?  Dustin Lawrence Dustin Lawrence ?02/05/2022 7:01 AM ? ? ?

## 2022-02-05 NOTE — Progress Notes (Signed)
TCTS PM rounds ? ?Patient had stable day postop day #1 ?Maintaining sinus rhythm ?Out of bed to chair, chest tubes out ?PM labs reviewed and are satisfactory ?Breathing comfortably on nasal cannula with saturation 95% ? ?Blood pressure (!) 116/49, pulse (!) 54, temperature (!) 97.4 ?F (36.3 ?C), temperature source Axillary, resp. rate 15, height '5\' 9"'$  (1.753 m), weight 113.2 kg, SpO2 95 %.  ?

## 2022-02-05 NOTE — Plan of Care (Signed)
?  Problem: Education: ?Goal: Knowledge of disease or condition will improve ?Outcome: Progressing ?Goal: Knowledge of the prescribed therapeutic regimen will improve ?Outcome: Progressing ?  ?Problem: Activity: ?Goal: Risk for activity intolerance will decrease ?Outcome: Not Progressing ?  ?Problem: Clinical Measurements: ?Goal: Postoperative complications will be avoided or minimized ?Outcome: Progressing ?  ?Problem: Respiratory: ?Goal: Respiratory status will improve ?Outcome: Progressing ?  ?

## 2022-02-06 ENCOUNTER — Institutional Professional Consult (permissible substitution): Payer: PPO | Admitting: Cardiology

## 2022-02-06 ENCOUNTER — Inpatient Hospital Stay (HOSPITAL_COMMUNITY): Payer: PPO

## 2022-02-06 DIAGNOSIS — I472 Ventricular tachycardia, unspecified: Secondary | ICD-10-CM | POA: Diagnosis not present

## 2022-02-06 LAB — BASIC METABOLIC PANEL
Anion gap: 6 (ref 5–15)
Anion gap: 7 (ref 5–15)
BUN: 22 mg/dL (ref 8–23)
BUN: 22 mg/dL (ref 8–23)
CO2: 25 mmol/L (ref 22–32)
CO2: 25 mmol/L (ref 22–32)
Calcium: 7.8 mg/dL — ABNORMAL LOW (ref 8.9–10.3)
Calcium: 8 mg/dL — ABNORMAL LOW (ref 8.9–10.3)
Chloride: 102 mmol/L (ref 98–111)
Chloride: 102 mmol/L (ref 98–111)
Creatinine, Ser: 1.53 mg/dL — ABNORMAL HIGH (ref 0.61–1.24)
Creatinine, Ser: 1.52 mg/dL — ABNORMAL HIGH (ref 0.61–1.24)
GFR, Estimated: 47 mL/min — ABNORMAL LOW (ref >60)
GFR, Estimated: 47 mL/min — ABNORMAL LOW (ref >60)
Glucose, Bld: 103 mg/dL — ABNORMAL HIGH (ref 70–99)
Glucose, Bld: 155 mg/dL — ABNORMAL HIGH (ref 70–99)
Potassium: 4.9 mmol/L (ref 3.5–5.1)
Potassium: 4.5 mmol/L (ref 3.5–5.1)
Sodium: 133 mmol/L — ABNORMAL LOW (ref 135–145)
Sodium: 134 mmol/L — ABNORMAL LOW (ref 135–145)

## 2022-02-06 LAB — GLUCOSE, CAPILLARY
Glucose-Capillary: 105 mg/dL — ABNORMAL HIGH (ref 70–99)
Glucose-Capillary: 91 mg/dL (ref 70–99)
Glucose-Capillary: 86 mg/dL (ref 70–99)
Glucose-Capillary: 103 mg/dL — ABNORMAL HIGH (ref 70–99)
Glucose-Capillary: 112 mg/dL — ABNORMAL HIGH (ref 70–99)

## 2022-02-06 LAB — CBC
HCT: 37.8 % — ABNORMAL LOW (ref 39.0–52.0)
Hemoglobin: 11.4 g/dL — ABNORMAL LOW (ref 13.0–17.0)
MCH: 26.8 pg (ref 26.0–34.0)
MCHC: 30.2 g/dL (ref 30.0–36.0)
MCV: 88.7 fL (ref 80.0–100.0)
Platelets: 160 10*3/uL (ref 150–400)
RBC: 4.26 MIL/uL (ref 4.22–5.81)
RDW: 18.2 % — ABNORMAL HIGH (ref 11.5–15.5)
WBC: 14.8 10*3/uL — ABNORMAL HIGH (ref 4.0–10.5)
nRBC: 0 % (ref 0.0–0.2)

## 2022-02-06 MED ORDER — SODIUM CHLORIDE 0.9 % IV SOLN: 250.0000 mL | INTRAVENOUS | Status: DC | PRN

## 2022-02-06 MED ORDER — SODIUM CHLORIDE 0.9% FLUSH
3.0000 mL | Freq: Two times a day (BID) | INTRAVENOUS | Status: DC
  Administered 2022-02-06 (×2): 3 mL via INTRAVENOUS

## 2022-02-06 MED ORDER — FUROSEMIDE 10 MG/ML IJ SOLN
40.0000 mg | Freq: Once | INTRAMUSCULAR | Status: AC
  Administered 2022-02-06: 40 mg via INTRAVENOUS
  Filled 2022-02-06: qty 4

## 2022-02-06 MED ORDER — SODIUM CHLORIDE 0.9% FLUSH: 3.0000 mL | INTRAVENOUS | Status: DC | PRN

## 2022-02-06 MED ORDER — ~~LOC~~ CARDIAC SURGERY, PATIENT & FAMILY EDUCATION: Freq: Once | Status: AC

## 2022-02-06 MED FILL — Lidocaine HCl Local Preservative Free (PF) Inj 2%: INTRAMUSCULAR | Qty: 15 | Status: AC

## 2022-02-06 MED FILL — Heparin Sodium (Porcine) Inj 1000 Unit/ML: Qty: 1000 | Status: AC

## 2022-02-06 MED FILL — Potassium Chloride Inj 2 mEq/ML: INTRAVENOUS | Qty: 40 | Status: AC

## 2022-02-06 NOTE — Progress Notes (Signed)
? ?  NAME:  Dustin Lawrence, MRN:  [id number], DOB:  July 06, 1946, LOS: 5 ?ADMISSION DATE:  02/01/2022, CONSULTATION DATE:  02/04/2022 ?REFERRING MD:  Kipp Brood - TCTS, CHIEF COMPLAINT:  post-CABG  ? ?History of Present Illness:  ?76 year old who underwent CABG x 2 for CAD discovered as part of a VT evaluation.  ? ?Patient apparently developed palpitations which were originally felt to be arising from the LVOT.  ? ?Cardiac catheterization as part of this evaluation showed severe 2 vessel coronary disease, with an EF of 60%  ? ?Pertinent  Medical History  ? ?Past Medical History:  ?Diagnosis Date  ? Cancer [facility])   ? skin  ? Dyslipidemia   ? History of chicken pox   ? History of Graves' disease 1981-1986  ? History of migraines   ? Hypertension   ? Hypothyroidism   ? Dr. [name] Endo, goal TSH 1.0  ? Obesity   ? Seasonal allergies   ? Sustained ventricular tachycardia   ? ? ?Significant Hospital Events: ?Including procedures, antibiotic start and stop dates in addition to other pertinent events   ?3/20 CABG X 2.  LIMA LAD, RSVG OM2, Endoscopic greater saphenous vein harvest on the right  ?3/21 Tolerated extubation yesterday afternoon, some issues with pain this am  ? ?Interim History / Subjective:  ?Looks great. No complaints.  ? ?Objective   ?Blood pressure 131/63, pulse 84, temperature 98 ?F (36.7 ?C), temperature source Axillary, resp. rate (!) 22, height '5\' 9"'$  (1.753 m), weight 114 kg, SpO2 94 %. ?   ?   ? ?Intake/Output Summary (Last 24 hours) at 02/06/2022 0929 ?Last data filed at 02/06/2022 0700 ?Gross per 24 hour  ?Intake 1118.68 ml  ?Output 515 ml  ?Net 603.68 ml  ? ? ?Filed Weights  ? 02/04/22 0752 02/05/22 0649 02/06/22 0500  ?Weight: 108.9 kg 113.2 kg 114 kg  ? ?Examination: ?General: [name] ?HEENT: [place]/AT, PERRL, no JVD ?Neuro: Alert, oriented, non-focal  ?CV: RRR, no MRG ?PULM:  Clear. Sats in mid 90s on 6L.  ?GI: soft, non-tender.  ?Extremities: no edema.  ?Skin: grossly  intact ? ?Resolved problems   ?Postoperative expected respiratory failure require mechanical ventilation ?Postop vasoplegia from pump run requiring titration of vasopressors ? ?Assessment & Plan:  ? ?Hypoxia: post op splinting/ATX plus probably a component of pulmonary edema. Small pleural effusion.  ?-Tolerated extubation evening of 3/20 ?P: ?Supp oxygen 6L, wean as tolerated for sat > 90% ?Incentive spirometry instruction given ?Mobilize, will consult PT ?May benefit from diuresis ?Multimodal pain control with gabapentin, lidocaine patch, and narcotics if needed.  ? ?Coronary artery disease now s/p CABG x2 ?Hx of dyslipidemia  ?Prior sustained ventricular tachycardia ?P: ?Primary management per CTS  ?Continue ASA and statin ?Daily weight   ? ?Hypothyroidism ?P: ?Continue home synthroid  ? ?To floor today ?Remove CVL ? ? ?Best Practice (right click and "Reselect all SmartList Selections" daily)  ? ?Diet/type: Regular consistency (see orders) ?DVT prophylaxis: LMWH ?GI prophylaxis: H2B ?Lines: Central line and Arterial Line ?Foley:  Yes, and it is still needed ?Code Status:  full code ?Last date of multidisciplinary goals of care discussion [Per primary team.] ? ?Critical care: NA  ? ?Georgann Housekeeper, AGACNP-BC ?[facility] ? ?See Amion for personal pager ?PCCM on call pager [phone] until 7pm. ?Please call Elink 7p-7a. [phone] ? ?02/06/2022 9:37 AM ? ? ? ? ? ? ?  ?

## 2022-02-06 NOTE — Progress Notes (Addendum)
TCTS DAILY ICU PROGRESS NOTE ? ?                 [address] ?           [name] [address] ?         [phone]  ? ?2 Days Post-Op ?Procedure(s) (LRB): ?CORONARY ARTERY BYPASS GRAFTING (CABG) X TWO, USING LEFT INTERNAL MAMMARY ARTERY AND RIGHT LEG GREATER SAPHENOUS VEIN HARVESTED ENDOSCOPICALLY (N/A) ?TRANSESOPHAGEAL ECHOCARDIOGRAM (TEE) (N/A) ? ?Total Length of Stay:  LOS: 5 days  ? ?Subjective: ?Patient has eaten a fair amount this am. He denies nausea. He state she is not having much pain. ? ?Objective: ?Vital signs in last 24 hours: ?Temp:  [97.4 ?F (36.3 ?C)-98.5 ?F (36.9 ?C)] 98 ?F (36.7 ?C) (03/22 0400) ?Pulse Rate:  [51-85] 85 (03/22 0600) ?Cardiac Rhythm: Normal sinus rhythm (03/22 0400) ?Resp:  [12-25] 21 (03/22 0600) ?BP: (76-132)/(45-91) 132/57 (03/22 0600) ?SpO2:  [90 %-100 %] 97 % (03/22 0600) ?Weight:  [347 kg] 114 kg (03/22 0500) ? ?Filed Weights  ? 02/04/22 0752 02/05/22 0649 02/06/22 0500  ?Weight: 108.9 kg 113.2 kg 114 kg  ? ? ?Weight change: 5.137 kg  ?  ? ?Intake/Output from previous day: ?03/21 0701 - 03/22 0700 ?In: 1118.7 [P.O.:360; I.V.:727.6; IV Piggyback:31.1] ?Out: 515 [Urine:455; Chest Tube:60] ? ?Intake/Output this shift: ?No intake/output data recorded. ? ?Current Meds: ?Scheduled Meds: ? acetaminophen  1,000 mg Oral Q6H  ? Or  ? acetaminophen (TYLENOL) oral liquid 160 mg/5 mL  1,000 mg Per Tube Q6H  ? aspirin EC  325 mg Oral Daily  ? Or  ? aspirin  324 mg Per Tube Daily  ? atorvastatin  80 mg Oral Daily  ? bisacodyl  10 mg Oral Daily  ? Or  ? bisacodyl  10 mg Rectal Daily  ? Chlorhexidine Gluconate Cloth  6 each Topical Daily  ? cholecalciferol  1,000 Units Oral Daily  ? docusate sodium  200 mg Oral Daily  ? enoxaparin (LOVENOX) injection  30 mg Subcutaneous QHS  ? gabapentin  200 mg Oral BID  ? insulin aspart  0-24 Units Subcutaneous Q4H  ? levothyroxine  175 mcg Oral Q0600  ? lidocaine  2 patch Transdermal Q24H  ? metoprolol tartrate  12.5 mg Oral BID  ? Or  ?  metoprolol tartrate  12.5 mg Per Tube BID  ? multivitamin  1 tablet Oral Daily  ? pantoprazole  40 mg Oral Daily  ? sodium chloride flush  10-40 mL Intracatheter Q12H  ? sodium chloride flush  3 mL Intravenous Q12H  ? ?Continuous Infusions: ? sodium chloride 10 mL/hr at 02/06/22 0700  ? sodium chloride    ? sodium chloride Stopped (02/04/22 1520)  ? dexmedetomidine (PRECEDEX) IV infusion Stopped (02/04/22 1649)  ? insulin Stopped (02/05/22 4259)  ? lactated ringers    ? lactated ringers Stopped (02/04/22 1520)  ? lactated ringers 20 mL/hr at 02/06/22 0700  ? niCARDipine Stopped (02/04/22 1519)  ? nitroGLYCERIN Stopped (02/04/22 1542)  ? phenylephrine (NEO-SYNEPHRINE) Adult infusion Stopped (02/05/22 5638)  ? ?PRN Meds:.sodium chloride, dextrose, lactated ringers, metoprolol tartrate, midazolam, morphine injection, ondansetron (ZOFRAN) IV, oxyCODONE, sodium chloride flush, sodium chloride flush, traMADol ? ?General appearance: alert, cooperative, and no distress ?Neurologic: intact ?Heart: RRR ?Lungs: Slightly diminished bibasilar breath sounds L>R ?Abdomen: Soft, obese, non tender, bowel sounds present ?Extremities: Bilateral LE edema ?Wound: Aquacel intact. RLE dressing was partially removed;wound is clean and dry. ? ?Lab Results: ?CBC: ?Recent Labs  ?  02/05/22 ?1640 02/06/22 ?0413  ?WBC 17.4* 14.8*  ?HGB 12.1* 11.4*  ?HCT 40.1 37.8*  ?PLT 180 160  ? ?BMET:  ?Recent Labs  ?  02/05/22 ?1640 02/06/22 ?0413  ?NA 134* 133*  ?K 4.8 4.9  ?CL 103 102  ?CO2 24 25  ?GLUCOSE 120* 103*  ?BUN 21 22  ?CREATININE 1.51* 1.53*  ?CALCIUM 7.6* 7.8*  ?  ?CMET: ?Lab Results  ?Component Value Date  ? WBC 14.8 (H) 02/06/2022  ? HGB 11.4 (L) 02/06/2022  ? HCT 37.8 (L) 02/06/2022  ? PLT 160 02/06/2022  ? GLUCOSE 103 (H) 02/06/2022  ? CHOL 237 (H) 08/18/2012  ? TRIG 301.0 (H) 08/18/2012  ? HDL 35.50 (L) 08/18/2012  ? LDLDIRECT 155.9 08/18/2012  ? ALT 19 04/03/2018  ? AST 25 04/03/2018  ? NA 133 (L) 02/06/2022  ? K 4.9 02/06/2022  ? CL 102  02/06/2022  ? CREATININE 1.53 (H) 02/06/2022  ? BUN 22 02/06/2022  ? CO2 25 02/06/2022  ? TSH 0.295 (L) 04/03/2018  ? INR 1.3 (H) 02/04/2022  ? HGBA1C 6.0 (H) 02/02/2022  ? ? ? ?PT/INR:  ?Recent Labs  ?  02/04/22 ?1413  ?LABPROT 16.0*  ?INR 1.3*  ? ?Radiology: No results found. ? ? ?Assessment/Plan: ?S/P Procedure(s) (LRB): ?CORONARY ARTERY BYPASS GRAFTING (CABG) X TWO, USING LEFT INTERNAL MAMMARY ARTERY AND RIGHT LEG GREATER SAPHENOUS VEIN HARVESTED ENDOSCOPICALLY (N/A) ?TRANSESOPHAGEAL ECHOCARDIOGRAM (TEE) (N/A) ?CV-History of SVT. First degree heart block, SR. On Lopressor 12.5 mg bid. ?Pulmonary-on 6 liters of oxygen via HFNC. Wean as able over next few days. CXR this am appears to show bibasilar atelectasis/small left pleural effusion. Encourage incentive spirometer. ?Volume overload-needs diuresis. Will discuss with Dr. Kipp Brood. ?Expected post op blood loss anemia-H and H this am slightly decreased to 11.4 and 37.8 ?5. CBGs 100/114/105. Pre op HGA1C 6. He will need further surveillance with medical doctor after discharge. Will provide nutritional information with discharge paperwork. ?6. Creatinine this am 1.53. Creatinine upon admission was 1.37 ?7. On Lovenox for DVT prophylaxis ?8. Likely transfer to 4E, if bed available ? ?Donielle Liston Alba PA-C ?02/06/2022 7:11 AM  ? ?Agree with above ?Creat plateau, gentle diuresis ?Will transfer to floor ? ?Lajuana Matte ? ?

## 2022-02-06 NOTE — Evaluation (Signed)
Physical Therapy Evaluation ?Patient Details ?Name: Dustin Lawrence ?MRN: [id number] ?DOB: [birth year] ?Today's Date: 02/06/2022 ? ?History of Present Illness ? 76 y.o. male [facility] hospital on 02/[facility]/2022 as a transfer from [facility] after cardiac cath revealed  90% proximal LAD, mid/distal LCx, and distal LCx stenoses involving a left-dominant system. Pt underwent CABG x 2 on 3/20. PMH includes grave's disease, HTN, SVT.  ?Clinical Impression ? Pt presents to PT with deficits in activity tolerance, strength, power, cardiopulmonary function, gait, balance. Pt currently lacks LE power and core strength to stand with limited use of upper extremities. Pt also appears to fatigue fairly quickly when mobilizing, with noted increase in work of breathing. Pt will benefit from continued aggressive mobilization in an effort to restore independence.    ?   ? ?Recommendations for follow up therapy are one component of a multi-disciplinary discharge planning process, led by the attending physician.  Recommendations may be updated based on patient status, additional functional criteria and insurance authorization. ? ?Follow Up Recommendations Home health PT ? ?  ?Assistance Recommended at Discharge Intermittent Supervision/Assistance  ?Patient can return home with the following ? A little help with walking and/or transfers;A little help with bathing/dressing/bathroom;Assistance with cooking/housework;Help with stairs or ramp for entrance;Assist for transportation ? ?  ?Equipment Recommendations Rolling walker (2 wheels)  ?Recommendations for Other Services ?    ?  ?Functional Status Assessment Patient has had a recent decline in their functional status and demonstrates the ability to make significant improvements in function in a reasonable and predictable amount of time.  ? ?  ?Precautions / Restrictions Precautions ?Precautions: Fall;Sternal ?Precaution Booklet Issued: No ?Precaution Comments: verbally reviewed sternal  precautions, pt with good recall ?Restrictions ?Weight Bearing Restrictions: No ?RUE Weight Bearing: Non weight bearing ?Other Position/Activity Restrictions: sternal precautions  ? ?  ? ?Mobility ? Bed Mobility ?Overal bed mobility: Needs Assistance ?Bed Mobility: Supine to Sit ?  ?  ?Supine to sit: Min assist, HOB elevated ?  ?  ?  ?  ? ?Transfers ?Overall transfer level: Needs assistance ?Equipment used: Rolling walker (2 wheels) ?Transfers: Sit to/from Stand ?Sit to Stand: Min assist ?  ?  ?  ?  ?  ?General transfer comment: PT encourages increased forward trunk lean to aid in transfer efficiency and reudce use of UEs ?  ? ?Ambulation/Gait ?Ambulation/Gait assistance: Supervision ?Gait Distance (Feet): 150 Feet ?Assistive device: Rolling walker (2 wheels) ?Gait Pattern/deviations: Step-through pattern ?Gait velocity: reduced ?Gait velocity interpretation: 1.31 - 2.62 ft/sec, indicative of limited community ambulator ?  ?General Gait Details: pt with slowed step-through gait, reduced stride length ? ?Stairs ?  ?  ?  ?  ?  ? ?Wheelchair Mobility ?  ? ?Modified Rankin (Stroke Patients Only) ?  ? ?  ? ?Balance Overall balance assessment: Needs assistance ?Sitting-balance support: No upper extremity supported, Feet supported ?Sitting balance-Leahy Scale: Good ?  ?  ?Standing balance support: Single extremity supported, Bilateral upper extremity supported, Reliant on assistive device for balance ?Standing balance-Leahy Scale: Poor ?  ?  ?  ?  ?  ?  ?  ?  ?  ?  ?  ?  ?   ? ? ? ?Pertinent Vitals/Pain Pain Assessment ?Pain Assessment: No/denies pain  ? ? ?Home Living Family/patient expects to be discharged to:: Private residence ?Living Arrangements: Spouse/significant other ?Available Help at Discharge: Family;Available 24 hours/day ?Type of Home: House ?Home Access: Stairs to enter ?Entrance Stairs-Rails: None ?Entrance Stairs-Number of Steps: 3 ?  ?  Home Layout: One level ?Home Equipment: None ?   ?  ?Prior Function  Prior Level of Function : Independent/Modified Independent ?  ?  ?  ?  ?  ?  ?  ?  ?  ? ? ?Hand Dominance  ?   ? ?  ?Extremity/Trunk Assessment  ? Upper Extremity Assessment ?Upper Extremity Assessment: Generalized weakness ?  ? ?Lower Extremity Assessment ?Lower Extremity Assessment: Generalized weakness ?  ? ?Cervical / Trunk Assessment ?Cervical / Trunk Assessment: Normal  ?Communication  ? Communication: No difficulties  ?Cognition Arousal/Alertness: Awake/alert ?Behavior During Therapy: [facility] for tasks assessed/performed ?Overall Cognitive Status: Within Functional Limits for tasks assessed ?  ?  ?  ?  ?  ?  ?  ?  ?  ?  ?  ?  ?  ?  ?  ?  ?  ?  ?  ? ?  ?General Comments General comments (skin integrity, edema, etc.): pt on 2L [place] at rest, PT increases supplemental oxygen to 4L during ambulation due to increased work of breathing, unable to get a reliable pleth reading at this time. Pt sats well on 2L [place] at end of session when resting ? ?  ?Exercises    ? ?Assessment/Plan  ?  ?PT Assessment Patient needs continued PT services  ?PT Problem List Decreased strength;Decreased activity tolerance;Decreased balance;Decreased mobility;Cardiopulmonary status limiting activity;Decreased knowledge of precautions ? ?   ?  ?PT Treatment Interventions DME instruction;Gait training;Stair training;Functional mobility training;Therapeutic activities;Therapeutic exercise;Balance training;Neuromuscular re-education;Patient/family education   ? ?PT Goals (Current goals can be found in the Care Plan section)  ?Acute Rehab PT Goals ?Patient Stated Goal: to return to independent mobility ?PT Goal Formulation: With patient ?Time For Goal Achievement: 02/20/22 ?Potential to Achieve Goals: Good ? ?  ?Frequency Min 3X/week ?  ? ? ?Co-evaluation   ?  ?  ?  ?  ? ? ?  ?AM-PAC PT "6 Clicks" Mobility  ?Outcome Measure Help needed turning from your back to your side while in a flat bed without using bedrails?: A Little ?Help needed moving from  lying on your back to sitting on the side of a flat bed without using bedrails?: A Little ?Help needed moving to and from a bed to a chair (including a wheelchair)?: A Little ?Help needed standing up from a chair using your arms (e.g., wheelchair or bedside chair)?: A Little ?Help needed to walk in hospital room?: A Little ?Help needed climbing 3-5 steps with a railing? : A Lot ?6 Click Score: 17 ? ?  ?End of Session Equipment Utilized During Treatment: Oxygen ?Activity Tolerance: Patient tolerated treatment well ?Patient left: in chair;with call bell/phone within reach ?Nurse Communication: Mobility status ?PT Visit Diagnosis: Other abnormalities of gait and mobility (R26.89);Muscle weakness (generalized) (M62.81) ?  ? ?Time: 1722-1749 ?PT Time Calculation (min) (ACUTE ONLY): 27 min ? ? ?Charges:   PT Evaluation ?$PT Eval Low Complexity: 1 Low ?  ?  ?   ? ? ?Zenaida Niece, PT, DPT ?Acute Rehabilitation ?Pager: [phone] ?Office [phone] ? ? ?Zenaida Niece ?02/06/2022, 6:02 PM ? ?

## 2022-02-06 NOTE — Plan of Care (Signed)

## 2022-02-06 NOTE — Progress Notes (Signed)
Patient transferred from [facility]. He is post op day 2 s/p CABG. Alert and oriented, denies pain at this time. CHG bath given. Oriented to staff and unit. VS within normal limits. Fuller [place], RN ? ?

## 2022-02-06 NOTE — Progress Notes (Signed)
CARDIAC REHAB PHASE I  ? ?PRE:  Rate/Rhythm: 65 SR ? ?  BP: sitting 137/59 ? ?  SaO2: 97 6L ? ?MODE:  Ambulation: 300 ft  ? ?POST:  Rate/Rhythm: 90 SR ? ?  BP: sitting 125/53  ? ?  SaO2: 96 6L, 97 4L, 96 2L ? ?Pt scooted forward in recliner appropriately and stood with mod assist. Ambulated with  [place] and 6L, standby assist. Slow and steady. Some SOB, pt practiced PLB. SAO2 96-98 6L after walk in w/c therefore weaned down to 2L before transfer (to 4e). Pt feeling well. Practiced IS, 400-500 ml. Encouraged x1 more walks today. ?[id number] ? ?Yves Dill CES, ACSM ?02/06/2022 ?2:19 PM ? ? ? ? ?

## 2022-02-07 ENCOUNTER — Inpatient Hospital Stay (HOSPITAL_COMMUNITY): Payer: PPO

## 2022-02-07 LAB — BASIC METABOLIC PANEL
Anion gap: 6 (ref 5–15)
BUN: 23 mg/dL (ref 8–23)
CO2: 23 mmol/L (ref 22–32)
Calcium: 8.1 mg/dL — ABNORMAL LOW (ref 8.9–10.3)
Chloride: 107 mmol/L (ref 98–111)
Creatinine, Ser: 1.52 mg/dL — ABNORMAL HIGH (ref 0.61–1.24)
GFR, Estimated: 47 mL/min — ABNORMAL LOW (ref >60)
Glucose, Bld: 96 mg/dL (ref 70–99)
Potassium: 4 mmol/L (ref 3.5–5.1)
Sodium: 136 mmol/L (ref 135–145)

## 2022-02-07 LAB — CBC
HCT: 38.2 % — ABNORMAL LOW (ref 39.0–52.0)
Hemoglobin: 12.1 g/dL — ABNORMAL LOW (ref 13.0–17.0)
MCH: 27.1 pg (ref 26.0–34.0)
MCHC: 31.7 g/dL (ref 30.0–36.0)
MCV: 85.7 fL (ref 80.0–100.0)
Platelets: 172 10*3/uL (ref 150–400)
RBC: 4.46 MIL/uL (ref 4.22–5.81)
RDW: 18 % — ABNORMAL HIGH (ref 11.5–15.5)
WBC: 14.5 10*3/uL — ABNORMAL HIGH (ref 4.0–10.5)
nRBC: 0 % (ref 0.0–0.2)

## 2022-02-07 LAB — GLUCOSE, CAPILLARY
Glucose-Capillary: 102 mg/dL — ABNORMAL HIGH (ref 70–99)
Glucose-Capillary: 122 mg/dL — ABNORMAL HIGH (ref 70–99)
Glucose-Capillary: 93 mg/dL (ref 70–99)

## 2022-02-07 MED ORDER — AMIODARONE IV BOLUS ONLY 150 MG/100ML
150.0000 mg | Freq: Once | INTRAVENOUS | Status: AC
Start: 2022-02-07 — End: 2022-02-07
  Filled 2022-02-07: qty 100

## 2022-02-07 MED ORDER — MAGNESIUM SULFATE 2 GM/50ML IV SOLN
2.0000 g | Freq: Once | INTRAVENOUS | Status: AC
  Administered 2022-02-07: 2 g via INTRAVENOUS
  Filled 2022-02-07: qty 50

## 2022-02-07 MED ORDER — POTASSIUM CHLORIDE CRYS ER 20 MEQ PO TBCR
20.0000 meq | EXTENDED_RELEASE_TABLET | Freq: Once | ORAL | Status: AC
Start: 2022-02-07 — End: 2022-02-07
  Administered 2022-02-07: 20 meq via ORAL
  Filled 2022-02-07: qty 1

## 2022-02-07 MED ORDER — POTASSIUM CHLORIDE CRYS ER 20 MEQ PO TBCR
20.0000 meq | EXTENDED_RELEASE_TABLET | Freq: Every day | ORAL | Status: DC
  Administered 2022-02-07 – 2022-02-09 (×3): 20 meq via ORAL
  Filled 2022-02-07 (×3): qty 1

## 2022-02-07 MED ORDER — AMIODARONE HCL 200 MG PO TABS
400.0000 mg | ORAL_TABLET | Freq: Three times a day (TID) | ORAL | Status: AC
  Administered 2022-02-07 – 2022-02-08 (×3): 400 mg via ORAL
  Filled 2022-02-07 (×3): qty 2

## 2022-02-07 MED ORDER — FUROSEMIDE 40 MG PO TABS
40.0000 mg | ORAL_TABLET | Freq: Every day | ORAL | Status: DC
Start: 2022-02-07 — End: 2022-02-09
  Administered 2022-02-07 – 2022-02-09 (×3): 40 mg via ORAL
  Filled 2022-02-07 (×3): qty 1

## 2022-02-07 MED ORDER — AMIODARONE HCL IN DEXTROSE 360-4.14 MG/200ML-% IV SOLN
INTRAVENOUS | Status: AC
  Administered 2022-02-07: 150 mg/h
  Filled 2022-02-07: qty 200

## 2022-02-07 NOTE — TOC Initial Note (Signed)
Transition of Care (TOC) - Initial/Assessment Note  ?Marvetta Gibbons Therapist, sports, BSN ?Transitions of Care ?Unit 4E- RN Case Manager ?See Treatment Team for direct phone #  ? ? ?Patient Details  ?Name: DOCTOR SHEAHAN ?MRN: [id number] ?Date of Birth: [birth year] ? ?Transition of Care (TOC) CM/SW Contact:    ?Dahlia Client, Romeo Rabon, RN ?Phone Number: ?02/07/2022, 3:56 PM ? ?Clinical Narrative:                 ?Pt s/p CABG from home w/ wife. CM spoke with pt regarding [place] and DME needs. List provided for [facility] choice Per CMS guidelines from medicare.gov website with star ratings (copy placed in shadow chart). Pt states he would like to review options with his wife and daughter. CM will f/u in am for choice and referral. HHPT order has been placed.  ?Discussed DME need- pt prefers rollator- order has been placed. Call made to [employer] for DME need- rollator will be delivered to room prior to discharge.  ? ?Address, phone # and PCP all confirmed.  ? ?CM will follow up for [facility] referral pending pt's choice ? ?Expected Discharge Plan: [place] ?Barriers to Discharge: Continued Medical Work up ? ? ?Patient Goals and CMS Choice ?Patient states their goals for this hospitalization and ongoing recovery are:: return home ?CMS Medicare.gov Compare Post Acute Care list provided to:: Patient ?Choice offered to / list presented to : Patient ? ?Expected Discharge Plan and Services ?Expected Discharge Plan: [place] ?  ?Discharge Planning Services: CM Consult ?Post Acute Care Choice: Durable Medical Equipment, Home Health ?Living arrangements for the past 2 months: [place] ?                ?DME Arranged: Walker rolling with seat ?DME Agency: [employer] ?Date DME Agency Contacted: 02/07/22 ?Time DME Agency Contacted: 1500 ?Representative spoke with at DME Agency: Freda Munro ?HH Arranged: PT ?  ?  ?  ?  ? ?Prior Living Arrangements/Services ?Living arrangements for the past 2 months: [place] ?Lives  with:: Spouse ?Patient language and need for interpreter reviewed:: Yes ?Do you feel safe going back to the place where you live?: Yes      ?Need for Family Participation in Patient Care: Yes (Comment) ?Care giver support system in place?: Yes (comment) ?  ?Criminal Activity/Legal Involvement Pertinent to Current Situation/Hospitalization: No - Comment as needed ? ?Activities of Daily Living ?  ?  ? ?Permission Sought/Granted ?Permission sought to share information with : Customer service manager ?Permission granted to share information with : Yes, Verbal Permission Granted ?   ? Permission granted to share info w AGENCY: HH/DME ?   ?   ? ?Emotional Assessment ?Appearance:: Appears stated age ?Attitude/Demeanor/Rapport: Engaged ?Affect (typically observed): Accepting, Appropriate, Pleasant ?Orientation: : Oriented to Self, Oriented to Place, Oriented to  Time, Oriented to Situation ?Alcohol / Substance Use: Not Applicable ?Psych Involvement: No (comment) ? ?Admission diagnosis:  Sustained ventricular tachycardia [I47.20] ?Coronary artery disease [I25.10] ?Patient Active Problem List  ? Diagnosis Date Noted  ? S/P CABG x 2 02/04/2022  ? Coronary artery disease 02/04/2022  ? Ventricular tachycardia 02/01/2022  ? Sustained ventricular tachycardia 02/01/2022  ? Polycythemia 05/03/2018  ? Sepsis ([place]) 04/03/2018  ? Osteoarthritis of knee 06/06/2015  ? Benign essential hypertension 11/08/2014  ? Low testosterone 11/08/2014  ? Dyslipidemia   ? Skin lesion 10/29/2011  ? Hypertension   ? Hypothyroidism   ? Obesity   ? ?PCP:  [name], MD ?Pharmacy:   ?[place] ?[place] [address] ?Phone: [phone] Fax: [phone] ? ? ? ? ?Social Determinants of Health (SDOH) Interventions ?  ? ?Readmission Risk Interventions ? ?  02/07/2022  ?  3:55 PM  ?Readmission Risk Prevention Plan  ?Post Dischage Appt Complete  ?Medication Screening Complete   ?Transportation Screening Complete  ? ? ? ?

## 2022-02-07 NOTE — Progress Notes (Signed)
Pt went into atrial flutter rhythm at approximately 4 pm. Called PA Myron and gave PO amiodarone. P went back and forth between sinus rhythm and aflutter. PA aware and gave ordered amiodarone bolus. Pt asymptomatic throughout. Pt resting with call bell within reach.  Will continue to monitor. ? ?

## 2022-02-07 NOTE — Care Management Important Message (Signed)
Important Message ? ?Patient Details  ?Name: Dustin Lawrence ?MRN: [id number] ?Date of Birth: [birth year] ? ? ?Medicare Important Message Given:  Yes ? ? ? ? ?[name] ?02/07/2022, 9:21 AM ?

## 2022-02-07 NOTE — Progress Notes (Addendum)
? ?   ?  [address] ?      [name] [address] ?            [phone]   ? ?  ? ? ?3 Days Post-Op Procedure(s) (LRB): ?CORONARY ARTERY BYPASS GRAFTING (CABG) X TWO, USING LEFT INTERNAL MAMMARY ARTERY AND RIGHT LEG GREATER SAPHENOUS VEIN HARVESTED ENDOSCOPICALLY (N/A) ?TRANSESOPHAGEAL ECHOCARDIOGRAM (TEE) (N/A) ? ?Subjective: ?Patient resting in chair this am. He was awakened. He has already moved his bowels. ? ?Objective: ?Vital signs in last 24 hours: ?Temp:  [97.6 ?F (36.4 ?C)-98.5 ?F (36.9 ?C)] 97.8 ?F (36.6 ?C) (03/23 0356) ?Pulse Rate:  [62-86] 77 (03/23 0356) ?Cardiac Rhythm: Normal sinus rhythm (03/23 0340) ?Resp:  [12-30] 17 (03/23 0356) ?BP: (124-154)/(49-75) 127/60 (03/23 0356) ?SpO2:  [89 %-99 %] 99 % (03/23 0356) ?Weight:  [112.4 kg] 112.4 kg (03/23 0426) ? ?Pre op weight 108.9 kg ?Current Weight  ?02/07/22 112.4 kg  ? ?  ? ?Intake/Output from previous day: ?03/22 0701 - 03/23 0700 ?In: 4.1 [I.V.:4.1] ?Out: 1900 [Urine:1900] ? ? ?Physical Exam: ? ?Cardiovascular: RRR ?Pulmonary: Diminished bibasilar breath sounds ?Abdomen: Soft, non tender, bowel sounds present. ?Extremities: Mild bilateral lower extremity edema. ?Wounds: Aquacel removed and sternal wound is clean and dry.  No erythema or signs of infection. RLE wounds is clean and dry ? ?Lab Results: ?CBC: ?Recent Labs  ?  02/06/22 ?0413 02/07/22 ?0228  ?WBC 14.8* 14.5*  ?HGB 11.4* 12.1*  ?HCT 37.8* 38.2*  ?PLT 160 172  ? ?BMET:  ?Recent Labs  ?  02/06/22 ?0748 02/07/22 ?0228  ?NA 134* 136  ?K 4.5 4.0  ?CL 102 107  ?CO2 25 23  ?GLUCOSE 155* 96  ?BUN 22 23  ?CREATININE 1.52* 1.52*  ?CALCIUM 8.0* 8.1*  ?  ?PT/INR:  ?Lab Results  ?Component Value Date  ? INR 1.3 (H) 02/04/2022  ? INR 1.1 02/04/2022  ? INR 1.00 04/03/2018  ? ?ABG:  ?INR: ?Will add last result for INR, ABG once components are confirmed ?Will add last 4 CBG results once components are confirmed ? ?Assessment/Plan: ?CV-History of SVT. First degree heart block, SR. On  Lopressor 12.5 mg bid. ?Pulmonary-on 2 liters of oxygen via [place]. Wean as able over next few days. CXR this am appears to show cardiomegaly, bibasilar atelectasis/small left pleural effusion. Encourage incentive spirometer. ?Volume overload-needs diuresis but with creatinine 1.52 again this am, will discuss with Dr. Kipp Brood ?Expected post op blood loss anemia-H and H this am slightly decreased to 12.1 and 38.2 ?5. CBGs 112/102/93. Pre op HGA1C 6. He will need further surveillance with medical doctor after discharge. Will provide nutritional information with discharge paperwork. Stop accu checks and SS PRN ?6. Creatinine this am remains 1.52.. Creatinine upon admission was 1.37 ?7. History of hypothyroidism-continue Levothyroxine 175 mcg daily ?8. On Lovenox for DVT prophylaxis ? ? ? ?Donielle M ZimmermanPA-C ?6:59 AM ? ?Agree with above ?Diuresis today ?Dispo planning ? ?Lucile Crater Christine Morton ? ?  ?

## 2022-02-07 NOTE — Progress Notes (Signed)
? ?   ?  [address] ?      [name] [address] ?            [phone]   ? ?  ?Dustin Lawrence developed atrial flutter while walking this afternoon.  His ventricular rate was in the 110-120 range.  Blood pressure was stable.  IV amiodarone loading was being considered but his nurse reported Dustin Lawrence only had a small bore IV in his hand and otherwise had a history of difficult IV access.  While I was on the phone with his nurse, Dustin Lawrence converted back to sinus rhythm.  We will begin loading with oral amiodarone at 400 mg every 8 hours for 3 doses then 40 mg by mouth twice daily thereafter. ?Check electrolytes and magnesium in a.m. ? ?M. Henriette Combs ?[phone] ?

## 2022-02-07 NOTE — Progress Notes (Signed)
Mobility Specialist Progress Note ? ? 02/07/22 1516  ?Mobility  ?Activity Ambulated with assistance in hallway  ?Level of Assistance Minimal assist, patient does 75% or more  ?Assistive Device Front wheel walker  ?RUE Weight Bearing NWB  ?Distance Ambulated (ft) 240 ft  ?Activity Response Tolerated well  ?$Mobility charge 1 Mobility  ? ?Pt having no presenting pain and agreeable to mobility. X2 standing rest breaks d/t DOE but O2 remaining in between 88% - 94% SpO2. Returned back to chair w/ pt's HR remaining >121bpm, pt having no symptoms but RN notified.    ? ?Pre Mobility: 80 HR, 114/66 BP, 96% SpO2 ?During Mobility: 98 HR, 88% - 94% SpO2 ?Post Mobility: 132 HR, 160/44 BP, 94% SpO2 ? ?Holland Falling ?Mobility Specialist ?Phone Number [phone] ? ?

## 2022-02-07 NOTE — Progress Notes (Signed)
PT Cancellation Note ? ?Patient Details ?Name: Dustin Lawrence ?MRN: [id number] ?DOB: Feb 24, 1946 ? ? ?Cancelled Treatment:    Reason Eval/Treat Not Completed: (P) Medical issues which prohibited therapy (RN defer PT tx session due to pt recent aflutter.) Will continue efforts next date per PT plan of care as schedule permits. ? ? ?Kara Pacer Josanne Boerema ?02/07/2022, 5:06 PM ? ? ?

## 2022-02-07 NOTE — Progress Notes (Signed)
? ?   ?  [address] ?      [name] [address] ?            [phone]   ? ?  ?Mr. Silverman has continued to have brief episodes of paroxysmal atrial flutter this afternoon.  He is tolerating this reasonably well.  We will plan to load with 150 mg amiodarone IV one-time bolus and continue the oral load as already ordered. ? ?M.Kylen Schliep, PA-C ?

## 2022-02-07 NOTE — Progress Notes (Signed)
CARDIAC REHAB PHASE I  ? ?PRE:  Rate/Rhythm: 90 SR ? ?  BP: sitting 152/67 ? ?  SaO2: 95 RA ? ?MODE:  Ambulation: 220 ft  ? ?POST:  Rate/Rhythm: 111 ST with PVC/PACs ? ?  BP: sitting 162/71  ? ?  SaO2: 93 RA on ear walking ? ?Pt sts he is feeling tired/weak today. Exerted moving to edge of chair. Stood with rocking, min assist. Standby assist with RW, slow and steady. Rest x2 for fatigue/SOB. saO2 registered well on ear, maintained 93-95 RA walking. Return to recliner. Encouraged IS and x2 more walks. ?[phone]  ? ?Yves Dill CES, ACSM ?02/07/2022 ?12:02 PM ? ? ? ? ?

## 2022-02-08 LAB — BASIC METABOLIC PANEL
Anion gap: 8 (ref 5–15)
BUN: 21 mg/dL (ref 8–23)
CO2: 24 mmol/L (ref 22–32)
Calcium: 8.2 mg/dL — ABNORMAL LOW (ref 8.9–10.3)
Chloride: 105 mmol/L (ref 98–111)
Creatinine, Ser: 1.33 mg/dL — ABNORMAL HIGH (ref 0.61–1.24)
GFR, Estimated: 56 mL/min — ABNORMAL LOW (ref >60)
Glucose, Bld: 106 mg/dL — ABNORMAL HIGH (ref 70–99)
Potassium: 4.2 mmol/L (ref 3.5–5.1)
Sodium: 137 mmol/L (ref 135–145)

## 2022-02-08 LAB — BPAM RBC
Blood Product Expiration Date: 202304152359
Blood Product Expiration Date: 202304162359
Unit Type and Rh: 5100
Unit Type and Rh: 5100

## 2022-02-08 LAB — MAGNESIUM: Magnesium: 2.1 mg/dL (ref 1.7–2.4)

## 2022-02-08 LAB — TYPE AND SCREEN
ABO/RH(D): O POS
Antibody Screen: NEGATIVE
Unit division: 0
Unit division: 0

## 2022-02-08 MED ORDER — METOPROLOL TARTRATE 25 MG/10 ML ORAL SUSPENSION
25.0000 mg | Freq: Two times a day (BID) | ORAL | Status: DC
Start: 2022-02-08 — End: 2022-02-09

## 2022-02-08 MED ORDER — METOPROLOL TARTRATE 25 MG PO TABS
25.0000 mg | ORAL_TABLET | Freq: Two times a day (BID) | ORAL | Status: DC
  Administered 2022-02-08 – 2022-02-09 (×3): 25 mg via ORAL
  Filled 2022-02-08 (×3): qty 1

## 2022-02-08 MED FILL — Sodium Bicarbonate IV Soln 8.4%: INTRAVENOUS | Qty: 50 | Status: AC

## 2022-02-08 MED FILL — Sodium Chloride IV Soln 0.9%: INTRAVENOUS | Qty: 3000 | Status: AC

## 2022-02-08 MED FILL — Mannitol IV Soln 20%: INTRAVENOUS | Qty: 500 | Status: AC

## 2022-02-08 MED FILL — Calcium Chloride Inj 10%: INTRAVENOUS | Qty: 10 | Status: AC

## 2022-02-08 MED FILL — Electrolyte-R (PH 7.4) Solution: INTRAVENOUS | Qty: 3000 | Status: AC

## 2022-02-08 MED FILL — Heparin Sodium (Porcine) Inj 1000 Unit/ML: INTRAMUSCULAR | Qty: 10 | Status: AC

## 2022-02-08 NOTE — TOC Progression Note (Signed)
Transition of Care (TOC) - Progression Note  ?Marvetta Gibbons Therapist, sports, BSN ?Transitions of Care ?Unit 4E- RN Case Manager ?See Treatment Team for direct phone #  ? ? ?Patient Details  ?Name: Dustin Lawrence ?MRN: [id number] ?Date of Birth: 1946-08-25 ? ?Transition of Care (TOC) CM/SW Contact  ?Dawayne Patricia, RN ?Phone Number: ?02/08/2022, 11:10 AM ? ?Clinical Narrative:    ?Follow up done with pt for [facility] choice- per pt he and his wife have not decided yet on [facility] and requesting more time to speak with some friends about it. CM will have weekend [facility] staff follow-up for choice and referral.  ?[employer] also reached out and they are currently out of stock in house for rollators and expect to have some on Monday- otherwise can drop ship one to pt on discharge- discussed this with pt and he is ok with drop shipping rollator to home if needed. Per pt he may be here over the weekend.  ? ?Call made to [employer] to have rollator placed on hold - TOC will reach back out to them pending date of discharge to have either delivered here (if pt stays until Monday-may have back in stock) vs having it drop shipped to home- [facility] will notify [employer] which way to proceed. Pt agreeable to plan.  ? ? ?Expected Discharge Plan: [place] ?Barriers to Discharge: Continued Medical Work up ? ?Expected Discharge Plan and Services ?Expected Discharge Plan: [place] ?  ?Discharge Planning Services: CM Consult ?Post Acute Care Choice: Durable Medical Equipment, Home Health ?Living arrangements for the past 2 months: [place] ?                ?DME Arranged: Walker rolling with seat ?DME Agency: [employer] ?Date DME Agency Contacted: 02/07/22 ?Time DME Agency Contacted: 1500 ?Representative spoke with at DME Agency: Freda Munro ?HH Arranged: PT ?  ?  ?  ?  ? ? ?Social Determinants of Health (SDOH) Interventions ?  ? ?Readmission Risk Interventions ? ?  02/07/2022  ?  3:55 PM  ?Readmission Risk Prevention Plan  ?Post  Dischage Appt Complete  ?Medication Screening Complete  ?Transportation Screening Complete  ? ? ?

## 2022-02-08 NOTE — Progress Notes (Signed)
CARDIAC REHAB PHASE I  ? ?PRE:  Rate/Rhythm: 72 SR ? ?  BP: sitting 136/50 ? ?  SaO2: 96 RA ? ?MODE:  Ambulation: 330 ft  ? ?POST:  Rate/Rhythm: 94 SR ? ?  BP: sitting 136/84  ? ?  SaO2: 99 RA ? ?Pt scooted to edge of recliner with verbal reminders. Can stand with rocking. Slow and steady with RW, rest x1. VSS, fatigue with distance. Return to recliner. ? ?Discussed with pt and wife IS, sternal precautions, diet, exercise, and CRPII. Will refer to [place]. Set up d/c video. ?[phone]  ? ?Yves Dill CES, ACSM ?02/08/2022 ?1:31 PM ? ? ? ? ?

## 2022-02-08 NOTE — Progress Notes (Signed)
PT Cancellation Note ? ?Patient Details ?Name: Dustin Lawrence ?MRN: [id number] ?DOB: 1946/04/13 ? ? ?Cancelled Treatment:    Reason Eval/Treat Not Completed: Other (comment) Pt working with cardiac rehab. Will follow up as time allows. ? ? ?[place] ?02/08/2022, 3:43 PM ?Marisa Severin, PT, DPT ?Acute Rehabilitation Services ?Pager [phone] ?Office [phone] ? ? ? ?

## 2022-02-08 NOTE — Progress Notes (Signed)
Mobility Specialist Progress Note  ? ? 02/08/22 1010  ?Mobility  ?Activity Ambulated with assistance in hallway  ?Level of Assistance Minimal assist, patient does 75% or more  ?Assistive Device Front wheel walker  ?Distance Ambulated (ft) 156 ft  ?Activity Response Tolerated well  ?$Mobility charge 1 Mobility  ? ?Pre-Mobility: 76 HR, 116/85 BP ?During Mobility: 109 HR ?Post-Mobility: 85 HR ? ?Pt received in chair and agreeable. C/o being tired. Returned to chair with call bell in reach.  ? ?Hildred Alamin ?Mobility Specialist  ?  ?

## 2022-02-08 NOTE — Progress Notes (Addendum)
? ?   ?[address] ?      [name] [address] ?            [phone]   ? ?  4 Days Post-Op Procedure(s) (LRB): ?CORONARY ARTERY BYPASS GRAFTING (CABG) X TWO, USING LEFT INTERNAL MAMMARY ARTERY AND RIGHT LEG GREATER SAPHENOUS VEIN HARVESTED ENDOSCOPICALLY (N/A) ?TRANSESOPHAGEAL ECHOCARDIOGRAM (TEE) (N/A) ?Subjective: ?Feels fair this am ? ?Objective: ?Vital signs in last 24 hours: ?Temp:  [98 ?F (36.7 ?C)-98.4 ?F (36.9 ?C)] 98 ?F (36.7 ?C) (03/24 0455) ?Pulse Rate:  [76-89] 82 (03/24 0455) ?Cardiac Rhythm: Normal sinus rhythm (03/24 0331) ?Resp:  [17-20] 20 (03/24 0455) ?BP: (114-152)/(52-81) 140/61 (03/24 0455) ?SpO2:  [90 %-99 %] 90 % (03/24 0455) ? ?Hemodynamic parameters for last 24 hours: ?  ? ?Intake/Output from previous day: ?03/23 0701 - 03/24 0700 ?In: 0  ?Out: 1175 [Urine:1175] ?Intake/Output this shift: ?Total I/O ?In: -  ?Out: 300 [Urine:300] ? ?General appearance: alert, cooperative, and no distress ?Heart: regular rate and rhythm ?Lungs: dim left> right base ?Abdomen: some distensdion, non tender, + BS ?Extremities: minor edema ?Wound: incis healing well ? ?Lab Results: ?Recent Labs  ?  02/06/22 ?0413 02/07/22 ?6203  ?WBC 14.8* 14.5*  ?HGB 11.4* 12.1*  ?HCT 37.8* 38.2*  ?PLT 160 172  ? ?BMET:  ?Recent Labs  ?  02/07/22 ?0228 02/08/22 ?0213  ?NA 136 137  ?K 4.0 4.2  ?CL 107 105  ?CO2 23 24  ?GLUCOSE 96 106*  ?BUN 23 21  ?CREATININE 1.52* 1.33*  ?CALCIUM 8.1* 8.2*  ?  ?PT/INR: No results for input(s): LABPROT, INR in the last 72 hours. ?ABG ?   ?Component Value Date/Time  ? PHART 7.249 (L) 02/04/2022 1959  ? HCO3 21.2 02/04/2022 1959  ? TCO2 23 02/04/2022 1959  ? ACIDBASEDEF 6.0 (H) 02/04/2022 1959  ? O2SAT 96 02/04/2022 1959  ? ?CBG (last 3)  ?Recent Labs  ?  02/07/22 ?0020 02/07/22 ?0357 02/07/22 ?1133  ?GLUCAP 102* 93 122*  ? ? ?Meds ?Scheduled Meds: ? acetaminophen  1,000 mg Oral Q6H  ? Or  ? acetaminophen (TYLENOL) oral liquid 160 mg/5 mL  1,000 mg Per Tube Q6H  ? aspirin EC  325  mg Oral Daily  ? Or  ? aspirin  324 mg Per Tube Daily  ? atorvastatin  80 mg Oral Daily  ? bisacodyl  10 mg Oral Daily  ? Or  ? bisacodyl  10 mg Rectal Daily  ? cholecalciferol  1,000 Units Oral Daily  ? docusate sodium  200 mg Oral Daily  ? enoxaparin (LOVENOX) injection  30 mg Subcutaneous QHS  ? furosemide  40 mg Oral Daily  ? gabapentin  200 mg Oral BID  ? levothyroxine  175 mcg Oral Q0600  ? lidocaine  2 patch Transdermal Q24H  ? metoprolol tartrate  12.5 mg Oral BID  ? Or  ? metoprolol tartrate  12.5 mg Per Tube BID  ? multivitamin  1 tablet Oral Daily  ? pantoprazole  40 mg Oral Daily  ? potassium chloride  20 mEq Oral Daily  ? ?Continuous Infusions: ? lactated ringers    ? lactated ringers Stopped (02/04/22 1520)  ? lactated ringers 20 mL/hr at 02/06/22 0700  ? ?PRN Meds:.lactated ringers, metoprolol tartrate, morphine injection, ondansetron (ZOFRAN) IV, oxyCODONE, traMADol ? ?Xrays ?DG Chest 2 View ? ?Result Date: 02/07/2022 ?CLINICAL DATA:  Sustained ventricular tachycardia. EXAM: CHEST - 2 VIEW COMPARISON:  Portable chest yesterday at 5:19 a.m. FINDINGS: PA  Lat exam is obtained  at 4:46 a.m., 02/07/2022. Interval removal of right IJ catheter and introducer sheath. Sternotomy sutures are intact and midline. There is no visible pneumothorax with CABG change again shown. Small left pleural effusion with overlying atelectasis or consolidation is unchanged allowing for differences in technique. The heart is enlarged. No vascular congestion is seen. There is aortic atherosclerosis, stable mediastinum. The remaining lungs clear. The right sulci are sharp. Overall aeration seems unchanged. IMPRESSION: No significant change in the overall aeration. Small left pleural effusion continues to be seen with overlying atelectasis or consolidation. There is cardiomegaly with CABG changes without evidence of CHF. Electronically Signed   By: Telford Nab M.D.   On: 02/07/2022 05:31   ? ?Assessment/Plan: ?S/P Procedure(s)  (LRB): ?CORONARY ARTERY BYPASS GRAFTING (CABG) X TWO, USING LEFT INTERNAL MAMMARY ARTERY AND RIGHT LEG GREATER SAPHENOUS VEIN HARVESTED ENDOSCOPICALLY (N/A) ?TRANSESOPHAGEAL ECHOCARDIOGRAM (TEE) (N/A) ?POD#4 ? ?1 afeb, VSS S BP 114-152, PAF yesterday- conts amio, currently sinus with freq PVC's including bigemminy, will increase beta blocker a little ?2 sats good on RA ?3 not weighed yet today, good UOP, creat trending down , lasix for expected post operative volume overload ?4 BS well controlled ?6 cont cardiac rehab/pulm hygiene ?7 home with wife, hopefully 1-2 days ? ? ? ? LOS: 7 days  ? ? ?John Giovanni PA-C ?Pager [phone] ?02/08/2022 ?  ?Agree with above. ?Continue diuresis. ?Dispo planning. ? ?Lajuana Matte ? ?

## 2022-02-09 MED ORDER — OXYCODONE HCL 5 MG PO TABS: 5.0000 mg | ORAL_TABLET | Freq: Four times a day (QID) | ORAL | 0 refills | Status: AC | PRN

## 2022-02-09 MED ORDER — ATORVASTATIN CALCIUM 80 MG PO TABS: 80.0000 mg | ORAL_TABLET | Freq: Every day | ORAL | 1 refills | Status: DC

## 2022-02-09 MED ORDER — POTASSIUM CHLORIDE CRYS ER 20 MEQ PO TBCR: 20.0000 meq | EXTENDED_RELEASE_TABLET | Freq: Every day | ORAL | 0 refills | Status: DC

## 2022-02-09 MED ORDER — FUROSEMIDE 40 MG PO TABS: 40.0000 mg | ORAL_TABLET | Freq: Every day | ORAL | 0 refills | Status: DC

## 2022-02-09 MED ORDER — ASPIRIN 325 MG PO TBEC: 325.0000 mg | DELAYED_RELEASE_TABLET | Freq: Every day | ORAL | Status: DC

## 2022-02-09 MED ORDER — LOSARTAN POTASSIUM 25 MG PO TABS: 25.0000 mg | ORAL_TABLET | Freq: Every day | ORAL | 1 refills | Status: DC

## 2022-02-09 NOTE — Progress Notes (Signed)
CT sutures removed per order.  Benzoine and steri strips applied.  Pt tolerated well and understands strips will fall off by themselves.  Await clothes from wife for DC home ?

## 2022-02-09 NOTE — TOC CM/SW Note (Addendum)
Spoke with Mr. Brickle about home health choice. Mr. Brislin states he does not want writer to arrange home health prior to discharge because he wants to do more research on the home health agencies that are in network with his HTA insurance. States he does not want to feel rushed to make a decision. Advised Mr. Keats will have to follow up with his outpatient physician offices for home health arrangements since he is declining [place] arrangements being made from hospital. Mr. Potempa expressed understanding.  ? ?Writer spoke with [facility] from [place] who states rollator will have be delivered to home either Monday or Tuesday. Can not be drop shipped since rollator has more pieces than a regular walker. ? ?Made Mr. Kines aware rollator will be delivered to his home either Monday or Tuesday. Mr. Mclees expresses understanding. ? ?Made patient's nurse and PA aware of information above. ? ? ?Marthenia Rolling, MSN, RN,BSN ?Inpatient [facility] Case Manager ?[phone]   ?

## 2022-02-09 NOTE — Progress Notes (Signed)
? ?   ?[address] ?      [name] [address] ?            [phone]   ? ?  5 Days Post-Op Procedure(s) (LRB): ?CORONARY ARTERY BYPASS GRAFTING (CABG) X TWO, USING LEFT INTERNAL MAMMARY ARTERY AND RIGHT LEG GREATER SAPHENOUS VEIN HARVESTED ENDOSCOPICALLY (N/A) ?TRANSESOPHAGEAL ECHOCARDIOGRAM (TEE) (N/A) ?Subjective: ?Conts to feel better ? ?Objective: ?Vital signs in last 24 hours: ?Temp:  [97.8 ?F (36.6 ?C)-98.8 ?F (37.1 ?C)] 98.3 ?F (36.8 ?C) (03/25 2703) ?Pulse Rate:  [61-78] 78 (03/25 0733) ?Cardiac Rhythm: Normal sinus rhythm (03/24 1900) ?Resp:  [16-20] 16 (03/25 0733) ?BP: (110-145)/(50-76) 129/50 (03/25 0733) ?SpO2:  [91 %-94 %] 94 % (03/25 0733) ? ?Hemodynamic parameters for last 24 hours: ?  ? ?Intake/Output from previous day: ?03/24 0701 - 03/25 0700 ?In: 563.3 [P.O.:480; I.V.:83.3] ?Out: 2325 [Urine:2325] ?Intake/Output this shift: ?No intake/output data recorded. ? ?General appearance: alert, cooperative, and no distress ?Heart: regular rate and rhythm and occas extrasystole ?Lungs: clear to auscultation bilaterally ?Abdomen: obese, benign ?Extremities: trace edema ?Wound: incis healing well ? ?Lab Results: ?Recent Labs  ?  02/07/22 ?0228  ?WBC 14.5*  ?HGB 12.1*  ?HCT 38.2*  ?PLT 172  ? ?BMET:  ?Recent Labs  ?  02/07/22 ?0228 02/08/22 ?0213  ?NA 136 137  ?K 4.0 4.2  ?CL 107 105  ?CO2 23 24  ?GLUCOSE 96 106*  ?BUN 23 21  ?CREATININE 1.52* 1.33*  ?CALCIUM 8.1* 8.2*  ?  ?PT/INR: No results for input(s): LABPROT, INR in the last 72 hours. ?ABG ?   ?Component Value Date/Time  ? PHART 7.249 (L) 02/04/2022 1959  ? HCO3 21.2 02/04/2022 1959  ? TCO2 23 02/04/2022 1959  ? ACIDBASEDEF 6.0 (H) 02/04/2022 1959  ? O2SAT 96 02/04/2022 1959  ? ?CBG (last 3)  ?Recent Labs  ?  02/07/22 ?0020 02/07/22 ?0357 02/07/22 ?1133  ?GLUCAP 102* 93 122*  ? ? ?Meds ?Scheduled Meds: ? acetaminophen  1,000 mg Oral Q6H  ? Or  ? acetaminophen (TYLENOL) oral liquid 160 mg/5 mL  1,000 mg Per Tube Q6H  ? aspirin EC   325 mg Oral Daily  ? Or  ? aspirin  324 mg Per Tube Daily  ? atorvastatin  80 mg Oral Daily  ? bisacodyl  10 mg Oral Daily  ? Or  ? bisacodyl  10 mg Rectal Daily  ? cholecalciferol  1,000 Units Oral Daily  ? docusate sodium  200 mg Oral Daily  ? enoxaparin (LOVENOX) injection  30 mg Subcutaneous QHS  ? furosemide  40 mg Oral Daily  ? gabapentin  200 mg Oral BID  ? levothyroxine  175 mcg Oral Q0600  ? lidocaine  2 patch Transdermal Q24H  ? metoprolol tartrate  25 mg Oral BID  ? Or  ? metoprolol tartrate  25 mg Per Tube BID  ? multivitamin  1 tablet Oral Daily  ? pantoprazole  40 mg Oral Daily  ? potassium chloride  20 mEq Oral Daily  ? ?Continuous Infusions: ? lactated ringers    ? lactated ringers Stopped (02/04/22 1520)  ? lactated ringers 20 mL/hr at 02/06/22 0700  ? ?PRN Meds:.lactated ringers, metoprolol tartrate, morphine injection, ondansetron (ZOFRAN) IV, oxyCODONE, traMADol ? ?Xrays ?No results found. ? ?Assessment/Plan: ?S/P Procedure(s) (LRB): ?CORONARY ARTERY BYPASS GRAFTING (CABG) X TWO, USING LEFT INTERNAL MAMMARY ARTERY AND RIGHT LEG GREATER SAPHENOUS VEIN HARVESTED ENDOSCOPICALLY (N/A) ?TRANSESOPHAGEAL ECHOCARDIOGRAM (TEE) (N/A) ? ?POD#5 ? ?1 afeb, VSS,  BP should be bale to tolerate lower dose ARB which he was on preop sinus rhythm, PVC's, normal EFx ?2 sats good on RA ?3 good diuresis- will cont for 5 additional days ?4 weight not recorded ?5 no new labs or xrays ?6 stable for d/c ? ? ? ? LOS: 8 days  ? ? ?Dustin Giovanni PA-C ? pager [phone] ?02/09/2022 ?  ?

## 2022-02-12 ENCOUNTER — Telehealth: Payer: Self-pay | Admitting: Cardiology

## 2022-02-12 ENCOUNTER — Other Ambulatory Visit: Payer: Self-pay

## 2022-02-12 DIAGNOSIS — Z951 Presence of aortocoronary bypass graft: Secondary | ICD-10-CM

## 2022-02-12 DIAGNOSIS — J189 Pneumonia, unspecified organism: Secondary | ICD-10-CM | POA: Diagnosis not present

## 2022-02-12 DIAGNOSIS — R531 Weakness: Secondary | ICD-10-CM | POA: Diagnosis not present

## 2022-02-12 NOTE — Telephone Encounter (Signed)
Called patients wife and she wanted to clarify the dose of patients Metoprolol Succinate. I informed her that per Dr. Claudie Revering last [place] note on 01/30/22, he is to take 50 mg twice a day. ? ?Patients wife was very grateful for the follow up . ?

## 2022-02-12 NOTE — Telephone Encounter (Signed)
Called patients wife and informed her that they will need to call Dr. Abran Duke office for this. Patients wife verbalized understanding and agreed with plan. ?

## 2022-02-12 NOTE — Telephone Encounter (Signed)
Patient wife calling is requesting authorization of home care of husband for insurance purposes, please assist ?

## 2022-02-12 NOTE — Telephone Encounter (Signed)
Pt c/o medication issue:  1. Name of Medication: Metoprolol   2. How are you currently taking this medication (dosage and times per day)? Once a day, 50 mg, but patient and his wife are confused if he should be taking 2. States his D/C papers advised him to take 2 a day  3. Are you having a reaction (difficulty breathing--STAT)? no  4. What is your medication issue? Needs clarification on doseage

## 2022-02-12 NOTE — Telephone Encounter (Signed)
Authorization requested is due to bypass surgery ?

## 2022-02-15 ENCOUNTER — Ambulatory Visit (INDEPENDENT_AMBULATORY_CARE_PROVIDER_SITE_OTHER): Payer: Self-pay | Admitting: Thoracic Surgery (Cardiothoracic Vascular Surgery)

## 2022-02-15 DIAGNOSIS — Z951 Presence of aortocoronary bypass graft: Secondary | ICD-10-CM

## 2022-02-15 NOTE — Progress Notes (Signed)
?   ?  [address] ?      [name] [address] ?            [phone]      ? ?Patient: Home ?Provider: Office ?Consent for Telemedicine visit obtained. ? ?Today?s visit was completed via a real-time telehealth (see specific modality noted below). The patient/authorized person provided oral consent at the time of the visit to engage in a telemedicine encounter with the present provider at [facility]. The patient/authorized person was informed of the potential benefits, limitations, and risks of telemedicine. The patient/authorized person expressed understanding that the laws that protect confidentiality also apply to telemedicine. The patient/authorized person acknowledged understanding that telemedicine does not provide emergency services and that he or she would need to call 911 or proceed to the nearest hospital for help if such a need arose. ? ? Total time spent in the clinical discussion 10 minutes. ? Telehealth Modality: Phone visit (audio only) ? ?I had a telephone visit with  [name] who is s/p CABG.  Overall doing well.   ?Pain is minimal.  Ambulating well. He hasn't been sleeping well, but has no other complaints.  Vitals have been stable.  [name] will see [place] back in 1 month with a chest x-ray for cardiac rehab clearance. ? ?Lajuana Matte ? ?

## 2022-02-18 ENCOUNTER — Telehealth: Payer: Self-pay

## 2022-02-18 NOTE — Telephone Encounter (Signed)
Patient's wife, June contacted the office requesting home health services for patient. He is s/p CABG x2 with Dr. Kipp Brood 02/04/22. She states that they did not choose a home health agency at discharge from the hospital because they wanted to do more research before picking an agency. She is requesting services now. Sent paperwork into [facility] per patient's preferences. Received phone call 02/14/22 with denial of home health services. Contacted patient back and made aware, patient's wife acknowledged receipt. She states that patient is doing much better and walking as needed, following sternal precautions. ?

## 2022-02-21 ENCOUNTER — Ambulatory Visit (INDEPENDENT_AMBULATORY_CARE_PROVIDER_SITE_OTHER): Payer: PPO

## 2022-02-21 ENCOUNTER — Ambulatory Visit: Payer: PPO | Admitting: Cardiology

## 2022-02-21 ENCOUNTER — Encounter: Payer: Self-pay | Admitting: Cardiology

## 2022-02-21 VITALS — BP 120/64 | HR 66 | Ht 68.0 in | Wt 238.0 lb

## 2022-02-21 DIAGNOSIS — I472 Ventricular tachycardia, unspecified: Secondary | ICD-10-CM

## 2022-02-21 DIAGNOSIS — I10 Essential (primary) hypertension: Secondary | ICD-10-CM | POA: Diagnosis not present

## 2022-02-21 DIAGNOSIS — Z951 Presence of aortocoronary bypass graft: Secondary | ICD-10-CM

## 2022-02-21 DIAGNOSIS — E782 Mixed hyperlipidemia: Secondary | ICD-10-CM | POA: Diagnosis not present

## 2022-02-21 NOTE — Patient Instructions (Signed)
Medication Instructions:  ?No changes at this time.  ? ?*If you need a refill on your cardiac medications before your next appointment, please call your pharmacy* ? ? ?Lab Work: ?Fasting lipid panel at your convenience. Nothing to eat or drink after midnight before except for sip of water with medications. Go to Medical Mall Entrance at [facility] and check in at registration desk.  ? ?Lab hours: Monday- Friday (7:30 am- 5:30 pm) ?  ?If you have labs (blood work) drawn today and your tests are completely normal, you will receive your results only by: ?MyChart Message (if you have MyChart) OR ?A paper copy in the mail ?If you have any lab test that is abnormal or we need to change your treatment, we will call you to review the results. ? ? ?Testing/Procedures: ?Your provider has ordered a heart monitor to wear for 14 days. This will be mailed to your home with instructions on placement. Once you have finished the time frame requested, you will return monitor in box provided. ? ? ? ? ? ?Follow-Up: ?At [facility], you and your health needs are our priority.  As part of our continuing mission to provide you with exceptional heart care, we have created designated Provider Care Teams.  These Care Teams include your primary Cardiologist (physician) and Advanced Practice Providers (APPs -  Physician Assistants and Nurse Practitioners) who all work together to provide you with the care you need, when you need it. ? ? ?Your next appointment:   ?6 week(s) ? ?The format for your next appointment:   ?In Person ? ?Provider:   ?[name], MD ?
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
## 2022-02-25 DIAGNOSIS — Z951 Presence of aortocoronary bypass graft: Secondary | ICD-10-CM | POA: Diagnosis not present

## 2022-02-25 DIAGNOSIS — I472 Ventricular tachycardia, unspecified: Secondary | ICD-10-CM

## 2022-03-01 ENCOUNTER — Ambulatory Visit (HOSPITAL_COMMUNITY): Payer: PPO

## 2022-03-12 ENCOUNTER — Other Ambulatory Visit: Payer: PPO

## 2022-03-12 ENCOUNTER — Other Ambulatory Visit
Admission: RE | Admit: 2022-03-12 | Discharge: 2022-03-12 | Disposition: A | Discharge status: Home / Self Care | Payer: PPO | Attending: Cardiology | Admitting: Cardiology

## 2022-03-12 DIAGNOSIS — I472 Ventricular tachycardia, unspecified: Secondary | ICD-10-CM | POA: Insufficient documentation

## 2022-03-12 DIAGNOSIS — Z951 Presence of aortocoronary bypass graft: Secondary | ICD-10-CM | POA: Insufficient documentation

## 2022-03-12 LAB — LIPID PANEL
Cholesterol: 111 mg/dL (ref 0–200)
HDL: 40 mg/dL — ABNORMAL LOW (ref >40)
LDL Cholesterol: 39 mg/dL (ref 0–99)
Total CHOL/HDL Ratio: 2.8 RATIO
Triglycerides: 161 mg/dL — ABNORMAL HIGH (ref <150)
VLDL: 32 mg/dL (ref 0–40)

## 2022-03-13 ENCOUNTER — Other Ambulatory Visit: Payer: Self-pay | Admitting: Thoracic Surgery (Cardiothoracic Vascular Surgery)

## 2022-03-13 DIAGNOSIS — Z951 Presence of aortocoronary bypass graft: Secondary | ICD-10-CM

## 2022-03-13 NOTE — Progress Notes (Signed)
? ?   ?[address] ?      [name] [address] ?            [phone]   ? ?  ? ?HPI: ?Patient returns for routine postoperative follow-up having undergone CABG x2 on 02/04/2022 3 by Dr. Kipp Brood. ?The patient's early postoperative recovery while in the hospital was notable for mild acute kidney injury with peak creatinine 1.5.  His renal function trending back to normal prior to discharge.  He otherwise made an uneventful recovery while in the hospital.  He had some ventricular arrhythmias that were controlled with oral beta-blockers. ?Since hospital discharge the patient reports he has continued to gain strength and endurance.  He has occasional pain in his chest and shoulders but he says this is improving gradually.  He has not been aware of any irregular heartbeat.  He has just completed long-term heart monitoring with a Zio patch but has not yet received the results. ? ? ?Current Outpatient Medications  ?Medication Sig Dispense Refill  ? aspirin EC 325 MG EC tablet Take 1 tablet (325 mg total) by mouth daily.    ? atorvastatin (LIPITOR) 80 MG tablet Take 1 tablet (80 mg total) by mouth daily. 30 tablet 1  ? Biotin 1 MG CAPS Take 1 mg by mouth daily.    ? cholecalciferol (VITAMIN D) 1000 units tablet Take 1,000 Units by mouth daily.    ? levothyroxine (SYNTHROID) 175 MCG tablet Take 175 mcg by mouth every morning.    ? losartan (COZAAR) 25 MG tablet Take 1 tablet (25 mg total) by mouth daily. 30 tablet 1  ? metoprolol succinate (TOPROL-XL) 50 MG 24 hr tablet Take 1 tablet (50 mg total) by mouth in the morning and at bedtime. Take with or immediately following a meal. 60 tablet 6  ? Multiple Vitamin (MULTIVITAMIN WITH MINERALS) TABS tablet Take 1 tablet by mouth daily.    ? testosterone cypionate (DEPOTESTOSTERONE CYPIONATE) 200 MG/ML injection Inject 140 mg into the muscle every 14 (fourteen) days.    ? triamcinolone ointment (KENALOG) 0.1 % Apply 1 application. topically 2 (two) times daily  as needed (itching).    ? ?No current facility-administered medications for this visit.  ? ? ?Physical Exam: ?Vital signs ?BP 128/71 ?Pulse 71 ?Respirations 20 ?SPO2 93% on room air ? ?Heart: Regular rate and rhythm ?Chest: Breath sounds are full, equal, and clear to auscultation.  Chest x-ray is satisfactory with continued clearing of small pleural effusions.  Lung fields are clear.  Sternotomy incision is well-healed.  Sternum is stable. ?Extremities: No peripheral edema.  Right lower extremity EVH incision is well-healed. ? ? ? ?Diagnostic Tests: ? ?CLINICAL DATA:  Status post CAB ?  ?EXAM: ?CHEST - 2 VIEW ?  ?COMPARISON:  Chest radiograph 02/07/2022 ?  ?FINDINGS: ?Median sternotomy wires are again noted. The heart is enlarged, ?unchanged. The upper mediastinal contours are within normal limits. ?  ?There is a trace left pleural effusion, stable to decreased in size ?since 02/07/2022. The previously seen right effusion has resolved. ?There is no other focal airspace disease. There is no pulmonary ?edema. There is no pleural effusion or pneumothorax ?  ?There is no acute osseous abnormality. ?  ?IMPRESSION: ?Small left pleural effusion is stable to decreased in size since ?02/07/2022. No new or worsening focal airspace disease. ?  ?  ?Electronically Signed ?  By: Valetta Mole M.D. ?  On: 03/14/2022 14:35 ? ?Impression / Plan: ?Mr. Isakson continues to  make progress in the sense that he is currently after two-vessel coronary bypass grafting.  He recently presented with palpitations and was discovered to be having episodes of nonsustained V. tach.  He was well aware of most of these episodes prior to surgery but he has not had any at all since his discharge home.  As mentioned above, he has just completed long-term course of cardiac monitoring with a Zio patch.  He will be seeing his cardiologist in [place] in the next week or so to discuss results. ?Medications are reviewed today, no changes are made from CT  surgery standpoint. ?Mr. Scurlock may resume driving.  He may gradually increase his activities but should continue to observe sternal precautions with no lifting greater than 15 pounds for another month. ?No further follow-up with our practice is scheduled but I assured Mr. Mcglinchey we will be very happy to see him anytime should any problems arise related to surgery. ? ? ?Antony Odea, PA-C ?[facility] ?[phone] ? ?

## 2022-03-14 ENCOUNTER — Encounter: Payer: Self-pay | Admitting: Physician Assistant

## 2022-03-14 ENCOUNTER — Ambulatory Visit (INDEPENDENT_AMBULATORY_CARE_PROVIDER_SITE_OTHER): Payer: Self-pay | Admitting: Physician Assistant

## 2022-03-14 ENCOUNTER — Ambulatory Visit
Admission: RE | Admit: 2022-03-14 | Discharge: 2022-03-14 | Disposition: A | Discharge status: Home / Self Care | Payer: PPO | Source: Ambulatory Visit | Attending: Thoracic Surgery (Cardiothoracic Vascular Surgery) | Admitting: Thoracic Surgery (Cardiothoracic Vascular Surgery)

## 2022-03-14 VITALS — BP 128/71 | HR 71 | Resp 20 | Ht 68.0 in | Wt 236.2 lb

## 2022-03-14 DIAGNOSIS — Z951 Presence of aortocoronary bypass graft: Secondary | ICD-10-CM

## 2022-03-14 DIAGNOSIS — I517 Cardiomegaly: Secondary | ICD-10-CM | POA: Diagnosis not present

## 2022-03-14 DIAGNOSIS — J90 Pleural effusion, not elsewhere classified: Secondary | ICD-10-CM | POA: Diagnosis not present

## 2022-03-14 NOTE — Patient Instructions (Signed)
No change in medications from surgery standpoint. ? ?Continue to observe sternal precautions and avoid lifting greater than 15 pounds for another 4 weeks.  After this he may gradually increase your activity as tolerated without restriction. ? ?Follow-up as needed ?

## 2022-03-15 DIAGNOSIS — I472 Ventricular tachycardia, unspecified: Secondary | ICD-10-CM | POA: Diagnosis not present

## 2022-03-15 DIAGNOSIS — Z951 Presence of aortocoronary bypass graft: Secondary | ICD-10-CM | POA: Diagnosis not present

## 2022-03-20 ENCOUNTER — Ambulatory Visit: Payer: PPO | Admitting: Cardiology

## 2022-03-20 ENCOUNTER — Telehealth (HOSPITAL_COMMUNITY): Payer: Self-pay | Admitting: *Deleted

## 2022-03-20 NOTE — Telephone Encounter (Signed)
Attempted to call patient regarding upcoming cardiac MRI appointment. Left message on voicemail with name and callback number  Honour Schwieger RN Navigator Cardiac Imaging [facility] [phone] Office [phone] Cell  

## 2022-03-21 ENCOUNTER — Ambulatory Visit (HOSPITAL_COMMUNITY)
Admission: RE | Admit: 2022-03-21 | Discharge: 2022-03-21 | Disposition: A | Discharge status: Home / Self Care | Payer: PPO | Source: Ambulatory Visit | Attending: Cardiology | Admitting: Cardiology

## 2022-03-21 DIAGNOSIS — I472 Ventricular tachycardia, unspecified: Secondary | ICD-10-CM | POA: Insufficient documentation

## 2022-03-21 MED ORDER — GADOBUTROL 1 MMOL/ML IV SOLN
10.0000 mL | Freq: Once | INTRAVENOUS | Status: AC | PRN
  Administered 2022-03-21: 10 mL via INTRAVENOUS

## 2022-03-25 ENCOUNTER — Telehealth: Payer: Self-pay | Admitting: Cardiology

## 2022-03-25 NOTE — Telephone Encounter (Signed)
30 day refill sent on 01/30/22 with 6 refills.  ?Receipt confirmed by pharmacy (01/30/2022 11:10 AM EDT) ?

## 2022-03-25 NOTE — Telephone Encounter (Signed)
Called patients pharmacy and was told that the insurance was basing the decision not able to refill on patients previous dose prior to the dose increase on 01/30/22. The pharmacist stated that she will call the insurance provider and get this fixed and will then call the patient to let him know his medication is ready for pick up. I called patients wife back and left a detailed VM per DPR on file with this information and encouraged her to call back if she had any further questions. ?

## 2022-03-25 NOTE — Telephone Encounter (Signed)
?*  STAT* If patient is at the pharmacy, call can be transferred to refill team. ? ? ?1. Which medications need to be refilled? (please list name of each medication and dose if known) metoprolol succinate (TOPROL-XL) 50 MG 24 hr tablet ? ?2. Which pharmacy/location (including street and city if local pharmacy) is medication to be sent to? [place], [place] ? ?3. Do they need a 30 day or 90 day supply?  ?30 day ? ?

## 2022-03-25 NOTE — Telephone Encounter (Signed)
Pt c/o medication issue: ? ?1. Name of Medication:  ? metoprolol succinate (TOPROL-XL) 50 MG 24 hr tablet  ? ? ?2. How are you currently taking this medication (dosage and times per day)? Take 1 tablet (50 mg total) by mouth in the morning and at bedtime. Take with or immediately following a meal. ? ? ?3. Are you having a reaction (difficulty breathing--STAT)? No ? ?4. What is your medication issue?  Pt's wife states that insurance will not pay for medication until 04/06/22. She would like to know if pt is ok to go without until then. Please advise ? ? ?

## 2022-04-03 ENCOUNTER — Inpatient Hospital Stay: Payer: PPO | Attending: Oncology

## 2022-04-03 ENCOUNTER — Inpatient Hospital Stay: Payer: PPO

## 2022-04-03 ENCOUNTER — Other Ambulatory Visit: Payer: Self-pay | Admitting: *Deleted

## 2022-04-03 DIAGNOSIS — Z79899 Other long term (current) drug therapy: Secondary | ICD-10-CM | POA: Insufficient documentation

## 2022-04-03 DIAGNOSIS — D751 Secondary polycythemia: Secondary | ICD-10-CM

## 2022-04-03 DIAGNOSIS — E611 Iron deficiency: Secondary | ICD-10-CM | POA: Insufficient documentation

## 2022-04-03 LAB — CBC WITH DIFFERENTIAL/PLATELET
Abs Immature Granulocytes: 0.03 10*3/uL (ref 0.00–0.07)
Basophils Absolute: 0.1 10*3/uL (ref 0.0–0.1)
Basophils Relative: 1 %
Eosinophils Absolute: 0.5 10*3/uL (ref 0.0–0.5)
Eosinophils Relative: 5 %
HCT: 46.3 % (ref 39.0–52.0)
Hemoglobin: 14.4 g/dL (ref 13.0–17.0)
Immature Granulocytes: 0 %
Lymphocytes Relative: 20 %
Lymphs Abs: 1.9 10*3/uL (ref 0.7–4.0)
MCH: 25.7 pg — ABNORMAL LOW (ref 26.0–34.0)
MCHC: 31.1 g/dL (ref 30.0–36.0)
MCV: 82.7 fL (ref 80.0–100.0)
Monocytes Absolute: 0.8 10*3/uL (ref 0.1–1.0)
Monocytes Relative: 8 %
Neutro Abs: 6.5 10*3/uL (ref 1.7–7.7)
Neutrophils Relative %: 66 %
Platelets: 295 10*3/uL (ref 150–400)
RBC: 5.6 MIL/uL (ref 4.22–5.81)
RDW: 15.5 % (ref 11.5–15.5)
WBC: 9.8 10*3/uL (ref 4.0–10.5)
nRBC: 0 % (ref 0.0–0.2)

## 2022-04-03 LAB — IRON AND TIBC
Iron: 25 ug/dL — ABNORMAL LOW (ref 45–182)
Saturation Ratios: 6 % — ABNORMAL LOW (ref 17.9–39.5)
TIBC: 416 ug/dL (ref 250–450)
UIBC: 391 ug/dL

## 2022-04-03 LAB — FERRITIN: Ferritin: 11 ng/mL — ABNORMAL LOW (ref 24–336)

## 2022-04-03 NOTE — Progress Notes (Signed)
Lab orders

## 2022-04-03 NOTE — Progress Notes (Signed)
Hemoglobin less than 17 (14.4). No phlebotomy required per parameters. Pt reports he had  heart By Pass surgery 6 weeks ago. States he is doing well. Informed of hemoglobin result. ?

## 2022-04-04 ENCOUNTER — Ambulatory Visit: Payer: PPO | Admitting: Cardiology

## 2022-04-04 ENCOUNTER — Encounter: Payer: Self-pay | Admitting: Cardiology

## 2022-04-04 VITALS — BP 122/62 | HR 80 | Ht 68.0 in | Wt 239.0 lb

## 2022-04-04 DIAGNOSIS — I472 Ventricular tachycardia, unspecified: Secondary | ICD-10-CM | POA: Diagnosis not present

## 2022-04-04 DIAGNOSIS — Z951 Presence of aortocoronary bypass graft: Secondary | ICD-10-CM

## 2022-04-04 DIAGNOSIS — I10 Essential (primary) hypertension: Secondary | ICD-10-CM

## 2022-04-04 DIAGNOSIS — E782 Mixed hyperlipidemia: Secondary | ICD-10-CM

## 2022-04-04 MED ORDER — METOPROLOL SUCCINATE ER 50 MG PO TB24: 50.0000 mg | ORAL_TABLET | Freq: Every day | ORAL | 1 refills | Status: DC

## 2022-04-04 NOTE — Patient Instructions (Signed)
Medication Instructions:   Your physician has recommended you make the following change in your medication:    DECREASE your Metoprolol Succinate (Toprol XL) to 50 MG  once a day.  *If you need a refill on your cardiac medications before your next appointment, please call your pharmacy*   Lab Work:  None ordered  If you have labs (blood work) drawn today and your tests are completely normal, you will receive your results only by: [place] (if you have MyChart) OR A paper copy in the mail If you have any lab test that is abnormal or we need to change your treatment, we will call you to review the results.   Testing/Procedures:  None ordered   Follow-Up: At [facility], you and your health needs are our priority.  As part of our continuing mission to provide you with exceptional heart care, we have created designated Provider Care Teams.  These Care Teams include your primary Cardiologist (physician) and Advanced Practice Providers (APPs -  Physician Assistants and Nurse Practitioners) who all work together to provide you with the care you need, when you need it.  We recommend signing up for the patient portal called "MyChart".  Sign up information is provided on this After Visit Summary.  MyChart is used to connect with patients for Virtual Visits (Telemedicine).  Patients are able to view lab/test results, encounter notes, upcoming appointments, etc.  Non-urgent messages can be sent to your provider as well.   To learn more about what you can do with MyChart, go to [email or web address].    Your next appointment:   6 month(s)  The format for your next appointment:   In Person  Provider:   [name], MD    Other Instructions   Important Information About [place]
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
## 2022-04-07 ENCOUNTER — Other Ambulatory Visit: Payer: Self-pay | Admitting: Surgical

## 2022-04-12 ENCOUNTER — Other Ambulatory Visit: Payer: Self-pay

## 2022-04-12 ENCOUNTER — Telehealth: Payer: Self-pay | Admitting: Cardiology

## 2022-04-12 ENCOUNTER — Other Ambulatory Visit: Payer: Self-pay | Admitting: Surgical

## 2022-04-12 MED ORDER — ATORVASTATIN CALCIUM 80 MG PO TABS: 80.0000 mg | ORAL_TABLET | Freq: Every day | ORAL | 1 refills | Status: DC

## 2022-04-12 MED ORDER — LOSARTAN POTASSIUM 25 MG PO TABS: 25.0000 mg | ORAL_TABLET | Freq: Every day | ORAL | 1 refills | Status: DC

## 2022-04-12 NOTE — Telephone Encounter (Signed)
*  STAT* If patient is at the pharmacy, call can be transferred to refill team.   1. Which medications need to be refilled? (please list name of each medication and dose if known)  losartan (COZAAR) 25 MG tablet atorvastatin (LIPITOR) 80 MG tablet   2. Which pharmacy/location (including street and city if local pharmacy) is medication to be sent to? [place], [place]  3. Do they need a 30 day or 90 day supply? 90 day  Patient is completely out of medication.

## 2022-04-12 NOTE — Telephone Encounter (Signed)
Refills sent to pharmacy requested.

## 2022-04-25 DIAGNOSIS — I10 Essential (primary) hypertension: Secondary | ICD-10-CM | POA: Diagnosis not present

## 2022-04-25 DIAGNOSIS — I2510 Atherosclerotic heart disease of native coronary artery without angina pectoris: Secondary | ICD-10-CM | POA: Diagnosis not present

## 2022-04-25 DIAGNOSIS — N1831 Chronic kidney disease, stage 3a: Secondary | ICD-10-CM | POA: Diagnosis not present

## 2022-04-25 DIAGNOSIS — R7303 Prediabetes: Secondary | ICD-10-CM | POA: Diagnosis not present

## 2022-05-01 ENCOUNTER — Ambulatory Visit: Payer: PPO | Admitting: Cardiology

## 2022-05-01 ENCOUNTER — Encounter: Payer: Self-pay | Admitting: Cardiology

## 2022-05-01 VITALS — BP 146/76 | HR 88 | Ht 68.0 in | Wt 234.0 lb

## 2022-05-01 DIAGNOSIS — F32A Depression, unspecified: Secondary | ICD-10-CM

## 2022-05-01 DIAGNOSIS — Z951 Presence of aortocoronary bypass graft: Secondary | ICD-10-CM | POA: Diagnosis not present

## 2022-05-01 DIAGNOSIS — I472 Ventricular tachycardia, unspecified: Secondary | ICD-10-CM | POA: Diagnosis not present
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
## 2022-05-01 NOTE — Patient Instructions (Signed)
Medications: Your physician recommends that you continue on your current medications as directed. Please refer to the Current Medication list given to you today. *If you need a refill on your cardiac medications before your next appointment, please call your pharmacy*  Lab Work: None. If you have labs (blood work) drawn today and your tests are completely normal, you will receive your results only by: [place] (if you have MyChart) OR A paper copy in the mail If you have any lab test that is abnormal or we need to change your treatment, we will call you to review the results.  Testing/Procedures: None.  Follow-Up: At [facility], you and your health needs are our priority.  As part of our continuing mission to provide you with exceptional heart care, we have created designated Provider Care Teams.  These Care Teams include your primary Cardiologist (physician) and Advanced Practice Providers (APPs -  Physician Assistants and Nurse Practitioners) who all work together to provide you with the care you need, when you need it.  Your physician wants you to follow-up in: As needed with Lars Mage, MD   We recommend signing up for the patient portal called "MyChart".  Sign up information is provided on this After Visit Summary.  MyChart is used to connect with patients for Virtual Visits (Telemedicine).  Patients are able to view lab/test results, encounter notes, upcoming appointments, etc.  Non-urgent messages can be sent to your provider as well.   To learn more about what you can do with MyChart, go to [email or web address].    Any Other Special Instructions Will Be Listed Below (If Applicable).

## 2022-05-02 ENCOUNTER — Encounter: Payer: Self-pay | Admitting: Oncology

## 2022-05-29 ENCOUNTER — Ambulatory Visit: Payer: PPO | Admitting: Psychologist

## 2022-06-14 DIAGNOSIS — Z79899 Other long term (current) drug therapy: Secondary | ICD-10-CM | POA: Diagnosis not present

## 2022-06-14 DIAGNOSIS — Z125 Encounter for screening for malignant neoplasm of prostate: Secondary | ICD-10-CM | POA: Diagnosis not present

## 2022-06-14 DIAGNOSIS — I129 Hypertensive chronic kidney disease with stage 1 through stage 4 chronic kidney disease, or unspecified chronic kidney disease: Secondary | ICD-10-CM | POA: Diagnosis not present

## 2022-06-14 DIAGNOSIS — N1831 Chronic kidney disease, stage 3a: Secondary | ICD-10-CM | POA: Diagnosis not present

## 2022-06-14 DIAGNOSIS — E291 Testicular hypofunction: Secondary | ICD-10-CM | POA: Diagnosis not present

## 2022-06-14 DIAGNOSIS — E039 Hypothyroidism, unspecified: Secondary | ICD-10-CM | POA: Diagnosis not present

## 2022-06-14 DIAGNOSIS — E1122 Type 2 diabetes mellitus with diabetic chronic kidney disease: Secondary | ICD-10-CM | POA: Diagnosis not present

## 2022-06-21 DIAGNOSIS — E291 Testicular hypofunction: Secondary | ICD-10-CM | POA: Diagnosis not present

## 2022-06-21 DIAGNOSIS — N1831 Chronic kidney disease, stage 3a: Secondary | ICD-10-CM | POA: Diagnosis not present

## 2022-06-21 DIAGNOSIS — Z79899 Other long term (current) drug therapy: Secondary | ICD-10-CM | POA: Diagnosis not present

## 2022-06-21 DIAGNOSIS — E1122 Type 2 diabetes mellitus with diabetic chronic kidney disease: Secondary | ICD-10-CM | POA: Diagnosis not present

## 2022-06-21 DIAGNOSIS — E039 Hypothyroidism, unspecified: Secondary | ICD-10-CM | POA: Diagnosis not present

## 2022-06-21 DIAGNOSIS — I10 Essential (primary) hypertension: Secondary | ICD-10-CM | POA: Diagnosis not present

## 2022-06-21 DIAGNOSIS — D45 Polycythemia vera: Secondary | ICD-10-CM | POA: Diagnosis not present

## 2022-06-21 DIAGNOSIS — E7800 Pure hypercholesterolemia, unspecified: Secondary | ICD-10-CM | POA: Diagnosis not present

## 2022-06-25 IMAGING — CR DG CHEST 2V
2 series · 2 of 2 positions shown · non-contrast
Comparison: Portable chest 04/03/2018.

CLINICAL DATA: 75-year-old male with sustained ventricular
tachycardia. Preoperative study for CABG.

EXAM:
CHEST - 2 VIEW

[chest lat]
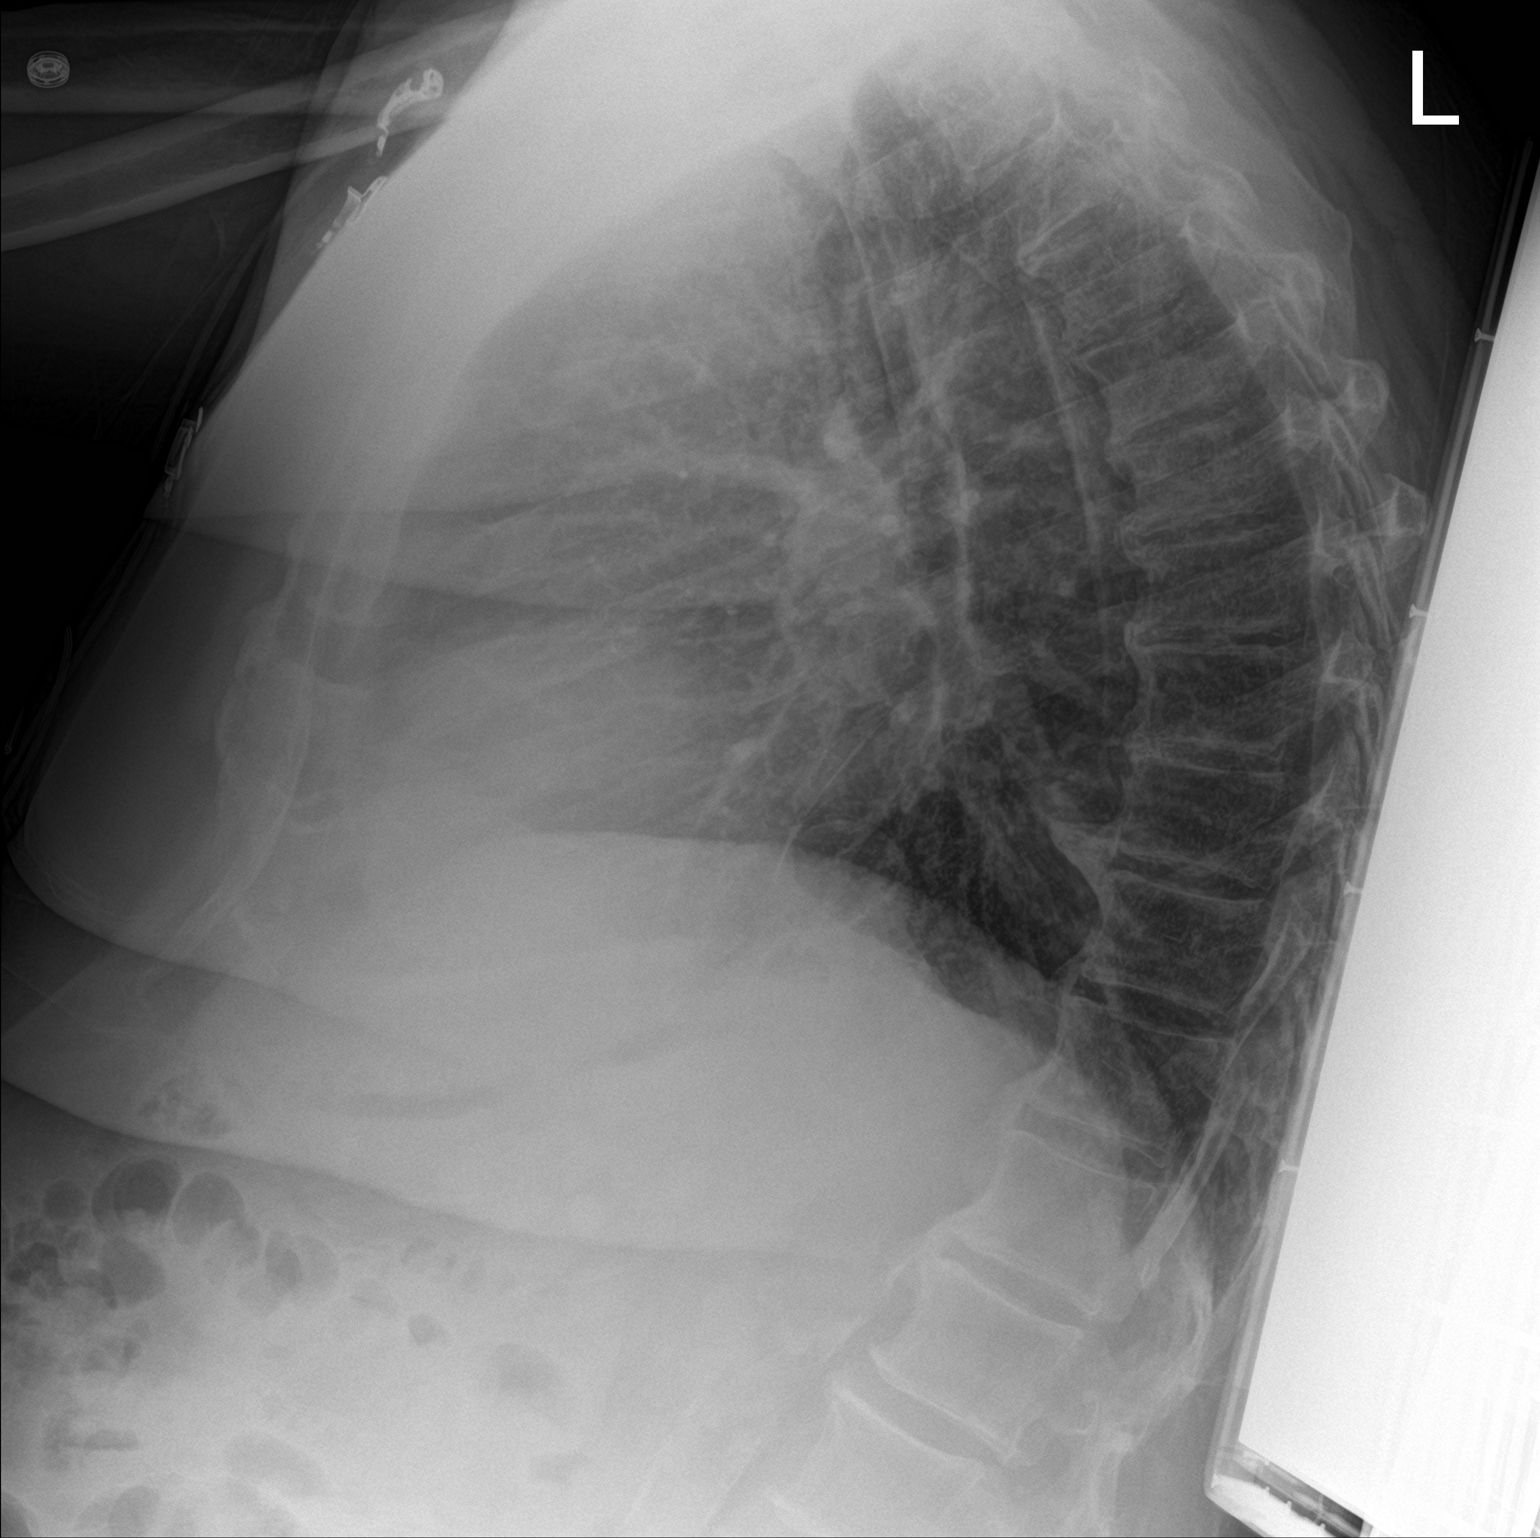

[chest ap]
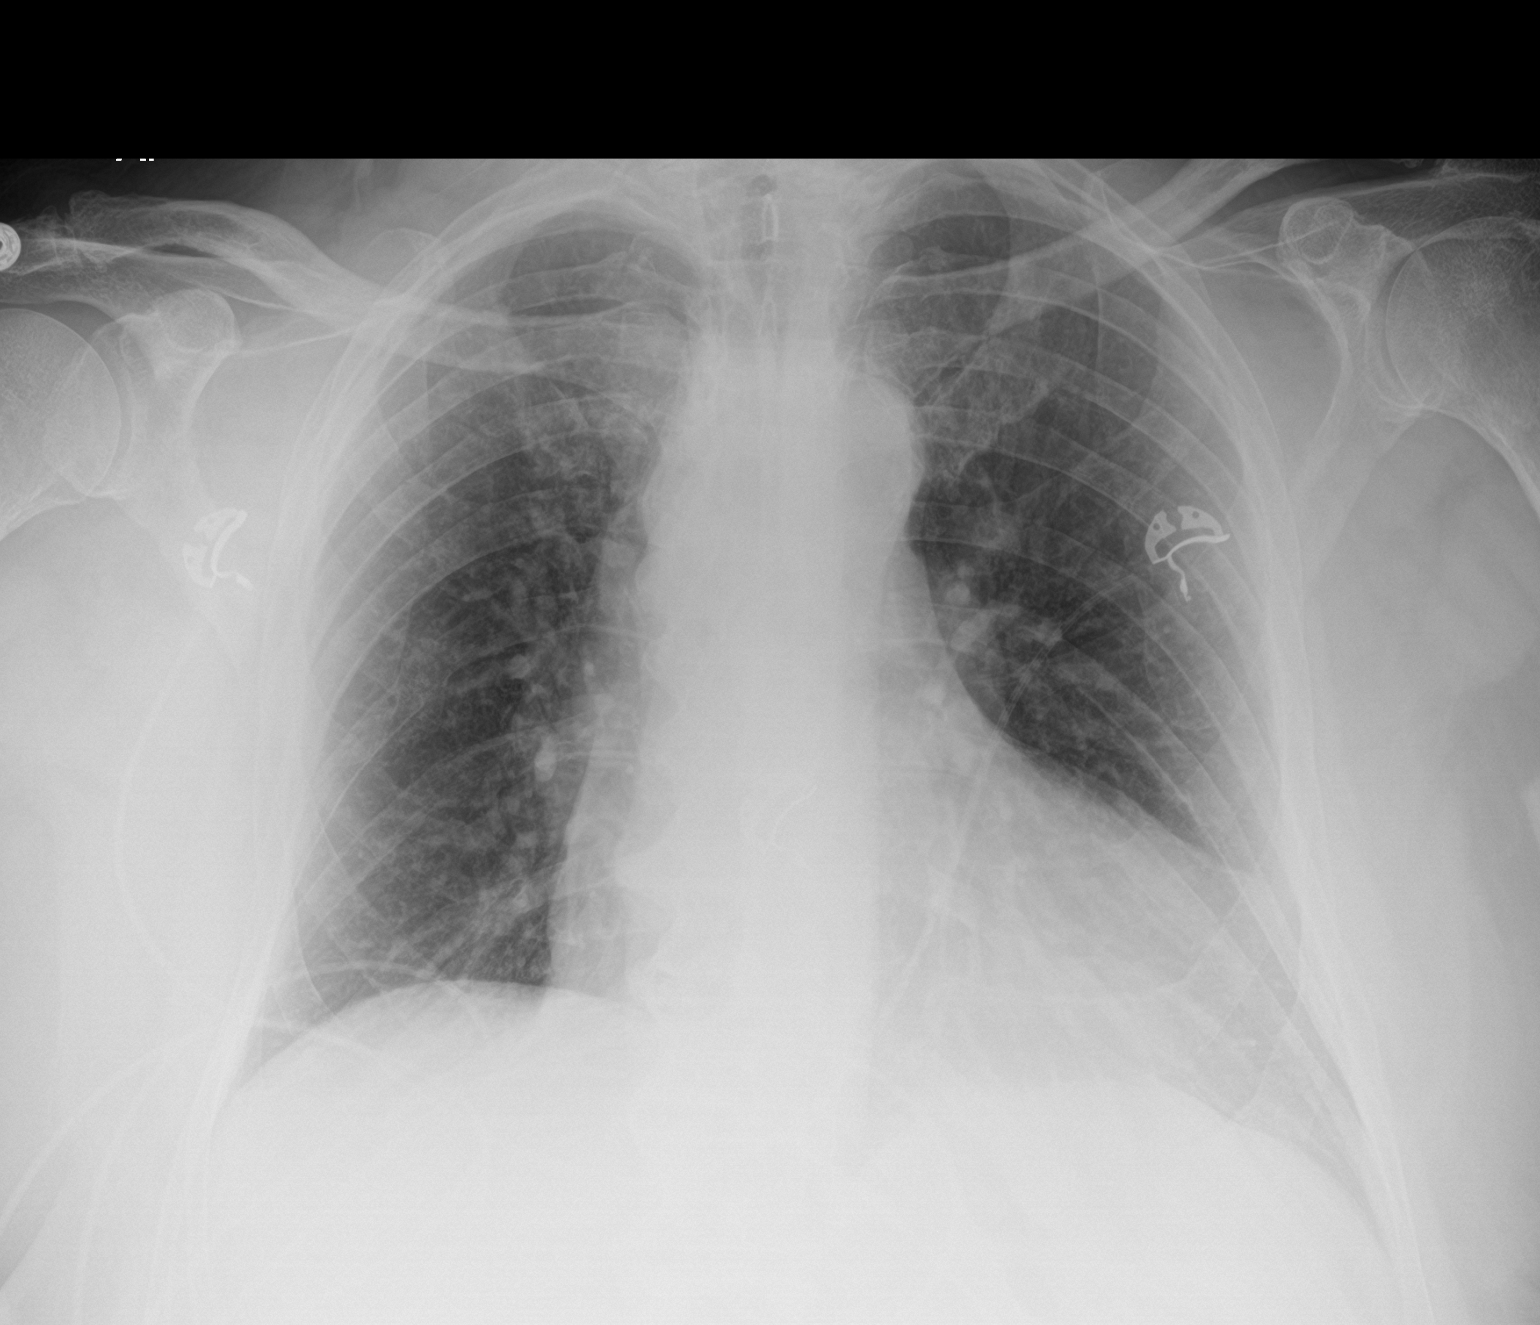

[2 of 2 positions shown; findings below may reference images not displayed]

FINDINGS: AP and lateral views of the chest. Mild cardiomegaly. Other
mediastinal contours are within normal limits. Lung volumes are
stable since 7702, mildly low. No pneumothorax, pulmonary edema,
pleural effusion or confluent pulmonary opacity.

Abdominal Calcified aortic atherosclerosis. Negative visible bowel
gas. Flowing osteophytes in the thoracic spine may result in levels
of interbody ankylosis.
IMPRESSION: 1. No acute cardiopulmonary abnormality.
2. Mild cardiomegaly.

## 2022-06-25 IMAGING — DX DG CHEST 1V PORT
1 series · 1 of 1 positions shown · non-contrast
Comparison: Same-day radiograph at 3669 hours.

CLINICAL DATA: Status post CABG x2.

EXAM:
PORTABLE CHEST 1 VIEW

[chest]
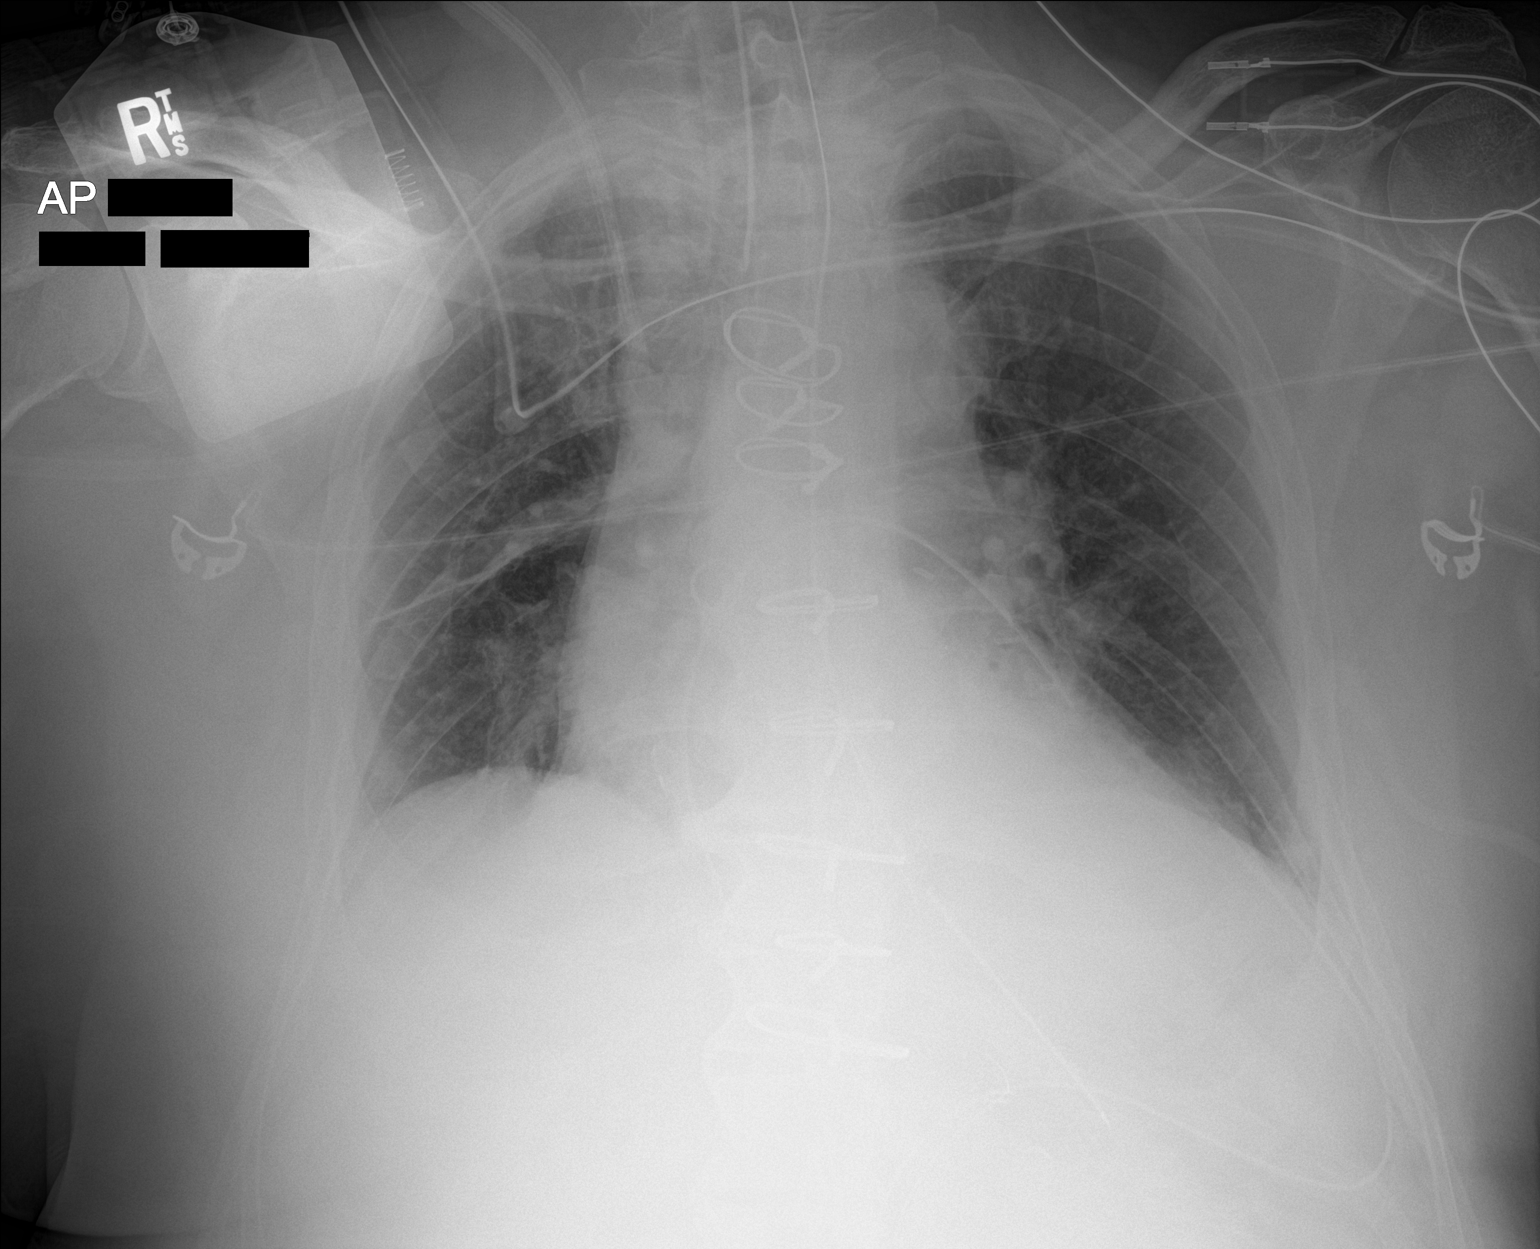

[1 of 1 positions shown; findings below may reference images not displayed]

FINDINGS: Patient is rotated with slight lordotic positioning.

Postsurgical changes of median sternotomy and CABG are present.
Right upper central venous catheter with tip overlying the SVC.
Endotracheal tube with tip at the thoracic inlet. Mediastinal and
left thoracic surgical drains.

Enlarged cardiac silhouette.

No overt pulmonary edema and no focal airspace consolidation. No
visible pleural effusion or pneumothorax.
IMPRESSION: Postsurgical changes of median sternotomy and CABG with enlarged
cardiac silhouette. No overt pulmonary edema or focal airspace
consolidation.

## 2022-06-28 IMAGING — DX DG CHEST 2V
2 series · 2 of 2 positions shown · non-contrast
Comparison: Portable chest yesterday at [DATE] a.m.

CLINICAL DATA: Sustained ventricular tachycardia.

EXAM:
CHEST - 2 VIEW

[chest pa]
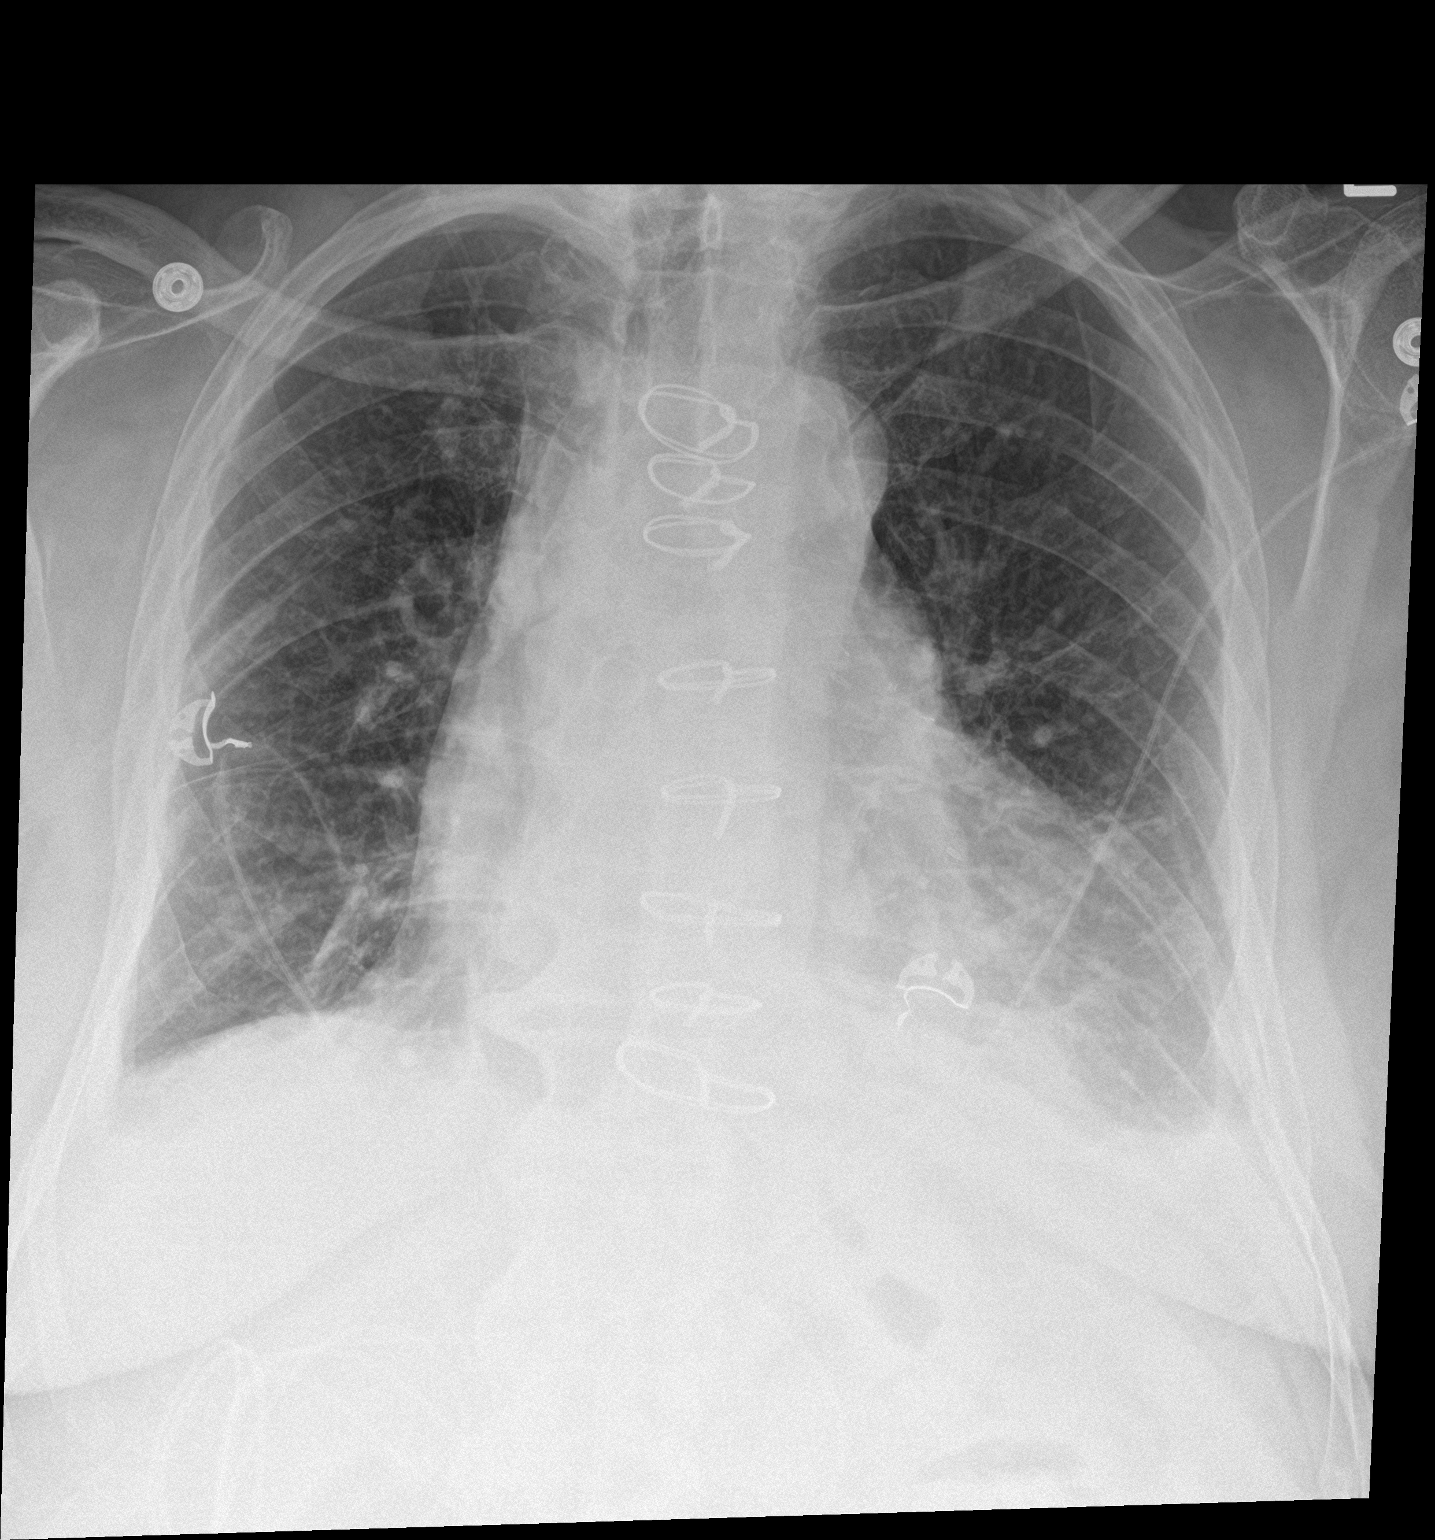

[chest lat]
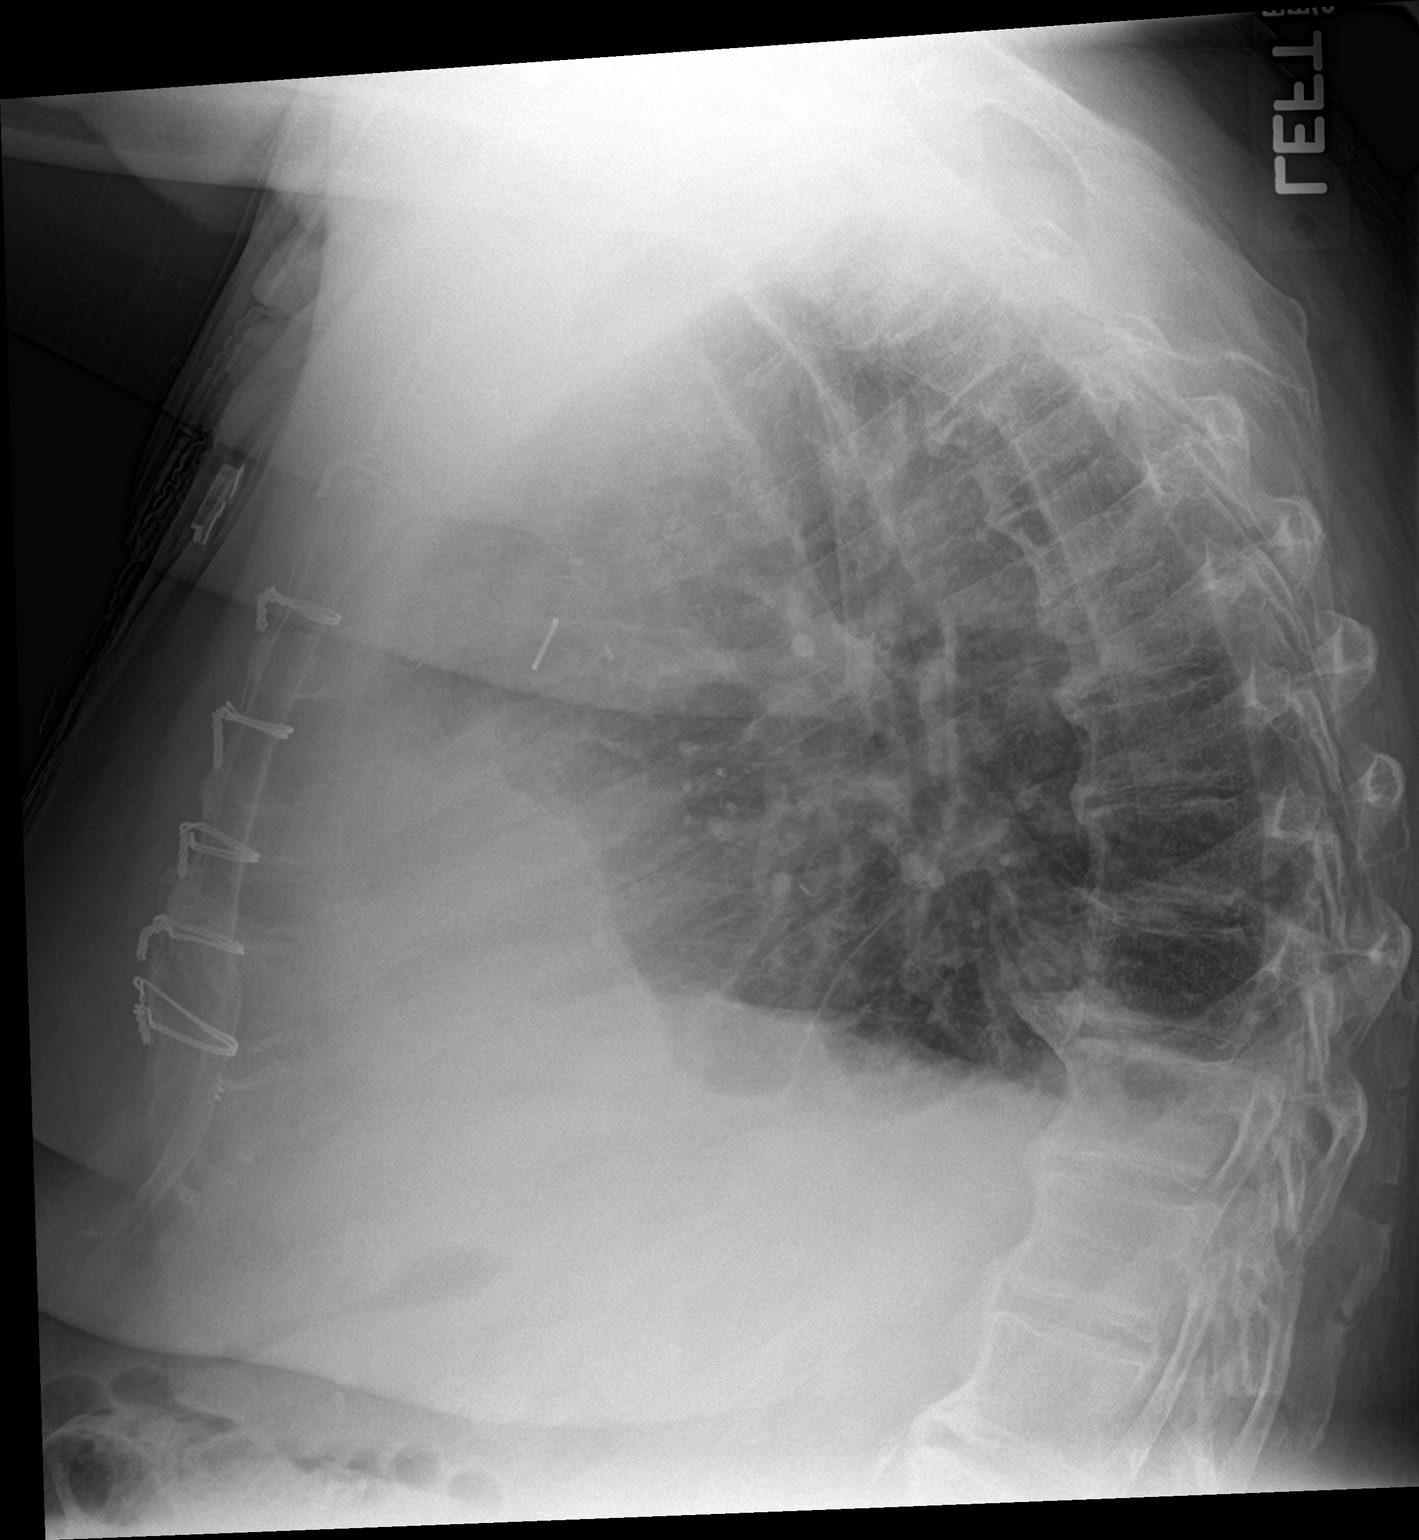

[2 of 2 positions shown; findings below may reference images not displayed]

FINDINGS: PA Lat exam is obtained  at [DATE] a.m., 02/07/2022.

Interval removal of right IJ catheter and introducer sheath.
Sternotomy sutures are intact and midline.

There is no visible pneumothorax with CABG change again shown. Small
left pleural effusion with overlying atelectasis or consolidation is
unchanged allowing for differences in technique.

The heart is enlarged. No vascular congestion is seen. There is
aortic atherosclerosis, stable mediastinum.

The remaining lungs clear. The right sulci are sharp. Overall
aeration seems unchanged.
IMPRESSION: No significant change in the overall aeration. Small left pleural
effusion continues to be seen with overlying atelectasis or
consolidation. There is cardiomegaly with CABG changes without
evidence of CHF.

## 2022-07-04 ENCOUNTER — Inpatient Hospital Stay: Payer: PPO | Attending: Oncology

## 2022-07-04 ENCOUNTER — Other Ambulatory Visit: Payer: Self-pay

## 2022-07-04 ENCOUNTER — Inpatient Hospital Stay (HOSPITAL_BASED_OUTPATIENT_CLINIC_OR_DEPARTMENT_OTHER): Payer: PPO | Admitting: Nurse Practitioner

## 2022-07-04 ENCOUNTER — Inpatient Hospital Stay: Payer: PPO

## 2022-07-04 VITALS — BP 139/67 | HR 73 | Temp 98.7°F | Resp 16 | Wt 243.0 lb

## 2022-07-04 DIAGNOSIS — D751 Secondary polycythemia: Secondary | ICD-10-CM

## 2022-07-04 DIAGNOSIS — E611 Iron deficiency: Secondary | ICD-10-CM | POA: Insufficient documentation

## 2022-07-04 DIAGNOSIS — T387X5S Adverse effect of androgens and anabolic congeners, sequela: Secondary | ICD-10-CM

## 2022-07-04 LAB — CBC WITH DIFFERENTIAL/PLATELET
Abs Immature Granulocytes: 0.04 10*3/uL (ref 0.00–0.07)
Basophils Absolute: 0.1 10*3/uL (ref 0.0–0.1)
Basophils Relative: 1 %
Eosinophils Absolute: 0.3 10*3/uL (ref 0.0–0.5)
Eosinophils Relative: 4 %
HCT: 50.2 % (ref 39.0–52.0)
Hemoglobin: 15.9 g/dL (ref 13.0–17.0)
Immature Granulocytes: 1 %
Lymphocytes Relative: 24 %
Lymphs Abs: 2.1 10*3/uL (ref 0.7–4.0)
MCH: 26.4 pg (ref 26.0–34.0)
MCHC: 31.7 g/dL (ref 30.0–36.0)
MCV: 83.3 fL (ref 80.0–100.0)
Monocytes Absolute: 0.9 10*3/uL (ref 0.1–1.0)
Monocytes Relative: 11 %
Neutro Abs: 5.3 10*3/uL (ref 1.7–7.7)
Neutrophils Relative %: 59 %
Platelets: 230 10*3/uL (ref 150–400)
RBC: 6.03 MIL/uL — ABNORMAL HIGH (ref 4.22–5.81)
RDW: 20.5 % — ABNORMAL HIGH (ref 11.5–15.5)
WBC: 8.7 10*3/uL (ref 4.0–10.5)
nRBC: 0 % (ref 0.0–0.2)

## 2022-07-04 LAB — FERRITIN: Ferritin: 8 ng/mL — ABNORMAL LOW (ref 24–336)

## 2022-07-04 LAB — IRON AND TIBC
Iron: 39 ug/dL — ABNORMAL LOW (ref 45–182)
Saturation Ratios: 9 % — ABNORMAL LOW (ref 17.9–39.5)
TIBC: 424 ug/dL (ref 250–450)
UIBC: 385 ug/dL

## 2022-07-04 NOTE — Progress Notes (Signed)
Returns for follow-up, possible phlebotomy today. No new concerns at this time.

## 2022-07-04 NOTE — Progress Notes (Unsigned)
   Established Patient Office Visit  Subjective   Patient ID: TARYN NAVE, male    DOB: [birth year]  Age: 76 y.o. MRN: [id number]  No chief complaint on file.   HPI  {History (Optional):23778}  ROS    Objective:     There were no vitals taken for this visit. {Vitals History (Optional):23777}  Physical Exam   Results for orders placed or performed in visit on 07/04/22  CBC with Differential  Result Value Ref Range   WBC 8.7 4.0 - 10.5 K/uL   RBC 6.03 (H) 4.22 - 5.81 MIL/uL   Hemoglobin 15.9 13.0 - 17.0 g/dL   HCT 50.2 39.0 - 52.0 %   MCV 83.3 80.0 - 100.0 fL   MCH 26.4 26.0 - 34.0 pg   MCHC 31.7 30.0 - 36.0 g/dL   RDW 20.5 (H) 11.5 - 15.5 %   Platelets 230 150 - 400 K/uL   nRBC 0.0 0.0 - 0.2 %   Neutrophils Relative % 59 %   Neutro Abs 5.3 1.7 - 7.7 K/uL   Lymphocytes Relative 24 %   Lymphs Abs 2.1 0.7 - 4.0 K/uL   Monocytes Relative 11 %   Monocytes Absolute 0.9 0.1 - 1.0 K/uL   Eosinophils Relative 4 %   Eosinophils Absolute 0.3 0.0 - 0.5 K/uL   Basophils Relative 1 %   Basophils Absolute 0.1 0.0 - 0.1 K/uL   Immature Granulocytes 1 %   Abs Immature Granulocytes 0.04 0.00 - 0.07 K/uL    {Labs (Optional):23779}  The ASCVD Risk score (Arnett DK, et al., 2019) failed to calculate for the following reasons:   The valid total cholesterol range is 130 to 320 mg/dL    Assessment & Plan:   Problem List Items Addressed This Visit   None   No follow-ups on file.    Mayer Camel, RN
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
## 2022-07-17 DIAGNOSIS — H26492 Other secondary cataract, left eye: Secondary | ICD-10-CM | POA: Diagnosis not present

## 2022-07-17 DIAGNOSIS — H35372 Puckering of macula, left eye: Secondary | ICD-10-CM | POA: Diagnosis not present

## 2022-07-17 DIAGNOSIS — Z961 Presence of intraocular lens: Secondary | ICD-10-CM | POA: Diagnosis not present

## 2022-07-17 DIAGNOSIS — H52223 Regular astigmatism, bilateral: Secondary | ICD-10-CM | POA: Diagnosis not present

## 2022-07-17 DIAGNOSIS — H3561 Retinal hemorrhage, right eye: Secondary | ICD-10-CM | POA: Diagnosis not present

## 2022-07-17 DIAGNOSIS — H10233 Serous conjunctivitis, except viral, bilateral: Secondary | ICD-10-CM | POA: Diagnosis not present

## 2022-07-17 DIAGNOSIS — H35411 Lattice degeneration of retina, right eye: Secondary | ICD-10-CM | POA: Diagnosis not present

## 2022-07-17 DIAGNOSIS — H43811 Vitreous degeneration, right eye: Secondary | ICD-10-CM | POA: Diagnosis not present

## 2022-07-17 DIAGNOSIS — H5203 Hypermetropia, bilateral: Secondary | ICD-10-CM | POA: Diagnosis not present

## 2022-07-17 DIAGNOSIS — H524 Presbyopia: Secondary | ICD-10-CM | POA: Diagnosis not present

## 2022-07-29 DIAGNOSIS — B078 Other viral warts: Secondary | ICD-10-CM | POA: Diagnosis not present

## 2022-07-29 DIAGNOSIS — L298 Other pruritus: Secondary | ICD-10-CM | POA: Diagnosis not present

## 2022-07-29 DIAGNOSIS — X32XXXA Exposure to sunlight, initial encounter: Secondary | ICD-10-CM | POA: Diagnosis not present

## 2022-07-29 DIAGNOSIS — L728 Other follicular cysts of the skin and subcutaneous tissue: Secondary | ICD-10-CM | POA: Diagnosis not present

## 2022-07-29 DIAGNOSIS — L578 Other skin changes due to chronic exposure to nonionizing radiation: Secondary | ICD-10-CM | POA: Diagnosis not present

## 2022-07-29 DIAGNOSIS — L570 Actinic keratosis: Secondary | ICD-10-CM | POA: Diagnosis not present

## 2022-08-15 ENCOUNTER — Encounter: Payer: Self-pay | Admitting: Oncology

## 2022-09-16 DIAGNOSIS — H3321 Serous retinal detachment, right eye: Secondary | ICD-10-CM | POA: Diagnosis not present

## 2022-09-16 DIAGNOSIS — H26492 Other secondary cataract, left eye: Secondary | ICD-10-CM | POA: Diagnosis not present

## 2022-10-04 ENCOUNTER — Inpatient Hospital Stay: Payer: PPO

## 2022-10-04 ENCOUNTER — Inpatient Hospital Stay: Payer: PPO | Attending: Oncology

## 2022-10-04 ENCOUNTER — Other Ambulatory Visit: Payer: Self-pay

## 2022-10-04 DIAGNOSIS — D751 Secondary polycythemia: Secondary | ICD-10-CM | POA: Diagnosis not present

## 2022-10-04 DIAGNOSIS — R799 Abnormal finding of blood chemistry, unspecified: Secondary | ICD-10-CM | POA: Diagnosis not present

## 2022-10-04 LAB — CBC WITH DIFFERENTIAL/PLATELET
Abs Immature Granulocytes: 0.03 10*3/uL (ref 0.00–0.07)
Basophils Absolute: 0 10*3/uL (ref 0.0–0.1)
Basophils Relative: 0 %
Eosinophils Absolute: 0.2 10*3/uL (ref 0.0–0.5)
Eosinophils Relative: 3 %
HCT: 55.6 % — ABNORMAL HIGH (ref 39.0–52.0)
Hemoglobin: 18 g/dL — ABNORMAL HIGH (ref 13.0–17.0)
Immature Granulocytes: 0 %
Lymphocytes Relative: 25 %
Lymphs Abs: 2.2 10*3/uL (ref 0.7–4.0)
MCH: 28 pg (ref 26.0–34.0)
MCHC: 32.4 g/dL (ref 30.0–36.0)
MCV: 86.5 fL (ref 80.0–100.0)
Monocytes Absolute: 0.9 10*3/uL (ref 0.1–1.0)
Monocytes Relative: 10 %
Neutro Abs: 5.5 10*3/uL (ref 1.7–7.7)
Neutrophils Relative %: 62 %
Platelets: 227 10*3/uL (ref 150–400)
RBC: 6.43 MIL/uL — ABNORMAL HIGH (ref 4.22–5.81)
RDW: 18 % — ABNORMAL HIGH (ref 11.5–15.5)
WBC: 8.9 10*3/uL (ref 4.0–10.5)
nRBC: 0 % (ref 0.0–0.2)

## 2022-10-04 LAB — IRON AND TIBC
Iron: 64 ug/dL (ref 45–182)
Saturation Ratios: 13 % — ABNORMAL LOW (ref 17.9–39.5)
TIBC: 486 ug/dL — ABNORMAL HIGH (ref 250–450)
UIBC: 422 ug/dL

## 2022-10-04 LAB — FERRITIN: Ferritin: 13 ng/mL — ABNORMAL LOW (ref 24–336)

## 2022-10-04 NOTE — Patient Instructions (Signed)

## 2022-10-07 ENCOUNTER — Ambulatory Visit: Payer: PPO | Attending: Cardiology | Admitting: Cardiology

## 2022-10-07 ENCOUNTER — Encounter: Payer: Self-pay | Admitting: Cardiology

## 2022-10-07 VITALS — BP 132/78 | HR 79 | Ht 68.0 in | Wt 242.0 lb

## 2022-10-07 DIAGNOSIS — Z951 Presence of aortocoronary bypass graft: Secondary | ICD-10-CM | POA: Diagnosis not present

## 2022-10-07 DIAGNOSIS — H35372 Puckering of macula, left eye: Secondary | ICD-10-CM | POA: Diagnosis not present

## 2022-10-07 DIAGNOSIS — H35411 Lattice degeneration of retina, right eye: Secondary | ICD-10-CM | POA: Diagnosis not present

## 2022-10-07 DIAGNOSIS — E782 Mixed hyperlipidemia: Secondary | ICD-10-CM | POA: Diagnosis not present

## 2022-10-07 DIAGNOSIS — H524 Presbyopia: Secondary | ICD-10-CM | POA: Diagnosis not present

## 2022-10-07 DIAGNOSIS — H43811 Vitreous degeneration, right eye: Secondary | ICD-10-CM | POA: Diagnosis not present

## 2022-10-07 DIAGNOSIS — H5203 Hypermetropia, bilateral: Secondary | ICD-10-CM | POA: Diagnosis not present

## 2022-10-07 DIAGNOSIS — H338 Other retinal detachments: Secondary | ICD-10-CM | POA: Diagnosis not present

## 2022-10-07 DIAGNOSIS — I10 Essential (primary) hypertension: Secondary | ICD-10-CM

## 2022-10-07 DIAGNOSIS — Z961 Presence of intraocular lens: Secondary | ICD-10-CM | POA: Diagnosis not present

## 2022-10-07 DIAGNOSIS — H3561 Retinal hemorrhage, right eye: Secondary | ICD-10-CM | POA: Diagnosis not present

## 2022-10-07 DIAGNOSIS — H52223 Regular astigmatism, bilateral: Secondary | ICD-10-CM | POA: Diagnosis not present

## 2022-10-07 DIAGNOSIS — H26492 Other secondary cataract, left eye: Secondary | ICD-10-CM | POA: Diagnosis not present

## 2022-10-07 NOTE — Patient Instructions (Signed)
Medication Instructions:   Your physician recommends that you continue on your current medications as directed. Please refer to the Current Medication list given to you today.  *If you need a refill on your cardiac medications before your next appointment, please call your pharmacy*   Lab Work:  None ordered  If you have labs (blood work) drawn today and your tests are completely normal, you will receive your results only by: [place] (if you have MyChart) OR A paper copy in the mail If you have any lab test that is abnormal or we need to change your treatment, we will call you to review the results.   Testing/Procedures:  None Ordered   Follow-Up: At [facility], you and your health needs are our priority.  As part of our continuing mission to provide you with exceptional heart care, we have created designated Provider Care Teams.  These Care Teams include your primary Cardiologist (physician) and Advanced Practice Providers (APPs -  Physician Assistants and Nurse Practitioners) who all work together to provide you with the care you need, when you need it.  We recommend signing up for the patient portal called "MyChart".  Sign up information is provided on this After Visit Summary.  MyChart is used to connect with patients for Virtual Visits (Telemedicine).  Patients are able to view lab/test results, encounter notes, upcoming appointments, etc.  Non-urgent messages can be sent to your provider as well.   To learn more about what you can do with MyChart, go to [email or web address].    Your next appointment:   6 -12  month(s)  The format for your next appointment:   In Person  Provider:   You may see [name], MD or one of the following Advanced Practice Providers on your designated Care Team:   Murray Hodgkins, NP Christell Faith, PA-C Cadence Kathlen Mody, PA-C Gerrie Nordmann, NP
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
## 2022-10-08 NOTE — Addendum Note (Signed)
Addended by: Michel Santee on: 10/08/2022 01:24 PM   Modules accepted: Orders

## 2022-10-14 ENCOUNTER — Other Ambulatory Visit: Payer: Self-pay | Admitting: Cardiology

## 2022-10-14 ENCOUNTER — Other Ambulatory Visit: Payer: Self-pay

## 2022-10-14 ENCOUNTER — Encounter: Payer: Self-pay | Admitting: Cardiology

## 2022-10-14 MED ORDER — ATORVASTATIN CALCIUM 80 MG PO TABS: 80.0000 mg | ORAL_TABLET | Freq: Every day | ORAL | 3 refills | Status: DC

## 2022-10-14 MED ORDER — LOSARTAN POTASSIUM 25 MG PO TABS: 25.0000 mg | ORAL_TABLET | Freq: Every day | ORAL | 1 refills | Status: DC

## 2022-11-25 DIAGNOSIS — H3321 Serous retinal detachment, right eye: Secondary | ICD-10-CM | POA: Diagnosis not present

## 2022-12-21 DIAGNOSIS — U071 COVID-19: Secondary | ICD-10-CM | POA: Diagnosis not present

## 2022-12-21 DIAGNOSIS — Z03818 Encounter for observation for suspected exposure to other biological agents ruled out: Secondary | ICD-10-CM | POA: Diagnosis not present

## 2023-01-03 ENCOUNTER — Other Ambulatory Visit: Payer: Self-pay

## 2023-01-03 DIAGNOSIS — D751 Secondary polycythemia: Secondary | ICD-10-CM

## 2023-01-06 ENCOUNTER — Inpatient Hospital Stay: Payer: PPO

## 2023-01-06 ENCOUNTER — Encounter: Payer: Self-pay | Admitting: Nurse Practitioner

## 2023-01-06 ENCOUNTER — Inpatient Hospital Stay: Payer: PPO | Attending: Oncology | Admitting: Nurse Practitioner

## 2023-01-06 VITALS — BP 144/77 | HR 83 | Temp 96.3°F | Resp 18 | Wt 241.3 lb

## 2023-01-06 DIAGNOSIS — T387X5S Adverse effect of androgens and anabolic congeners, sequela: Secondary | ICD-10-CM

## 2023-01-06 DIAGNOSIS — Z79890 Hormone replacement therapy (postmenopausal): Secondary | ICD-10-CM | POA: Insufficient documentation

## 2023-01-06 DIAGNOSIS — D751 Secondary polycythemia: Secondary | ICD-10-CM | POA: Diagnosis not present

## 2023-01-06 DIAGNOSIS — I10 Essential (primary) hypertension: Secondary | ICD-10-CM | POA: Diagnosis not present

## 2023-01-06 DIAGNOSIS — E611 Iron deficiency: Secondary | ICD-10-CM | POA: Diagnosis not present

## 2023-01-06 DIAGNOSIS — R5383 Other fatigue: Secondary | ICD-10-CM | POA: Insufficient documentation

## 2023-01-06 DIAGNOSIS — R531 Weakness: Secondary | ICD-10-CM | POA: Insufficient documentation

## 2023-01-06 LAB — CBC WITH DIFFERENTIAL/PLATELET
Abs Immature Granulocytes: 0.03 10*3/uL (ref 0.00–0.07)
Basophils Absolute: 0.1 10*3/uL (ref 0.0–0.1)
Basophils Relative: 1 %
Eosinophils Absolute: 0.2 10*3/uL (ref 0.0–0.5)
Eosinophils Relative: 3 %
HCT: 59.4 % — ABNORMAL HIGH (ref 39.0–52.0)
Hemoglobin: 19 g/dL — ABNORMAL HIGH (ref 13.0–17.0)
Immature Granulocytes: 0 %
Lymphocytes Relative: 26 %
Lymphs Abs: 2.4 10*3/uL (ref 0.7–4.0)
MCH: 29.1 pg (ref 26.0–34.0)
MCHC: 32 g/dL (ref 30.0–36.0)
MCV: 91 fL (ref 80.0–100.0)
Monocytes Absolute: 0.9 10*3/uL (ref 0.1–1.0)
Monocytes Relative: 10 %
Neutro Abs: 5.4 10*3/uL (ref 1.7–7.7)
Neutrophils Relative %: 60 %
Platelets: 235 10*3/uL (ref 150–400)
RBC: 6.53 MIL/uL — ABNORMAL HIGH (ref 4.22–5.81)
RDW: 17.2 % — ABNORMAL HIGH (ref 11.5–15.5)
WBC: 9 10*3/uL (ref 4.0–10.5)
nRBC: 0 % (ref 0.0–0.2)

## 2023-01-06 LAB — IRON AND TIBC
Iron: 136 ug/dL (ref 45–182)
Saturation Ratios: 31 % (ref 17.9–39.5)
TIBC: 438 ug/dL (ref 250–450)
UIBC: 302 ug/dL

## 2023-01-06 LAB — FERRITIN: Ferritin: 23 ng/mL — ABNORMAL LOW (ref 24–336)
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
## 2023-01-06 NOTE — Patient Instructions (Signed)

## 2023-01-28 DIAGNOSIS — D2262 Melanocytic nevi of left upper limb, including shoulder: Secondary | ICD-10-CM | POA: Diagnosis not present

## 2023-01-28 DIAGNOSIS — D0421 Carcinoma in situ of skin of right ear and external auricular canal: Secondary | ICD-10-CM | POA: Diagnosis not present

## 2023-01-28 DIAGNOSIS — D2261 Melanocytic nevi of right upper limb, including shoulder: Secondary | ICD-10-CM | POA: Diagnosis not present

## 2023-01-28 DIAGNOSIS — Z85828 Personal history of other malignant neoplasm of skin: Secondary | ICD-10-CM | POA: Diagnosis not present

## 2023-01-28 DIAGNOSIS — D225 Melanocytic nevi of trunk: Secondary | ICD-10-CM | POA: Diagnosis not present

## 2023-01-28 DIAGNOSIS — D485 Neoplasm of uncertain behavior of skin: Secondary | ICD-10-CM | POA: Diagnosis not present

## 2023-01-28 DIAGNOSIS — D2272 Melanocytic nevi of left lower limb, including hip: Secondary | ICD-10-CM | POA: Diagnosis not present

## 2023-01-28 DIAGNOSIS — L821 Other seborrheic keratosis: Secondary | ICD-10-CM | POA: Diagnosis not present

## 2023-01-28 DIAGNOSIS — L570 Actinic keratosis: Secondary | ICD-10-CM | POA: Diagnosis not present

## 2023-01-28 DIAGNOSIS — D3611 Benign neoplasm of peripheral nerves and autonomic nervous system of face, head, and neck: Secondary | ICD-10-CM | POA: Diagnosis not present

## 2023-01-28 DIAGNOSIS — X32XXXA Exposure to sunlight, initial encounter: Secondary | ICD-10-CM | POA: Diagnosis not present

## 2023-02-03 DIAGNOSIS — D0421 Carcinoma in situ of skin of right ear and external auricular canal: Secondary | ICD-10-CM | POA: Diagnosis not present

## 2023-02-17 DIAGNOSIS — G43109 Migraine with aura, not intractable, without status migrainosus: Secondary | ICD-10-CM | POA: Diagnosis not present

## 2023-02-17 DIAGNOSIS — H43813 Vitreous degeneration, bilateral: Secondary | ICD-10-CM | POA: Diagnosis not present

## 2023-02-17 DIAGNOSIS — H3321 Serous retinal detachment, right eye: Secondary | ICD-10-CM | POA: Diagnosis not present

## 2023-02-28 ENCOUNTER — Other Ambulatory Visit: Payer: Self-pay | Admitting: Cardiology

## 2023-03-07 ENCOUNTER — Inpatient Hospital Stay: Payer: PPO | Attending: Oncology

## 2023-03-07 ENCOUNTER — Other Ambulatory Visit: Payer: Self-pay | Admitting: *Deleted

## 2023-03-07 ENCOUNTER — Inpatient Hospital Stay: Payer: PPO

## 2023-03-07 VITALS — BP 156/87 | HR 71 | Resp 20

## 2023-03-07 DIAGNOSIS — D751 Secondary polycythemia: Secondary | ICD-10-CM

## 2023-03-07 LAB — CBC WITH DIFFERENTIAL (CANCER CENTER ONLY)
Abs Immature Granulocytes: 0.03 10*3/uL (ref 0.00–0.07)
Basophils Absolute: 0.1 10*3/uL (ref 0.0–0.1)
Basophils Relative: 1 %
Eosinophils Absolute: 0.3 10*3/uL (ref 0.0–0.5)
Eosinophils Relative: 3 %
HCT: 55.6 % — ABNORMAL HIGH (ref 39.0–52.0)
Hemoglobin: 18.2 g/dL — ABNORMAL HIGH (ref 13.0–17.0)
Immature Granulocytes: 0 %
Lymphocytes Relative: 20 %
Lymphs Abs: 1.7 10*3/uL (ref 0.7–4.0)
MCH: 29.5 pg (ref 26.0–34.0)
MCHC: 32.7 g/dL (ref 30.0–36.0)
MCV: 90.1 fL (ref 80.0–100.0)
Monocytes Absolute: 0.9 10*3/uL (ref 0.1–1.0)
Monocytes Relative: 11 %
Neutro Abs: 5.4 10*3/uL (ref 1.7–7.7)
Neutrophils Relative %: 65 %
Platelet Count: 216 10*3/uL (ref 150–400)
RBC: 6.17 MIL/uL — ABNORMAL HIGH (ref 4.22–5.81)
RDW: 15.9 % — ABNORMAL HIGH (ref 11.5–15.5)
WBC Count: 8.4 10*3/uL (ref 4.0–10.5)
nRBC: 0 % (ref 0.0–0.2)

## 2023-03-07 NOTE — Patient Instructions (Signed)

## 2023-03-11 DIAGNOSIS — Z79899 Other long term (current) drug therapy: Secondary | ICD-10-CM | POA: Diagnosis not present

## 2023-03-11 DIAGNOSIS — E291 Testicular hypofunction: Secondary | ICD-10-CM | POA: Diagnosis not present

## 2023-03-11 DIAGNOSIS — E039 Hypothyroidism, unspecified: Secondary | ICD-10-CM | POA: Diagnosis not present

## 2023-03-11 DIAGNOSIS — E1122 Type 2 diabetes mellitus with diabetic chronic kidney disease: Secondary | ICD-10-CM | POA: Diagnosis not present

## 2023-03-11 DIAGNOSIS — E7800 Pure hypercholesterolemia, unspecified: Secondary | ICD-10-CM | POA: Diagnosis not present

## 2023-03-11 DIAGNOSIS — N1831 Chronic kidney disease, stage 3a: Secondary | ICD-10-CM | POA: Diagnosis not present

## 2023-03-18 DIAGNOSIS — Z0000 Encounter for general adult medical examination without abnormal findings: Secondary | ICD-10-CM | POA: Diagnosis not present

## 2023-03-18 DIAGNOSIS — N1831 Chronic kidney disease, stage 3a: Secondary | ICD-10-CM | POA: Diagnosis not present

## 2023-03-18 DIAGNOSIS — E1122 Type 2 diabetes mellitus with diabetic chronic kidney disease: Secondary | ICD-10-CM | POA: Diagnosis not present

## 2023-03-18 DIAGNOSIS — D45 Polycythemia vera: Secondary | ICD-10-CM | POA: Diagnosis not present

## 2023-03-18 DIAGNOSIS — I472 Ventricular tachycardia, unspecified: Secondary | ICD-10-CM | POA: Diagnosis not present

## 2023-03-18 DIAGNOSIS — Z1331 Encounter for screening for depression: Secondary | ICD-10-CM | POA: Diagnosis not present

## 2023-04-02 ENCOUNTER — Ambulatory Visit: Payer: PPO | Attending: Cardiology | Admitting: Cardiology

## 2023-04-02 ENCOUNTER — Encounter: Payer: Self-pay | Admitting: Cardiology

## 2023-04-02 VITALS — BP 164/88 | HR 64 | Resp 97 | Ht 68.0 in | Wt 242.2 lb

## 2023-04-02 DIAGNOSIS — Z951 Presence of aortocoronary bypass graft: Secondary | ICD-10-CM | POA: Diagnosis not present

## 2023-04-02 DIAGNOSIS — E782 Mixed hyperlipidemia: Secondary | ICD-10-CM

## 2023-04-02 DIAGNOSIS — I10 Essential (primary) hypertension: Secondary | ICD-10-CM | POA: Diagnosis not present
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
## 2023-04-02 NOTE — Patient Instructions (Signed)
Medication Instructions:   Your physician recommends that you continue on your current medications as directed. Please refer to the Current Medication list given to you today.  *If you need a refill on your cardiac medications before your next appointment, please call your pharmacy*   Lab Work:  None Ordered  If you have labs (blood work) drawn today and your tests are completely normal, you will receive your results only by: MyChart Message (if you have MyChart) OR A paper copy in the mail If you have any lab test that is abnormal or we need to change your treatment, we will call you to review the results.   Testing/Procedures:  None Ordered   Follow-Up: At [facility], you and your health needs are our priority.  As part of our continuing mission to provide you with exceptional heart care, we have created designated Provider Care Teams.  These Care Teams include your primary Cardiologist (physician) and Advanced Practice Providers (APPs -  Physician Assistants and Nurse Practitioners) who all work together to provide you with the care you need, when you need it.  We recommend signing up for the patient portal called "MyChart".  Sign up information is provided on this After Visit Summary.  MyChart is used to connect with patients for Virtual Visits (Telemedicine).  Patients are able to view lab/test results, encounter notes, upcoming appointments, etc.  Non-urgent messages can be sent to your provider as well.   To learn more about what you can do with MyChart, go to [email or web address].    Your next appointment:   6 month(s)  Provider:   You may see Brian Agbor-Etang, MD or one of the following Advanced Practice Providers on your designated Care Team:   Christopher Berge, NP Ryan Dunn, PA-C Cadence Furth, PA-C Sheri Hammock, NP 

## 2023-04-04 ENCOUNTER — Telehealth: Payer: Self-pay | Admitting: Cardiology

## 2023-04-04 NOTE — Telephone Encounter (Signed)
Pt stated at his last office visit he was told to check in with his BP readings. He stated its been around 140-150/70-75 and would like to know if MD wanted to change his BP medication. He'd like a callback regarding this matter. Please advise

## 2023-04-04 NOTE — Telephone Encounter (Signed)
Spoke with patient and he was told to monitor blood pressures and give a call back. His blood pressures are running SBP 140-150's and DBP 60-80. He also noted his heart rates have been 59-60. Inquired who recommended that he monitor those readings and he stated it was Dr. Azucena Cecil. Advised that I would forward this over to him for review and then we would call him back with any recommendations. He verbalized understanding with no further questions for now.

## 2023-04-07 MED ORDER — LOSARTAN POTASSIUM 50 MG PO TABS: 50.0000 mg | ORAL_TABLET | Freq: Every day | ORAL | 3 refills | Status: DC

## 2023-04-07 NOTE — Telephone Encounter (Signed)
Reviewed recommendations with patient and he verbalized understanding with no further questions at this time.  

## 2023-04-07 NOTE — Telephone Encounter (Signed)
Patient returned call

## 2023-04-07 NOTE — Telephone Encounter (Signed)
Left voicemail message to call back for review of provider recommendations.    Debbe Odea, MD  Bryna Colander, RN; Margrett Rud, CMA Caller: Unspecified (3 days ago, 11:31 AM) Increase losartan to 50 mg daily.

## 2023-04-08 ENCOUNTER — Other Ambulatory Visit: Payer: Self-pay

## 2023-04-08 MED ORDER — METOPROLOL SUCCINATE ER 50 MG PO TB24: ORAL_TABLET | ORAL | 3 refills | Status: DC

## 2023-05-01 DIAGNOSIS — N182 Chronic kidney disease, stage 2 (mild): Secondary | ICD-10-CM | POA: Diagnosis not present

## 2023-05-01 DIAGNOSIS — R7303 Prediabetes: Secondary | ICD-10-CM | POA: Diagnosis not present

## 2023-05-01 DIAGNOSIS — I10 Essential (primary) hypertension: Secondary | ICD-10-CM | POA: Diagnosis not present

## 2023-05-07 ENCOUNTER — Inpatient Hospital Stay: Payer: PPO | Attending: Oncology

## 2023-05-07 ENCOUNTER — Other Ambulatory Visit: Payer: Self-pay | Admitting: Oncology

## 2023-05-07 ENCOUNTER — Inpatient Hospital Stay: Payer: PPO

## 2023-05-07 VITALS — BP 142/82 | HR 72 | Temp 96.8°F | Resp 20

## 2023-05-07 DIAGNOSIS — D751 Secondary polycythemia: Secondary | ICD-10-CM | POA: Diagnosis not present

## 2023-05-07 LAB — CBC WITH DIFFERENTIAL/PLATELET
Abs Immature Granulocytes: 0.04 10*3/uL (ref 0.00–0.07)
Basophils Absolute: 0.1 10*3/uL (ref 0.0–0.1)
Basophils Relative: 1 %
Eosinophils Absolute: 0.3 10*3/uL (ref 0.0–0.5)
Eosinophils Relative: 3 %
HCT: 55.1 % — ABNORMAL HIGH (ref 39.0–52.0)
Hemoglobin: 17.9 g/dL — ABNORMAL HIGH (ref 13.0–17.0)
Immature Granulocytes: 0 %
Lymphocytes Relative: 20 %
Lymphs Abs: 1.9 10*3/uL (ref 0.7–4.0)
MCH: 29.1 pg (ref 26.0–34.0)
MCHC: 32.5 g/dL (ref 30.0–36.0)
MCV: 89.4 fL (ref 80.0–100.0)
Monocytes Absolute: 1.1 10*3/uL — ABNORMAL HIGH (ref 0.1–1.0)
Monocytes Relative: 11 %
Neutro Abs: 6.2 10*3/uL (ref 1.7–7.7)
Neutrophils Relative %: 65 %
Platelets: 247 10*3/uL (ref 150–400)
RBC: 6.16 MIL/uL — ABNORMAL HIGH (ref 4.22–5.81)
RDW: 16 % — ABNORMAL HIGH (ref 11.5–15.5)
WBC: 9.5 10*3/uL (ref 4.0–10.5)
nRBC: 0 % (ref 0.0–0.2)

## 2023-06-13 ENCOUNTER — Telehealth: Payer: Self-pay | Admitting: Cardiology

## 2023-06-13 NOTE — Telephone Encounter (Signed)
Pt called stating his nephrologist recommended he change losartan from 50 mg daily to 25 mg twice a day. Pt unsure as the reason but stated his nephrologist wasn't happy with his last blood pressure reading at their office.   Per care every BP was 132/67, however note does not indicate the reason for change.  Pt stated he would like to verify with Dr. Myriam Forehand before he make changes  Will forward to MD for review

## 2023-06-13 NOTE — Telephone Encounter (Signed)
Pt c/o medication issue:  1. Name of Medication: losartan (COZAAR) 50 MG tablet   2. How are you currently taking this medication (dosage and times per day)?   3. Are you having a reaction (difficulty breathing--STAT)?   4. What is your medication issue? Patient states that his kidney doctor has asked if this medication should be taken down to 25 mg with his latest readings of BP 125/75 & 150/85. Please advise.

## 2023-06-16 NOTE — Telephone Encounter (Signed)
Patient is calling back due to not hearing back about his medication change. Please advise.

## 2023-06-16 NOTE — Telephone Encounter (Signed)
Patient is following up regarding refill request. He is requesting a call back ASAP.

## 2023-06-16 NOTE — Telephone Encounter (Signed)
The patient would like Dr. Amaryllis Dyke recommendations on taking the Losartan 25 mg bid and would like a prescription sent in for that if he is okay with it.

## 2023-06-17 ENCOUNTER — Other Ambulatory Visit: Payer: Self-pay

## 2023-06-17 MED ORDER — LOSARTAN POTASSIUM 25 MG PO TABS: 25.0000 mg | ORAL_TABLET | Freq: Two times a day (BID) | ORAL | 3 refills | Status: DC

## 2023-06-17 NOTE — Telephone Encounter (Signed)
Spoke with patient.  Reviewed Dr. Azucena Cecil recommends as stated below:  "Okay for losartan 25 mg twice daily.  Not sure why the change as it means taking 2 pills instead of 1.  But if patient wants that that is okay.  Thank you"   Patient was grateful for the call.

## 2023-07-07 ENCOUNTER — Inpatient Hospital Stay: Payer: PPO

## 2023-07-10 ENCOUNTER — Inpatient Hospital Stay: Payer: PPO | Attending: Oncology

## 2023-07-10 ENCOUNTER — Inpatient Hospital Stay: Payer: PPO

## 2023-07-10 ENCOUNTER — Other Ambulatory Visit: Payer: Self-pay

## 2023-07-10 VITALS — BP 152/77 | HR 61 | Resp 20

## 2023-07-10 DIAGNOSIS — D751 Secondary polycythemia: Secondary | ICD-10-CM | POA: Diagnosis not present

## 2023-07-10 LAB — CBC WITH DIFFERENTIAL (CANCER CENTER ONLY)
Abs Immature Granulocytes: 0.03 10*3/uL (ref 0.00–0.07)
Basophils Absolute: 0 10*3/uL (ref 0.0–0.1)
Basophils Relative: 1 %
Eosinophils Absolute: 0.4 10*3/uL (ref 0.0–0.5)
Eosinophils Relative: 5 %
HCT: 53.9 % — ABNORMAL HIGH (ref 39.0–52.0)
Hemoglobin: 17.2 g/dL — ABNORMAL HIGH (ref 13.0–17.0)
Immature Granulocytes: 0 %
Lymphocytes Relative: 18 %
Lymphs Abs: 1.4 10*3/uL (ref 0.7–4.0)
MCH: 28 pg (ref 26.0–34.0)
MCHC: 31.9 g/dL (ref 30.0–36.0)
MCV: 87.8 fL (ref 80.0–100.0)
Monocytes Absolute: 1.1 10*3/uL — ABNORMAL HIGH (ref 0.1–1.0)
Monocytes Relative: 14 %
Neutro Abs: 4.9 10*3/uL (ref 1.7–7.7)
Neutrophils Relative %: 62 %
Platelet Count: 208 10*3/uL (ref 150–400)
RBC: 6.14 MIL/uL — ABNORMAL HIGH (ref 4.22–5.81)
RDW: 16.3 % — ABNORMAL HIGH (ref 11.5–15.5)
WBC Count: 7.8 10*3/uL (ref 4.0–10.5)
nRBC: 0 % (ref 0.0–0.2)

## 2023-07-10 NOTE — Patient Instructions (Signed)

## 2023-07-15 DIAGNOSIS — Z961 Presence of intraocular lens: Secondary | ICD-10-CM | POA: Diagnosis not present

## 2023-07-15 DIAGNOSIS — H26492 Other secondary cataract, left eye: Secondary | ICD-10-CM | POA: Diagnosis not present

## 2023-07-15 DIAGNOSIS — E0500 Thyrotoxicosis with diffuse goiter without thyrotoxic crisis or storm: Secondary | ICD-10-CM | POA: Diagnosis not present

## 2023-07-15 DIAGNOSIS — H3321 Serous retinal detachment, right eye: Secondary | ICD-10-CM | POA: Diagnosis not present

## 2023-07-16 ENCOUNTER — Telehealth: Payer: Self-pay | Admitting: Cardiology

## 2023-07-16 DIAGNOSIS — L728 Other follicular cysts of the skin and subcutaneous tissue: Secondary | ICD-10-CM | POA: Diagnosis not present

## 2023-07-16 DIAGNOSIS — R58 Hemorrhage, not elsewhere classified: Secondary | ICD-10-CM | POA: Diagnosis not present

## 2023-07-16 DIAGNOSIS — L538 Other specified erythematous conditions: Secondary | ICD-10-CM | POA: Diagnosis not present

## 2023-07-16 DIAGNOSIS — R208 Other disturbances of skin sensation: Secondary | ICD-10-CM | POA: Diagnosis not present

## 2023-07-16 DIAGNOSIS — L720 Epidermal cyst: Secondary | ICD-10-CM | POA: Diagnosis not present

## 2023-07-16 NOTE — Telephone Encounter (Signed)
*  STAT* If patient is at the pharmacy, call can be transferred to refill team.   1. Which medications need to be refilled? (please list name of each medication and dose if known)   metoprolol succinate (TOPROL-XL) 50 MG 24 hr tablet    2. Which pharmacy/location (including street and city if local pharmacy) is medication to be sent to?  [facility] [address], [place] - [address]      3. Do they need a 30 day or 90 day supply? 90 day    Pt is completely out of medication and is sitting at the pharmacy waiting for them to get prescription.

## 2023-07-17 ENCOUNTER — Other Ambulatory Visit: Payer: Self-pay

## 2023-07-17 ENCOUNTER — Telehealth: Payer: Self-pay | Admitting: Cardiology

## 2023-07-17 MED ORDER — METOPROLOL SUCCINATE ER 50 MG PO TB24: ORAL_TABLET | ORAL | 3 refills | Status: DC

## 2023-07-17 NOTE — Telephone Encounter (Signed)
Requested Prescriptions   Signed Prescriptions Disp Refills   metoprolol succinate (TOPROL-XL) 50 MG 24 hr tablet 180 tablet 3    Sig: TAKE 1 TABLET BY MOUTH TWICE DAILY (AM  AND  BEDTIME)  WITH  OR  IMMEDIATELY  FOLLOWING  A  MEAL    Authorizing Provider: Debbe Odea    Ordering User: Margrett Rud

## 2023-07-17 NOTE — Telephone Encounter (Signed)
Called patient back, he states that his Hematologist is concerned with this blood pressure readings. Advised that he is continuing his medications, losartan bid, and metoprolol daily. Patient states they mentioned maybe trying another medication with his current medications, or increasing dosages. I advised patient to have him check his blood pressure 1-2 hours after medications, for at least a week for a blood pressure log, checking at different times daily so we can see the blood pressure readings at home, and then have MD review for changes.  Patient states he will do this, aware to call back or mychart with blood pressure readings.  Will route to covering CMA as FYI.   Thanks!

## 2023-07-17 NOTE — Telephone Encounter (Signed)
New message:        Patient says he needs to talk to Dr Benjie Karvonen nurse please. He needs to discuss what his Hematologist said about his blood pressure.

## 2023-07-25 DIAGNOSIS — E0500 Thyrotoxicosis with diffuse goiter without thyrotoxic crisis or storm: Secondary | ICD-10-CM | POA: Diagnosis not present

## 2023-08-05 DIAGNOSIS — D2271 Melanocytic nevi of right lower limb, including hip: Secondary | ICD-10-CM | POA: Diagnosis not present

## 2023-08-05 DIAGNOSIS — D2262 Melanocytic nevi of left upper limb, including shoulder: Secondary | ICD-10-CM | POA: Diagnosis not present

## 2023-08-05 DIAGNOSIS — L570 Actinic keratosis: Secondary | ICD-10-CM | POA: Diagnosis not present

## 2023-08-05 DIAGNOSIS — D2261 Melanocytic nevi of right upper limb, including shoulder: Secondary | ICD-10-CM | POA: Diagnosis not present

## 2023-08-05 DIAGNOSIS — Z85828 Personal history of other malignant neoplasm of skin: Secondary | ICD-10-CM | POA: Diagnosis not present

## 2023-09-12 DIAGNOSIS — E7800 Pure hypercholesterolemia, unspecified: Secondary | ICD-10-CM | POA: Diagnosis not present

## 2023-09-12 DIAGNOSIS — E1122 Type 2 diabetes mellitus with diabetic chronic kidney disease: Secondary | ICD-10-CM | POA: Diagnosis not present

## 2023-09-12 DIAGNOSIS — N1831 Chronic kidney disease, stage 3a: Secondary | ICD-10-CM | POA: Diagnosis not present

## 2023-09-12 DIAGNOSIS — E039 Hypothyroidism, unspecified: Secondary | ICD-10-CM | POA: Diagnosis not present

## 2023-09-12 DIAGNOSIS — E291 Testicular hypofunction: Secondary | ICD-10-CM | POA: Diagnosis not present

## 2023-09-18 DIAGNOSIS — Z79899 Other long term (current) drug therapy: Secondary | ICD-10-CM | POA: Diagnosis not present

## 2023-09-18 DIAGNOSIS — E291 Testicular hypofunction: Secondary | ICD-10-CM | POA: Diagnosis not present

## 2023-09-18 DIAGNOSIS — E1122 Type 2 diabetes mellitus with diabetic chronic kidney disease: Secondary | ICD-10-CM | POA: Diagnosis not present

## 2023-09-18 DIAGNOSIS — E7800 Pure hypercholesterolemia, unspecified: Secondary | ICD-10-CM | POA: Diagnosis not present

## 2023-09-18 DIAGNOSIS — E039 Hypothyroidism, unspecified: Secondary | ICD-10-CM | POA: Diagnosis not present

## 2023-09-18 DIAGNOSIS — Z125 Encounter for screening for malignant neoplasm of prostate: Secondary | ICD-10-CM | POA: Diagnosis not present

## 2023-09-18 DIAGNOSIS — N1831 Chronic kidney disease, stage 3a: Secondary | ICD-10-CM | POA: Diagnosis not present

## 2023-09-18 DIAGNOSIS — I10 Essential (primary) hypertension: Secondary | ICD-10-CM | POA: Diagnosis not present

## 2023-09-19 ENCOUNTER — Telehealth: Payer: Self-pay | Admitting: Nurse Practitioner

## 2023-09-19 NOTE — Telephone Encounter (Signed)
Pt called and stated that he needed to schedule a follow up for phlebotomy. Please advise.

## 2023-10-01 ENCOUNTER — Other Ambulatory Visit: Payer: Self-pay

## 2023-10-01 DIAGNOSIS — D751 Secondary polycythemia: Secondary | ICD-10-CM

## 2023-10-02 ENCOUNTER — Inpatient Hospital Stay: Payer: PPO

## 2023-10-02 ENCOUNTER — Inpatient Hospital Stay: Payer: PPO | Admitting: Nurse Practitioner

## 2023-10-02 ENCOUNTER — Encounter: Payer: Self-pay | Admitting: Nurse Practitioner

## 2023-10-02 ENCOUNTER — Inpatient Hospital Stay: Payer: PPO | Attending: Oncology

## 2023-10-02 VITALS — BP 131/83 | HR 66

## 2023-10-02 VITALS — BP 125/58 | HR 73 | Temp 97.2°F | Wt 242.0 lb

## 2023-10-02 DIAGNOSIS — Z79890 Hormone replacement therapy (postmenopausal): Secondary | ICD-10-CM | POA: Diagnosis not present

## 2023-10-02 DIAGNOSIS — D751 Secondary polycythemia: Secondary | ICD-10-CM | POA: Insufficient documentation

## 2023-10-02 LAB — CBC WITH DIFFERENTIAL/PLATELET
Abs Immature Granulocytes: 0.02 10*3/uL (ref 0.00–0.07)
Basophils Absolute: 0.1 10*3/uL (ref 0.0–0.1)
Basophils Relative: 1 %
Eosinophils Absolute: 0.3 10*3/uL (ref 0.0–0.5)
Eosinophils Relative: 4 %
HCT: 54 % — ABNORMAL HIGH (ref 39.0–52.0)
Hemoglobin: 17 g/dL (ref 13.0–17.0)
Immature Granulocytes: 0 %
Lymphocytes Relative: 18 %
Lymphs Abs: 1.5 10*3/uL (ref 0.7–4.0)
MCH: 27.6 pg (ref 26.0–34.0)
MCHC: 31.5 g/dL (ref 30.0–36.0)
MCV: 87.5 fL (ref 80.0–100.0)
Monocytes Absolute: 0.7 10*3/uL (ref 0.1–1.0)
Monocytes Relative: 8 %
Neutro Abs: 6 10*3/uL (ref 1.7–7.7)
Neutrophils Relative %: 69 %
Platelets: 256 10*3/uL (ref 150–400)
RBC: 6.17 MIL/uL — ABNORMAL HIGH (ref 4.22–5.81)
RDW: 16.3 % — ABNORMAL HIGH (ref 11.5–15.5)
WBC: 8.6 10*3/uL (ref 4.0–10.5)
nRBC: 0 % (ref 0.0–0.2)
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
## 2023-10-02 NOTE — Patient Instructions (Signed)

## 2023-10-03 ENCOUNTER — Encounter: Payer: Self-pay | Admitting: Cardiology

## 2023-10-03 ENCOUNTER — Ambulatory Visit: Payer: PPO | Attending: Cardiology | Admitting: Cardiology

## 2023-10-03 VITALS — BP 104/60 | HR 84 | Ht 69.0 in | Wt 241.2 lb

## 2023-10-03 DIAGNOSIS — I10 Essential (primary) hypertension: Secondary | ICD-10-CM

## 2023-10-03 DIAGNOSIS — Z951 Presence of aortocoronary bypass graft: Secondary | ICD-10-CM | POA: Diagnosis not present

## 2023-10-03 DIAGNOSIS — E782 Mixed hyperlipidemia: Secondary | ICD-10-CM

## 2023-10-03 NOTE — Patient Instructions (Signed)
 Medication Instructions:  No changes at this time.   *If you need a refill on your cardiac medications before your next appointment, please call your pharmacy*   Lab Work: None  If you have labs (blood work) drawn today and your tests are completely normal, you will receive your results only by: MyChart Message (if you have MyChart) OR A paper copy in the mail If you have any lab test that is abnormal or we need to change your treatment, we will call you to review the results.   Testing/Procedures: None   Follow-Up: At [facility], you and your health needs are our priority.  As part of our continuing mission to provide you with exceptional heart care, we have created designated Provider Care Teams.  These Care Teams include your primary Cardiologist (physician) and Advanced Practice Providers (APPs -  Physician Assistants and Nurse Practitioners) who all work together to provide you with the care you need, when you need it.  Your next appointment:   1 year(s)  Provider:   You may see Debbe Odea, MD or one of the following Advanced Practice Providers on your designated Care Team:   Nicolasa Ducking, NP Eula Listen, PA-C Cadence Fransico Michael, PA-C Charlsie Quest, NP Carlos Levering, NP
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
## 2023-10-21 ENCOUNTER — Telehealth: Payer: Self-pay | Admitting: Cardiology

## 2023-10-21 MED ORDER — ATORVASTATIN CALCIUM 80 MG PO TABS: 80.0000 mg | ORAL_TABLET | Freq: Every day | ORAL | 3 refills | Status: AC

## 2023-10-21 MED ORDER — LOSARTAN POTASSIUM 25 MG PO TABS: 25.0000 mg | ORAL_TABLET | Freq: Two times a day (BID) | ORAL | 3 refills | Status: DC

## 2023-10-21 NOTE — Telephone Encounter (Signed)
*  STAT* If patient is at the pharmacy, call can be transferred to refill team.   1. Which medications need to be refilled? (please list name of each medication and dose if known)   atorvastatin (LIPITOR) 80 MG tablet   losartan (COZAAR) 25 MG tablet  2. Which pharmacy/location (including street and city if local pharmacy) is medication to be sent to? [facility] [address] [place] - [address]    3. Do they need a 30 day or 90 day supply? 90 day

## 2023-11-21 ENCOUNTER — Encounter: Payer: Self-pay | Admitting: Oncology

## 2023-12-01 ENCOUNTER — Other Ambulatory Visit: Payer: Self-pay | Admitting: *Deleted

## 2023-12-01 DIAGNOSIS — D751 Secondary polycythemia: Secondary | ICD-10-CM

## 2023-12-02 ENCOUNTER — Inpatient Hospital Stay: Payer: PPO

## 2023-12-02 ENCOUNTER — Inpatient Hospital Stay: Payer: PPO | Attending: Oncology

## 2023-12-02 DIAGNOSIS — D751 Secondary polycythemia: Secondary | ICD-10-CM | POA: Insufficient documentation

## 2023-12-02 LAB — CBC WITH DIFFERENTIAL/PLATELET
Abs Immature Granulocytes: 0.04 10*3/uL (ref 0.00–0.07)
Basophils Absolute: 0.1 10*3/uL (ref 0.0–0.1)
Basophils Relative: 1 %
Eosinophils Absolute: 0.3 10*3/uL (ref 0.0–0.5)
Eosinophils Relative: 4 %
HCT: 53 % — ABNORMAL HIGH (ref 39.0–52.0)
Hemoglobin: 17 g/dL (ref 13.0–17.0)
Immature Granulocytes: 1 %
Lymphocytes Relative: 15 %
Lymphs Abs: 1.3 10*3/uL (ref 0.7–4.0)
MCH: 27.6 pg (ref 26.0–34.0)
MCHC: 32.1 g/dL (ref 30.0–36.0)
MCV: 86.2 fL (ref 80.0–100.0)
Monocytes Absolute: 0.9 10*3/uL (ref 0.1–1.0)
Monocytes Relative: 10 %
Neutro Abs: 6.2 10*3/uL (ref 1.7–7.7)
Neutrophils Relative %: 69 %
Platelets: 281 10*3/uL (ref 150–400)
RBC: 6.15 MIL/uL — ABNORMAL HIGH (ref 4.22–5.81)
RDW: 16.6 % — ABNORMAL HIGH (ref 11.5–15.5)
WBC: 8.8 10*3/uL (ref 4.0–10.5)
nRBC: 0 % (ref 0.0–0.2)

## 2024-01-06 DIAGNOSIS — Z961 Presence of intraocular lens: Secondary | ICD-10-CM | POA: Diagnosis not present

## 2024-01-06 DIAGNOSIS — E0500 Thyrotoxicosis with diffuse goiter without thyrotoxic crisis or storm: Secondary | ICD-10-CM | POA: Diagnosis not present

## 2024-01-06 DIAGNOSIS — H3321 Serous retinal detachment, right eye: Secondary | ICD-10-CM | POA: Diagnosis not present

## 2024-01-29 ENCOUNTER — Other Ambulatory Visit: Payer: Self-pay | Admitting: *Deleted

## 2024-01-29 DIAGNOSIS — D751 Secondary polycythemia: Secondary | ICD-10-CM

## 2024-01-30 ENCOUNTER — Inpatient Hospital Stay: Payer: PPO | Attending: Oncology

## 2024-01-30 ENCOUNTER — Inpatient Hospital Stay: Attending: Oncology

## 2024-01-30 ENCOUNTER — Inpatient Hospital Stay: Payer: PPO

## 2024-01-30 ENCOUNTER — Inpatient Hospital Stay

## 2024-01-30 DIAGNOSIS — D751 Secondary polycythemia: Secondary | ICD-10-CM | POA: Diagnosis not present

## 2024-01-30 LAB — CBC WITH DIFFERENTIAL/PLATELET
Abs Immature Granulocytes: 0.05 10*3/uL (ref 0.00–0.07)
Basophils Absolute: 0.1 10*3/uL (ref 0.0–0.1)
Basophils Relative: 1 %
Eosinophils Absolute: 0.3 10*3/uL (ref 0.0–0.5)
Eosinophils Relative: 4 %
HCT: 52.2 % — ABNORMAL HIGH (ref 39.0–52.0)
Hemoglobin: 16.3 g/dL (ref 13.0–17.0)
Immature Granulocytes: 1 %
Lymphocytes Relative: 17 %
Lymphs Abs: 1.5 10*3/uL (ref 0.7–4.0)
MCH: 27.3 pg (ref 26.0–34.0)
MCHC: 31.2 g/dL (ref 30.0–36.0)
MCV: 87.4 fL (ref 80.0–100.0)
Monocytes Absolute: 0.9 10*3/uL (ref 0.1–1.0)
Monocytes Relative: 10 %
Neutro Abs: 5.7 10*3/uL (ref 1.7–7.7)
Neutrophils Relative %: 67 %
Platelets: 265 10*3/uL (ref 150–400)
RBC: 5.97 MIL/uL — ABNORMAL HIGH (ref 4.22–5.81)
RDW: 16 % — ABNORMAL HIGH (ref 11.5–15.5)
WBC: 8.5 10*3/uL (ref 4.0–10.5)
nRBC: 0 % (ref 0.0–0.2)

## 2024-01-30 NOTE — Progress Notes (Addendum)
 Hgb 16 today goal 17, per MD ok to pass on phleb, patient states he feels fine and really doesn't want it today

## 2024-02-09 DIAGNOSIS — J40 Bronchitis, not specified as acute or chronic: Secondary | ICD-10-CM | POA: Diagnosis not present

## 2024-02-09 DIAGNOSIS — Z03818 Encounter for observation for suspected exposure to other biological agents ruled out: Secondary | ICD-10-CM | POA: Diagnosis not present

## 2024-02-09 DIAGNOSIS — N1831 Chronic kidney disease, stage 3a: Secondary | ICD-10-CM | POA: Diagnosis not present

## 2024-02-09 DIAGNOSIS — E1122 Type 2 diabetes mellitus with diabetic chronic kidney disease: Secondary | ICD-10-CM | POA: Diagnosis not present

## 2024-02-09 DIAGNOSIS — J0110 Acute frontal sinusitis, unspecified: Secondary | ICD-10-CM | POA: Diagnosis not present

## 2024-02-09 DIAGNOSIS — H02844 Edema of left upper eyelid: Secondary | ICD-10-CM | POA: Diagnosis not present

## 2024-02-16 DIAGNOSIS — H5203 Hypermetropia, bilateral: Secondary | ICD-10-CM | POA: Diagnosis not present

## 2024-02-16 DIAGNOSIS — H26492 Other secondary cataract, left eye: Secondary | ICD-10-CM | POA: Diagnosis not present

## 2024-02-16 DIAGNOSIS — H338 Other retinal detachments: Secondary | ICD-10-CM | POA: Diagnosis not present

## 2024-02-16 DIAGNOSIS — H43811 Vitreous degeneration, right eye: Secondary | ICD-10-CM | POA: Diagnosis not present

## 2024-02-16 DIAGNOSIS — H3561 Retinal hemorrhage, right eye: Secondary | ICD-10-CM | POA: Diagnosis not present

## 2024-02-16 DIAGNOSIS — H524 Presbyopia: Secondary | ICD-10-CM | POA: Diagnosis not present

## 2024-02-16 DIAGNOSIS — H35411 Lattice degeneration of retina, right eye: Secondary | ICD-10-CM | POA: Diagnosis not present

## 2024-02-16 DIAGNOSIS — H52223 Regular astigmatism, bilateral: Secondary | ICD-10-CM | POA: Diagnosis not present

## 2024-02-16 DIAGNOSIS — H35372 Puckering of macula, left eye: Secondary | ICD-10-CM | POA: Diagnosis not present

## 2024-03-11 DIAGNOSIS — N1831 Chronic kidney disease, stage 3a: Secondary | ICD-10-CM | POA: Diagnosis not present

## 2024-03-11 DIAGNOSIS — E291 Testicular hypofunction: Secondary | ICD-10-CM | POA: Diagnosis not present

## 2024-03-11 DIAGNOSIS — Z125 Encounter for screening for malignant neoplasm of prostate: Secondary | ICD-10-CM | POA: Diagnosis not present

## 2024-03-11 DIAGNOSIS — E1122 Type 2 diabetes mellitus with diabetic chronic kidney disease: Secondary | ICD-10-CM | POA: Diagnosis not present

## 2024-03-11 DIAGNOSIS — E7800 Pure hypercholesterolemia, unspecified: Secondary | ICD-10-CM | POA: Diagnosis not present

## 2024-03-11 DIAGNOSIS — E039 Hypothyroidism, unspecified: Secondary | ICD-10-CM | POA: Diagnosis not present

## 2024-03-11 DIAGNOSIS — Z79899 Other long term (current) drug therapy: Secondary | ICD-10-CM | POA: Diagnosis not present

## 2024-03-18 DIAGNOSIS — E291 Testicular hypofunction: Secondary | ICD-10-CM | POA: Diagnosis not present

## 2024-03-18 DIAGNOSIS — E1122 Type 2 diabetes mellitus with diabetic chronic kidney disease: Secondary | ICD-10-CM | POA: Diagnosis not present

## 2024-03-18 DIAGNOSIS — D45 Polycythemia vera: Secondary | ICD-10-CM | POA: Diagnosis not present

## 2024-03-18 DIAGNOSIS — I472 Ventricular tachycardia, unspecified: Secondary | ICD-10-CM | POA: Diagnosis not present

## 2024-03-18 DIAGNOSIS — E039 Hypothyroidism, unspecified: Secondary | ICD-10-CM | POA: Diagnosis not present

## 2024-03-18 DIAGNOSIS — Z0000 Encounter for general adult medical examination without abnormal findings: Secondary | ICD-10-CM | POA: Diagnosis not present

## 2024-03-18 DIAGNOSIS — E7800 Pure hypercholesterolemia, unspecified: Secondary | ICD-10-CM | POA: Diagnosis not present

## 2024-03-18 DIAGNOSIS — Z1331 Encounter for screening for depression: Secondary | ICD-10-CM | POA: Diagnosis not present

## 2024-03-18 DIAGNOSIS — I10 Essential (primary) hypertension: Secondary | ICD-10-CM | POA: Diagnosis not present

## 2024-03-18 DIAGNOSIS — Z79899 Other long term (current) drug therapy: Secondary | ICD-10-CM | POA: Diagnosis not present

## 2024-03-18 DIAGNOSIS — N1831 Chronic kidney disease, stage 3a: Secondary | ICD-10-CM | POA: Diagnosis not present

## 2024-04-01 ENCOUNTER — Inpatient Hospital Stay: Payer: PPO

## 2024-04-01 ENCOUNTER — Inpatient Hospital Stay: Payer: PPO | Attending: Oncology | Admitting: Nurse Practitioner

## 2024-04-01 ENCOUNTER — Encounter: Payer: Self-pay | Admitting: Nurse Practitioner

## 2024-04-01 ENCOUNTER — Other Ambulatory Visit: Payer: Self-pay

## 2024-04-01 VITALS — BP 135/71 | HR 70 | Temp 97.0°F | Resp 16

## 2024-04-01 VITALS — BP 128/61 | HR 72 | Temp 98.0°F | Resp 16 | Wt 235.0 lb

## 2024-04-01 DIAGNOSIS — D751 Secondary polycythemia: Secondary | ICD-10-CM

## 2024-04-01 LAB — CBC WITH DIFFERENTIAL/PLATELET
Abs Immature Granulocytes: 0.04 10*3/uL (ref 0.00–0.07)
Basophils Absolute: 0.1 10*3/uL (ref 0.0–0.1)
Basophils Relative: 1 %
Eosinophils Absolute: 0.3 10*3/uL (ref 0.0–0.5)
Eosinophils Relative: 4 %
HCT: 53.1 % — ABNORMAL HIGH (ref 39.0–52.0)
Hemoglobin: 17.1 g/dL — ABNORMAL HIGH (ref 13.0–17.0)
Immature Granulocytes: 1 %
Lymphocytes Relative: 21 %
Lymphs Abs: 1.7 10*3/uL (ref 0.7–4.0)
MCH: 27.9 pg (ref 26.0–34.0)
MCHC: 32.2 g/dL (ref 30.0–36.0)
MCV: 86.5 fL (ref 80.0–100.0)
Monocytes Absolute: 0.9 10*3/uL (ref 0.1–1.0)
Monocytes Relative: 11 %
Neutro Abs: 5.2 10*3/uL (ref 1.7–7.7)
Neutrophils Relative %: 62 %
Platelets: 233 10*3/uL (ref 150–400)
RBC: 6.14 MIL/uL — ABNORMAL HIGH (ref 4.22–5.81)
RDW: 18.4 % — ABNORMAL HIGH (ref 11.5–15.5)
WBC: 8.3 10*3/uL (ref 4.0–10.5)
nRBC: 0 % (ref 0.0–0.2)

## 2024-04-01 NOTE — Progress Notes (Signed)
 Dustin Lawrence presents today for phlebotomy per MD orders. Phlebotomy procedure started at 1425 and ended at 1440. 500 mls removed. Patient tolerated procedure well. IV needle removed intact.
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
## 2024-04-01 NOTE — Patient Instructions (Signed)

## 2024-05-05 DIAGNOSIS — N1831 Chronic kidney disease, stage 3a: Secondary | ICD-10-CM | POA: Diagnosis not present

## 2024-05-05 DIAGNOSIS — I10 Essential (primary) hypertension: Secondary | ICD-10-CM | POA: Diagnosis not present

## 2024-05-11 ENCOUNTER — Encounter: Payer: Self-pay | Admitting: Oncology

## 2024-05-19 DIAGNOSIS — N1831 Chronic kidney disease, stage 3a: Secondary | ICD-10-CM | POA: Diagnosis not present

## 2024-06-01 ENCOUNTER — Ambulatory Visit: Admitting: Internal Medicine

## 2024-06-01 ENCOUNTER — Other Ambulatory Visit: Payer: Self-pay | Admitting: *Deleted

## 2024-06-01 ENCOUNTER — Other Ambulatory Visit: Payer: Self-pay

## 2024-06-01 ENCOUNTER — Encounter: Payer: Self-pay | Admitting: Internal Medicine

## 2024-06-01 VITALS — BP 126/82 | HR 70 | Temp 97.9°F | Resp 18 | Ht 69.0 in | Wt 243.9 lb

## 2024-06-01 DIAGNOSIS — Z23 Encounter for immunization: Secondary | ICD-10-CM

## 2024-06-01 DIAGNOSIS — I2510 Atherosclerotic heart disease of native coronary artery without angina pectoris: Secondary | ICD-10-CM

## 2024-06-01 DIAGNOSIS — I10 Essential (primary) hypertension: Secondary | ICD-10-CM | POA: Diagnosis not present

## 2024-06-01 DIAGNOSIS — D751 Secondary polycythemia: Secondary | ICD-10-CM

## 2024-06-01 DIAGNOSIS — R7989 Other specified abnormal findings of blood chemistry: Secondary | ICD-10-CM | POA: Diagnosis not present

## 2024-06-01 DIAGNOSIS — E034 Atrophy of thyroid (acquired): Secondary | ICD-10-CM | POA: Diagnosis not present

## 2024-06-01 MED ORDER — LEVOTHYROXINE SODIUM 175 MCG PO TABS: 175.0000 ug | ORAL_TABLET | Freq: Every morning | ORAL | 1 refills | Status: AC
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
## 2024-06-01 NOTE — Progress Notes (Signed)
cbcc

## 2024-06-02 ENCOUNTER — Inpatient Hospital Stay: Attending: Oncology

## 2024-06-02 ENCOUNTER — Inpatient Hospital Stay

## 2024-06-02 DIAGNOSIS — D571 Sickle-cell disease without crisis: Secondary | ICD-10-CM | POA: Diagnosis not present

## 2024-06-02 DIAGNOSIS — D751 Secondary polycythemia: Secondary | ICD-10-CM

## 2024-06-02 LAB — CBC WITH DIFFERENTIAL/PLATELET
Abs Immature Granulocytes: 0.02 K/uL (ref 0.00–0.07)
Basophils Absolute: 0.1 K/uL (ref 0.0–0.1)
Basophils Relative: 1 %
Eosinophils Absolute: 0.2 K/uL (ref 0.0–0.5)
Eosinophils Relative: 3 %
HCT: 50 % (ref 39.0–52.0)
Hemoglobin: 16.2 g/dL (ref 13.0–17.0)
Immature Granulocytes: 0 %
Lymphocytes Relative: 16 %
Lymphs Abs: 1.2 K/uL (ref 0.7–4.0)
MCH: 28.8 pg (ref 26.0–34.0)
MCHC: 32.4 g/dL (ref 30.0–36.0)
MCV: 88.8 fL (ref 80.0–100.0)
Monocytes Absolute: 0.8 K/uL (ref 0.1–1.0)
Monocytes Relative: 10 %
Neutro Abs: 5.2 K/uL (ref 1.7–7.7)
Neutrophils Relative %: 70 %
Platelets: 208 K/uL (ref 150–400)
RBC: 5.63 MIL/uL (ref 4.22–5.81)
RDW: 16 % — ABNORMAL HIGH (ref 11.5–15.5)
WBC: 7.5 K/uL (ref 4.0–10.5)
nRBC: 0 % (ref 0.0–0.2)

## 2024-06-02 NOTE — Progress Notes (Signed)
No phlebotomy today.

## 2024-06-03 ENCOUNTER — Telehealth: Payer: Self-pay

## 2024-06-03 NOTE — Telephone Encounter (Signed)
 Copied from CRM [id number]. Topic: Clinical - Request for Lab/Test Order >> Jun 03, 2024  3:19 PM Mia F wrote: Reason for CRM: Urology office called stating that they received a referral to schedule pt but the labs showing his testosterone  was low is missing and that is needed to schedule pt. Please fax to [phone] Beecher Shad

## 2024-06-08 NOTE — Telephone Encounter (Signed)
 Results in care everywhere, hx of low testestore and receiving injections for years

## 2024-07-05 DIAGNOSIS — Z961 Presence of intraocular lens: Secondary | ICD-10-CM | POA: Diagnosis not present

## 2024-07-05 DIAGNOSIS — H3321 Serous retinal detachment, right eye: Secondary | ICD-10-CM | POA: Diagnosis not present

## 2024-07-05 DIAGNOSIS — H02403 Unspecified ptosis of bilateral eyelids: Secondary | ICD-10-CM | POA: Diagnosis not present

## 2024-07-09 DIAGNOSIS — S8992XA Unspecified injury of left lower leg, initial encounter: Secondary | ICD-10-CM | POA: Diagnosis not present

## 2024-07-12 DIAGNOSIS — N1831 Chronic kidney disease, stage 3a: Secondary | ICD-10-CM | POA: Diagnosis not present

## 2024-07-14 ENCOUNTER — Encounter: Payer: Self-pay | Admitting: Urology

## 2024-07-14 ENCOUNTER — Ambulatory Visit (INDEPENDENT_AMBULATORY_CARE_PROVIDER_SITE_OTHER): Admitting: Urology

## 2024-07-14 VITALS — BP 158/76 | HR 81 | Ht 69.0 in | Wt 240.0 lb

## 2024-07-14 DIAGNOSIS — R7989 Other specified abnormal findings of blood chemistry: Secondary | ICD-10-CM | POA: Diagnosis not present

## 2024-07-15 ENCOUNTER — Other Ambulatory Visit: Payer: Self-pay

## 2024-07-15 ENCOUNTER — Ambulatory Visit (INDEPENDENT_AMBULATORY_CARE_PROVIDER_SITE_OTHER): Admitting: Internal Medicine

## 2024-07-15 ENCOUNTER — Encounter: Payer: Self-pay | Admitting: Internal Medicine

## 2024-07-15 ENCOUNTER — Ambulatory Visit: Payer: Self-pay

## 2024-07-15 VITALS — BP 136/84 | HR 80 | Temp 98.1°F | Resp 16 | Ht 69.0 in | Wt 244.4 lb

## 2024-07-15 DIAGNOSIS — R600 Localized edema: Secondary | ICD-10-CM

## 2024-07-15 DIAGNOSIS — L03116 Cellulitis of left lower limb: Secondary | ICD-10-CM | POA: Diagnosis not present

## 2024-07-15 MED ORDER — CEPHALEXIN 500 MG PO CAPS: 500.0000 mg | ORAL_CAPSULE | Freq: Four times a day (QID) | ORAL | 0 refills | Status: DC

## 2024-07-15 MED ORDER — FUROSEMIDE 20 MG PO TABS: 20.0000 mg | ORAL_TABLET | Freq: Every day | ORAL | 0 refills | Status: DC
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
## 2024-07-16 ENCOUNTER — Encounter: Payer: Self-pay | Admitting: Urology

## 2024-07-16 MED ORDER — TESTOSTERONE CYPIONATE 200 MG/ML IM SOLN: 140.0000 mg | INTRAMUSCULAR | 0 refills | Status: AC
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
## 2024-07-22 ENCOUNTER — Encounter: Payer: Self-pay | Admitting: Internal Medicine

## 2024-07-22 ENCOUNTER — Ambulatory Visit (INDEPENDENT_AMBULATORY_CARE_PROVIDER_SITE_OTHER): Admitting: Internal Medicine

## 2024-07-22 ENCOUNTER — Other Ambulatory Visit: Payer: Self-pay

## 2024-07-22 VITALS — BP 128/74 | HR 84 | Temp 98.4°F | Resp 16 | Ht 69.0 in

## 2024-07-22 DIAGNOSIS — R600 Localized edema: Secondary | ICD-10-CM | POA: Diagnosis not present

## 2024-07-22 DIAGNOSIS — L03116 Cellulitis of left lower limb: Secondary | ICD-10-CM | POA: Diagnosis not present

## 2024-07-22 MED ORDER — CEPHALEXIN 500 MG PO CAPS
500.0000 mg | ORAL_CAPSULE | Freq: Four times a day (QID) | ORAL | 0 refills | Status: AC
Start: 2024-07-22 — End: 2024-07-29

## 2024-07-22 MED ORDER — FUROSEMIDE 20 MG PO TABS: 20.0000 mg | ORAL_TABLET | Freq: Every day | ORAL | 0 refills | Status: DC

## 2024-07-22 NOTE — Progress Notes (Signed)
 Acute Office Visit  Subjective:     Patient ID: Dustin Lawrence, male    DOB: 1946-11-13, 78 y.o.   MRN: [id number]  Chief Complaint  Patient presents with   Cellulitis    1 week recheck    HPI Patient is in today for recheck of cellulitis. He is here with his wife.   Discussed the use of AI scribe software for clinical note transcription with the patient, who gave verbal consent to proceed.  History of Present Illness Dustin Lawrence is a 78 year old male who presents with a leg infection and swelling.  He experiences ongoing redness and swelling in his leg, with some improvement, but it remains red and warm. Pain has decreased compared to previous visits. He is currently on Keflex , although swelling and warmth persist.  He is taking Lasix  20 mg, which was reintroduced recently, and also takes chlorothiazide. There is no significant increase in urination frequency that is bothersome.  He uses Pepto for gastrointestinal discomfort, which he finds effective. He almost fell due to his shoe catching on a step, indicating some mobility challenges. He prefers in-person visits as it helps him get out of the house.    Review of Systems  Constitutional:  Negative for chills and fever.  Skin:  Positive for rash.        Objective:    BP 128/74 (Cuff Size: Large)   Pulse 84   Temp 98.4 F (36.9 C) (Oral)   Resp 16   Ht 5' 9 (1.753 m)   SpO2 94%   BMI 36.09 kg/m    Physical Exam Constitutional:      Appearance: Normal appearance.  HENT:     Head: Normocephalic and atraumatic.  Eyes:     Conjunctiva/sclera: Conjunctivae normal.  Cardiovascular:     Rate and Rhythm: Normal rate and regular rhythm.  Pulmonary:     Effort: Pulmonary effort is normal.     Breath sounds: Normal breath sounds.  Musculoskeletal:     Right lower leg: No edema.     Left lower leg: Edema present.  Skin:    General: Skin is warm and dry.     Findings: Erythema and rash present.      Comments: Erythema, swelling and redness still present but improved  Neurological:     General: No focal deficit present.     Mental Status: He is alert. Mental status is at baseline.  Psychiatric:        Mood and Affect: Mood normal.        Behavior: Behavior normal.     No results found for any visits on 07/22/24.      Assessment & Plan:   Assessment & Plan  Cellulitis of left lower limb with localized edema Cellulitis improving but not resolved. Area remains red and warm. Pain decreased. Tolerating Keflex . Edema managed with Lasix . Monitoring kidney function due to diuretics. - Extend Keflex  for 7 days. - Extend Lasix  20 mg for 7 days. - Order BMP to monitor kidney function and electrolytes. - Instruct him to go to [facility] at [facility] on [address] for lab work. - Refill medications for 1 week. - Schedule follow-up in 1 week, telemedicine or in-person. - Advise over-the-counter Pepcid  if Pepto-Bismol insufficient.  - Basic Metabolic Panel (BMET) - cephALEXin  (KEFLEX ) 500 MG capsule; Take 1 capsule (500 mg total) by mouth 4 (four) times daily for 7 days.  Dispense: 28 capsule; Refill: 0 - furosemide  (LASIX ) 20  MG tablet; Take 1 tablet (20 mg total) by mouth daily for 7 days.  Dispense: 7 tablet; Refill: 0   Return in about 1 week (around 07/29/2024).  Sharyle Fischer, DO

## 2024-07-23 ENCOUNTER — Ambulatory Visit: Payer: Self-pay | Admitting: Internal Medicine

## 2024-07-23 LAB — BASIC METABOLIC PANEL WITH GFR
BUN/Creatinine Ratio: 15 (ref 10–24)
BUN: 22 mg/dL (ref 8–27)
CO2: 25 mmol/L (ref 20–29)
Calcium: 9.2 mg/dL (ref 8.6–10.2)
Chloride: 96 mmol/L (ref 96–106)
Creatinine, Ser: 1.49 mg/dL — ABNORMAL HIGH (ref 0.76–1.27)
Glucose: 112 mg/dL — ABNORMAL HIGH (ref 70–99)
Potassium: 4.3 mmol/L (ref 3.5–5.2)
Sodium: 138 mmol/L (ref 134–144)
eGFR: 48 mL/min/1.73 — ABNORMAL LOW (ref >59)

## 2024-08-02 ENCOUNTER — Inpatient Hospital Stay: Attending: Oncology

## 2024-08-02 ENCOUNTER — Other Ambulatory Visit: Payer: Self-pay

## 2024-08-02 ENCOUNTER — Encounter: Payer: Self-pay | Admitting: Internal Medicine

## 2024-08-02 ENCOUNTER — Inpatient Hospital Stay

## 2024-08-02 ENCOUNTER — Ambulatory Visit (INDEPENDENT_AMBULATORY_CARE_PROVIDER_SITE_OTHER): Admitting: Internal Medicine

## 2024-08-02 VITALS — BP 140/79 | HR 73 | Temp 97.6°F | Resp 18

## 2024-08-02 VITALS — BP 132/72 | HR 79 | Resp 18 | Ht 69.0 in | Wt 244.8 lb

## 2024-08-02 DIAGNOSIS — R600 Localized edema: Secondary | ICD-10-CM

## 2024-08-02 DIAGNOSIS — Z79890 Hormone replacement therapy (postmenopausal): Secondary | ICD-10-CM | POA: Diagnosis not present

## 2024-08-02 DIAGNOSIS — D751 Secondary polycythemia: Secondary | ICD-10-CM | POA: Diagnosis not present

## 2024-08-02 LAB — CBC WITH DIFFERENTIAL/PLATELET
Abs Immature Granulocytes: 0.07 K/uL (ref 0.00–0.07)
Basophils Absolute: 0.1 K/uL (ref 0.0–0.1)
Basophils Relative: 1 %
Eosinophils Absolute: 0.3 K/uL (ref 0.0–0.5)
Eosinophils Relative: 3 %
HCT: 52.3 % — ABNORMAL HIGH (ref 39.0–52.0)
Hemoglobin: 17.3 g/dL — ABNORMAL HIGH (ref 13.0–17.0)
Immature Granulocytes: 1 %
Lymphocytes Relative: 20 %
Lymphs Abs: 1.9 K/uL (ref 0.7–4.0)
MCH: 29.4 pg (ref 26.0–34.0)
MCHC: 33.1 g/dL (ref 30.0–36.0)
MCV: 88.8 fL (ref 80.0–100.0)
Monocytes Absolute: 1.1 K/uL — ABNORMAL HIGH (ref 0.1–1.0)
Monocytes Relative: 11 %
Neutro Abs: 6.2 K/uL (ref 1.7–7.7)
Neutrophils Relative %: 64 %
Platelets: 271 K/uL (ref 150–400)
RBC: 5.89 MIL/uL — ABNORMAL HIGH (ref 4.22–5.81)
RDW: 16.4 % — ABNORMAL HIGH (ref 11.5–15.5)
WBC: 9.7 K/uL (ref 4.0–10.5)
nRBC: 0 % (ref 0.0–0.2)

## 2024-08-02 MED ORDER — FUROSEMIDE 20 MG PO TABS: 10.0000 mg | ORAL_TABLET | Freq: Every day | ORAL | 0 refills | Status: DC

## 2024-08-02 NOTE — Progress Notes (Signed)
   Acute Office Visit  Subjective:     Patient ID: Dustin Lawrence, male    DOB: November 27, 1945, 78 y.o.   MRN: [id number]  Chief Complaint  Patient presents with   Cellulitis    1 week recheck    HPI Patient is in today for recheck of cellulitis. He is here with his wife.   Discussed the use of AI scribe software for clinical note transcription with the patient, who gave verbal consent to proceed.  History of Present Illness Dustin Lawrence is a 78 year old male with cellulitis who presents for follow-up of leg swelling and medication management.  He completed a course of antibiotics last night, but swelling persists, particularly on one side. There is residual warmth and redness in the affected area without current pain.  He has been taking Lasix  (furosemide ) 20 mg daily, which he has now completed, and chlorthalidone as part of his diuretic regimen. There is a slight increase in kidney function tests.    Review of Systems  Constitutional:  Negative for chills and fever.  Cardiovascular:  Positive for leg swelling.        Objective:    BP 132/72 (Cuff Size: Large)   Pulse 79   Resp 18   Ht 5' 9 (1.753 m)   Wt 244 lb 12.8 oz (111 kg)   SpO2 97%   BMI 36.15 kg/m    Physical Exam Constitutional:      Appearance: Normal appearance.  HENT:     Head: Normocephalic and atraumatic.  Eyes:     Conjunctiva/sclera: Conjunctivae normal.  Cardiovascular:     Rate and Rhythm: Normal rate and regular rhythm.  Pulmonary:     Effort: Pulmonary effort is normal.     Breath sounds: Normal breath sounds.  Musculoskeletal:     Right lower leg: No edema.     Left lower leg: Edema present.  Skin:    General: Skin is warm and dry.     Findings: Erythema present.     Comments: Mild erythema with swelling but no pain, cellulitis improved.   Neurological:     General: No focal deficit present.     Mental Status: He is alert. Mental status is at baseline.  Psychiatric:         Mood and Affect: Mood normal.        Behavior: Behavior normal.     No results found for any visits on 08/02/24.      Assessment & Plan:   Assessment & Plan  Localized lower extremity edema Persistent edema with fluid retention. Adjusted diuretic therapy due to slight increase in kidney function. - Reduce furosemide  to 10 mg daily by cutting the pill in half. - Continue chlorthalidone as previously prescribed. - Prescribe 30 pills of furosemide  to be taken as directed. - Re-evaluate edema and kidney function at follow-up in October. - Advise use of moisturizer to prevent skin dryness and infection.  Cellulitis, resolved Cellulitis resolved with completed antibiotics. Residual warmth and redness attributed to fluid retention. - Discontinue antibiotic therapy.  - furosemide  (LASIX ) 20 MG tablet; Take 0.5 tablets (10 mg total) by mouth daily.  Dispense: 30 tablet; Refill: 0   Return for already scheduled.  Sharyle Fischer, DO

## 2024-08-02 NOTE — Patient Instructions (Signed)

## 2024-08-02 NOTE — Progress Notes (Signed)
 Dustin Lawrence presents today for phlebotomy per MD orders. Phlebotomy procedure started at 1505 and ended at 1527. 500 mls removed. Patient tolerated procedure well. IV needle removed intact.

## 2024-08-03 DIAGNOSIS — H33191 Other retinoschisis and retinal cysts, right eye: Secondary | ICD-10-CM | POA: Diagnosis not present

## 2024-08-03 DIAGNOSIS — H26492 Other secondary cataract, left eye: Secondary | ICD-10-CM | POA: Diagnosis not present

## 2024-08-03 DIAGNOSIS — I10 Essential (primary) hypertension: Secondary | ICD-10-CM | POA: Diagnosis not present

## 2024-08-03 DIAGNOSIS — H35372 Puckering of macula, left eye: Secondary | ICD-10-CM | POA: Diagnosis not present

## 2024-08-03 DIAGNOSIS — H43811 Vitreous degeneration, right eye: Secondary | ICD-10-CM | POA: Diagnosis not present

## 2024-08-03 DIAGNOSIS — H35033 Hypertensive retinopathy, bilateral: Secondary | ICD-10-CM | POA: Diagnosis not present

## 2024-08-10 DIAGNOSIS — D2262 Melanocytic nevi of left upper limb, including shoulder: Secondary | ICD-10-CM | POA: Diagnosis not present

## 2024-08-10 DIAGNOSIS — L905 Scar conditions and fibrosis of skin: Secondary | ICD-10-CM | POA: Diagnosis not present

## 2024-08-10 DIAGNOSIS — L570 Actinic keratosis: Secondary | ICD-10-CM | POA: Diagnosis not present

## 2024-08-10 DIAGNOSIS — D225 Melanocytic nevi of trunk: Secondary | ICD-10-CM | POA: Diagnosis not present

## 2024-08-10 DIAGNOSIS — Z08 Encounter for follow-up examination after completed treatment for malignant neoplasm: Secondary | ICD-10-CM | POA: Diagnosis not present

## 2024-08-10 DIAGNOSIS — Z85828 Personal history of other malignant neoplasm of skin: Secondary | ICD-10-CM | POA: Diagnosis not present

## 2024-08-10 DIAGNOSIS — D485 Neoplasm of uncertain behavior of skin: Secondary | ICD-10-CM | POA: Diagnosis not present

## 2024-08-10 DIAGNOSIS — D2261 Melanocytic nevi of right upper limb, including shoulder: Secondary | ICD-10-CM | POA: Diagnosis not present

## 2024-08-10 DIAGNOSIS — D045 Carcinoma in situ of skin of trunk: Secondary | ICD-10-CM | POA: Diagnosis not present

## 2024-08-10 DIAGNOSIS — D2271 Melanocytic nevi of right lower limb, including hip: Secondary | ICD-10-CM | POA: Diagnosis not present

## 2024-08-10 DIAGNOSIS — D2272 Melanocytic nevi of left lower limb, including hip: Secondary | ICD-10-CM | POA: Diagnosis not present

## 2024-08-10 DIAGNOSIS — D044 Carcinoma in situ of skin of scalp and neck: Secondary | ICD-10-CM | POA: Diagnosis not present

## 2024-08-10 DIAGNOSIS — L821 Other seborrheic keratosis: Secondary | ICD-10-CM | POA: Diagnosis not present

## 2024-08-12 DIAGNOSIS — H43813 Vitreous degeneration, bilateral: Secondary | ICD-10-CM | POA: Diagnosis not present

## 2024-08-12 DIAGNOSIS — H338 Other retinal detachments: Secondary | ICD-10-CM | POA: Diagnosis not present

## 2024-08-12 DIAGNOSIS — H35372 Puckering of macula, left eye: Secondary | ICD-10-CM | POA: Diagnosis not present

## 2024-08-14 ENCOUNTER — Other Ambulatory Visit: Payer: Self-pay | Admitting: Cardiology

## 2024-08-20 DIAGNOSIS — H33001 Unspecified retinal detachment with retinal break, right eye: Secondary | ICD-10-CM | POA: Diagnosis not present

## 2024-08-20 DIAGNOSIS — I2510 Atherosclerotic heart disease of native coronary artery without angina pectoris: Secondary | ICD-10-CM | POA: Diagnosis not present

## 2024-08-20 DIAGNOSIS — I129 Hypertensive chronic kidney disease with stage 1 through stage 4 chronic kidney disease, or unspecified chronic kidney disease: Secondary | ICD-10-CM | POA: Diagnosis not present

## 2024-08-20 DIAGNOSIS — N189 Chronic kidney disease, unspecified: Secondary | ICD-10-CM | POA: Diagnosis not present

## 2024-08-27 DIAGNOSIS — H33001 Unspecified retinal detachment with retinal break, right eye: Secondary | ICD-10-CM | POA: Diagnosis not present

## 2024-09-02 ENCOUNTER — Ambulatory Visit (INDEPENDENT_AMBULATORY_CARE_PROVIDER_SITE_OTHER): Admitting: Internal Medicine

## 2024-09-02 ENCOUNTER — Encounter: Payer: Self-pay | Admitting: Internal Medicine

## 2024-09-02 ENCOUNTER — Other Ambulatory Visit: Payer: Self-pay

## 2024-09-02 VITALS — BP 124/86 | HR 84 | Temp 98.2°F | Resp 16 | Ht 69.0 in | Wt 243.9 lb

## 2024-09-02 DIAGNOSIS — R7303 Prediabetes: Secondary | ICD-10-CM | POA: Diagnosis not present

## 2024-09-02 DIAGNOSIS — I10 Essential (primary) hypertension: Secondary | ICD-10-CM

## 2024-09-02 LAB — POCT GLYCOSYLATED HEMOGLOBIN (HGB A1C): Hemoglobin A1C: 6.5 % — AB (ref 4.0–5.6)

## 2024-09-02 MED ORDER — CHLORTHALIDONE 25 MG PO TABS: 25.0000 mg | ORAL_TABLET | Freq: Every day | ORAL | 1 refills | Status: AC
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
## 2024-09-13 ENCOUNTER — Other Ambulatory Visit

## 2024-09-13 DIAGNOSIS — R7989 Other specified abnormal findings of blood chemistry: Secondary | ICD-10-CM | POA: Diagnosis not present

## 2024-09-14 ENCOUNTER — Ambulatory Visit: Payer: Self-pay | Admitting: Urology

## 2024-09-14 LAB — TESTOSTERONE: Testosterone: 479 ng/dL (ref 264–916)

## 2024-09-14 LAB — HEMOGLOBIN AND HEMATOCRIT, BLOOD
Hematocrit: 53.7 % — ABNORMAL HIGH (ref 37.5–51.0)
Hemoglobin: 17.3 g/dL (ref 13.0–17.7)

## 2024-09-19 ENCOUNTER — Other Ambulatory Visit: Payer: Self-pay | Admitting: Urology

## 2024-09-19 DIAGNOSIS — D751 Secondary polycythemia: Secondary | ICD-10-CM

## 2024-09-24 DIAGNOSIS — H33001 Unspecified retinal detachment with retinal break, right eye: Secondary | ICD-10-CM | POA: Diagnosis not present

## 2024-09-28 ENCOUNTER — Telehealth: Payer: Self-pay

## 2024-09-28 DIAGNOSIS — R7303 Prediabetes: Secondary | ICD-10-CM

## 2024-09-28 DIAGNOSIS — D044 Carcinoma in situ of skin of scalp and neck: Secondary | ICD-10-CM | POA: Diagnosis not present

## 2024-09-28 DIAGNOSIS — L570 Actinic keratosis: Secondary | ICD-10-CM | POA: Diagnosis not present

## 2024-09-28 DIAGNOSIS — D045 Carcinoma in situ of skin of trunk: Secondary | ICD-10-CM | POA: Diagnosis not present

## 2024-09-28 NOTE — Progress Notes (Signed)
 Pharmacy Quality Measure Review  This patient is appearing on a report for being at risk of failing the  Kidney Health Evaluation for Patients with Diabetes measure this calendar year.   Last documented A1c on 09/02/24  Last documented UACR on 09/18/23  Appointment to be scheduled with in 02/2025. Will order UACR and route to clinical team to contact patient to return to clinic for updated labs.   Attallah Ontko E. Marsh, PharmD, CPP Clinical Pharmacist [facility] [phone]

## 2024-09-28 NOTE — Telephone Encounter (Signed)
 Pt notified, will drop by one day

## 2024-10-01 ENCOUNTER — Inpatient Hospital Stay

## 2024-10-01 ENCOUNTER — Encounter: Payer: Self-pay | Admitting: Nurse Practitioner

## 2024-10-01 ENCOUNTER — Inpatient Hospital Stay (HOSPITAL_BASED_OUTPATIENT_CLINIC_OR_DEPARTMENT_OTHER): Admitting: Nurse Practitioner

## 2024-10-01 ENCOUNTER — Inpatient Hospital Stay: Attending: Oncology

## 2024-10-01 ENCOUNTER — Other Ambulatory Visit: Payer: Self-pay | Admitting: *Deleted

## 2024-10-01 VITALS — BP 131/40 | HR 79 | Temp 97.6°F | Resp 17 | Wt 242.3 lb

## 2024-10-01 VITALS — BP 116/67 | HR 71

## 2024-10-01 DIAGNOSIS — D751 Secondary polycythemia: Secondary | ICD-10-CM

## 2024-10-01 LAB — CBC WITH DIFFERENTIAL (CANCER CENTER ONLY)
Abs Immature Granulocytes: 0.03 K/uL (ref 0.00–0.07)
Basophils Absolute: 0 K/uL (ref 0.0–0.1)
Basophils Relative: 1 %
Eosinophils Absolute: 0.1 K/uL (ref 0.0–0.5)
Eosinophils Relative: 1 %
HCT: 53.9 % — ABNORMAL HIGH (ref 39.0–52.0)
Hemoglobin: 17.6 g/dL — ABNORMAL HIGH (ref 13.0–17.0)
Immature Granulocytes: 0 %
Lymphocytes Relative: 16 %
Lymphs Abs: 1.3 K/uL (ref 0.7–4.0)
MCH: 29 pg (ref 26.0–34.0)
MCHC: 32.7 g/dL (ref 30.0–36.0)
MCV: 88.8 fL (ref 80.0–100.0)
Monocytes Absolute: 0.7 K/uL (ref 0.1–1.0)
Monocytes Relative: 9 %
Neutro Abs: 5.9 K/uL (ref 1.7–7.7)
Neutrophils Relative %: 73 %
Platelet Count: 255 K/uL (ref 150–400)
RBC: 6.07 MIL/uL — ABNORMAL HIGH (ref 4.22–5.81)
RDW: 16.7 % — ABNORMAL HIGH (ref 11.5–15.5)
WBC Count: 8.1 K/uL (ref 4.0–10.5)
nRBC: 0 % (ref 0.0–0.2)
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
## 2024-10-01 NOTE — Progress Notes (Signed)
 HATCHER FRONING                                          MRN: [id number]   10/01/2024   The VBCI Quality Team Specialist reviewed this patient medical record for the purposes of chart review for care gap closure. The following were reviewed: chart review for care gap closure-kidney health evaluation for diabetes:eGFR  and uACR.    VBCI Quality Team

## 2024-10-05 ENCOUNTER — Other Ambulatory Visit: Payer: Self-pay | Admitting: Cardiology

## 2024-10-07 ENCOUNTER — Telehealth: Payer: Self-pay | Admitting: Cardiology

## 2024-10-07 NOTE — Telephone Encounter (Signed)
Refill has already been sent in today. 

## 2024-10-07 NOTE — Telephone Encounter (Signed)
*  STAT* If patient is at the pharmacy, call can be transferred to refill team.   1. Which medications need to be refilled? (please list name of each medication and dose if known)   losartan  (COZAAR ) 25 MG tablet     4. Which pharmacy/location (including street and city if local pharmacy) is medication to be sent to?  [id number] - [place], [place] - [address]     5. Do they need a 30 day or 90 day supply? 90     Sch 12/26

## 2024-10-29 NOTE — Progress Notes (Signed)
 Pharmacy Quality Measure Review  This patient is appearing on a report for being at risk of failing the adherence measure for hypertension (ACEi/ARB) medications this calendar year.   Medication: losartan  Last fill date: 10/07/24 for 90 day supply  Insurance report was not up to date. No action needed at this time.   Leeandre Nordling E. Marsh, PharmD, CPP Clinical Pharmacist [facility] [phone]

## 2024-11-12 ENCOUNTER — Ambulatory Visit: Admitting: Cardiology

## 2024-11-23 ENCOUNTER — Telehealth: Payer: Self-pay | Admitting: Cardiology

## 2024-11-23 NOTE — Progress Notes (Signed)
 "  Cardiology Clinic Note   Date: 11/25/2024 ID: Dustin Lawrence, DOB Dec 09, 1945, MRN [id number]  [facility] Providers Cardiologist:  Redell Cave, MD Electrophysiologist:  OLE ONEIDA HOLTS, MD (Inactive)     Chief Complaint   Dustin Lawrence is a 79 y.o. male who presents to the clinic today for routine follow up.   Patient Profile   Dustin Lawrence is followed by Dr. Cave and Dr. Holts for the history outlined below.      Past medical history significant for: CAD. LHC 02/01/2022: Severe two-vessel CAD involving the LAD and dominant LCx.  90% proximal LAD and sequential 50% mid LAD and 90% mid to distal LAD.  90% distal LCx.  Tortuous right subclavian/innominate artery limiting catheter manipulation.  Transferred to [facility] for [facility] surgery consultation. CABG x 2 02/04/2022: LIMA to LAD, SVG to OM 2. VT. 14-day Zio 01/28/2022: HR 47 to 285 bpm, average 83 bpm.  Predominantly sinus rhythm.  31 runs of VT fastest lasting 8 minutes and 59 seconds with max rate to 85 bpm, longest 11 minutes 20 seconds average rate 195 bpm.  19 runs of SVT fastest lasting 7 beats max rate 174 bpm, longest 13 beats average 127 bpm.  Ventricular tachycardia was detected within +/- 45 seconds of symptomatic patient events.  Occasional isolated PVCs 2.3 %, couplets 1.3%. Echo 01/30/2022: EF 60 to 65%.  No RWMA.  Grade I DD.  Normal RV size/function.  Mild LAE.  Mild MR. 14-day Zio 03/18/2022: HR 43 to 158 bpm, average 67 bpm.  Predominantly sinus rhythm.  13 runs of SVT fastest 5 beats max rate 158 bpm, longest 14 beats average rate 102 bpm.  Occasional PVCs 3.5%.  No evidence of sustained VT or other significant arrhythmias noted. Cardiac MRI 03/21/2022: Normal LV/RV function, LVEF 52%.  Normal wall thickness.  No LGE or evidence of scar noted.  No evidence of myocarditis or infiltration.  No findings to suggest etiology of VT. Hypertension. Hyperlipidemia. Lipid panel 03/11/2024: LDL  51, HDL 45, TG 170, total 130. Hypothyroidism, Graves' disease.  In summary, patient was first evaluated by Dr. Cave on 01/03/2022 as a self-referral for palpitations.  He reported a month+ history of palpitations occurring at least 4 days a week.  He reported 2 weeks prior TSH levels were low indicating hyperthyroidism and Synthroid  was decreased.  Palpitations symptoms had improved and were occurring once a week.  He also reported a dry cough since lisinopril  was increased.  He was instructed to stop lisinopril  and start losartan .  Patient wore a 14-day ZIO which demonstrated symptomatic sustained runs of VT.  Patient was instructed to stop HCTZ and start Toprol  50 mg daily.  He underwent echo which demonstrated normal LV/RV function and no significant valvular abnormalities.  An urgent referral was placed to EP.  Patient was seen by Dr. Holts on 01/30/2022.  Patient was referred to endocrinology to help manage thyroid  dysregulation.  LHC was ordered and performed 2 days later demonstrating severe two-vessel CAD.  Patient was transferred to [facility] and ultimately underwent CABG x 2.  Upon clinic follow-up in April 2023 patient reported no further episodes of palpitations.  Repeat ZIO demonstrated SVT and PVCs.  He followed up with EP in June 2023 and reported lingering depression following his CABG.  He was referred to a psychologist.  It was recommended he continue Toprol  and he was instructed to follow-up with EP as needed.  Patient was last seen in the  office by Dr. Darliss on 10/03/2023 for routine follow-up.  His BP was elevated at his last visit and Toprol  was increased.  He reported home BP well-controlled.  He was working with endocrinology for his thyroid  dysfunction.  He had no cardiac complaints at the time of his visit.     History of Present Illness    Today, patient is accompanied by his wife. Patient denies orthopnea or PND. He has chronic dyspnea unchanged for years. 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 Dyspnea will worsen with exertion. He has chronic L>R lower extremity edema. No chest pain, pressure, or tightness. No palpitations. Wife reports he has poor sleep hygiene. He goes to bed at different times. He states he sometimes only sleeps for 3-4 hours then will nap during the day. He does not participate in regular exercise and is mostly sedentary throughout the day.     ROS: All other systems reviewed and are otherwise negative except as noted in History of Present Illness.  EKGs/Labs Reviewed    EKG Interpretation Date/Time:  Thursday November 25 2024 10:43:39 EST Ventricular Rate:  86 PR Interval:  190 QRS Duration:  74 QT Interval:  366 QTC Calculation: 437 R Axis:   -20  Text Interpretation: Normal sinus rhythm Inferior infarct , age undetermined When compared with ECG of 03-Oct-2023 11:26, No significant change was found Confirmed by Loistine Sober [phone]) on 11/25/2024 10:49:50 AM   07/22/2024: BUN 22; Creatinine, Ser 1.49; Potassium 4.3; Sodium 138   10/01/2024: Hemoglobin 17.6; WBC Count 8.1    Physical Exam    VS:  BP 120/70 (BP Location: Left Arm, Patient Position: Sitting, Cuff Size: Normal)   Pulse 86 Comment: 90 oximeter  Ht 5' 9 (1.753 m)   Wt 241 lb 12.8 oz (109.7 kg)   SpO2 98%   BMI 35.71 kg/m  , BMI Body mass index is 35.71 kg/m.  GEN: Well nourished, well developed, in no acute distress. Neck: No JVD or carotid bruits. Cardiac:  RRR.  No murmur. No rubs or gallops.   Respiratory:  Respirations regular and unlabored. Clear to auscultation without rales, wheezing or rhonchi. GI: Soft, nontender, nondistended. Extremities: Radials/DP/PT 2+ and equal bilaterally. No clubbing or cyanosis. Mild nonpitting edema L>R lower extremities.  Skin: Warm and dry, no rash. Neuro: Strength intact.  Assessment & Plan    CAD S/p CABG x 17 January 2022.  Patient denies chest pain, pressure or tightness. He has chronic dyspnea unchanged from previous. He does not participate in regular exercise and is mostly sedentary. Discussed the importance of increasing physical activity and improving diet.  - Increase physical activity as tolerated. Recommend seated exercises.  - Continue aspirin , atorvastatin , Toprol .  Ventricular tachycardia 14-day Zio March 2023 demonstrated 31 runs of symptomatic sustained VT.  Echo demonstrated normal LV/RV function with no significant valvular abnormalities.  Repeat ZIO following CABG did not demonstrate further episodes of sustained VT or other significant arrhythmias.  Cardiac MRI May 2023 with no LGE or evidence of scar, no evidence of myocarditis or infiltration.  Patient denies palpitations. EKG today shows no acute changes.  - Continue Toprol   Hypertension BP today 120/70. No report of headaches or dizziness.  - Continue losartan , Toprol , chlorthalidone .  Hyperlipidemia LDL 51 April 2025, at goal. - Continue atorvastatin .  Disposition: Return in 1 year or sooner as needed.  Signed, Barnie HERO. Zelta Enfield, DNP, NP-C  "

## 2024-11-23 NOTE — Telephone Encounter (Signed)
" °*  STAT* If patient is at the pharmacy, call can be transferred to refill team.   1. Which medications need to be refilled? (please list name of each medication and dose if known)  metoprolol  succinate (TOPROL -XL) 50 MG 24 hr tablet   2. Would you like to learn more about the convenience, safety, & potential cost savings by using the [facility]? no   3. Are you open to using the [facility] (Type [facility]. no   4. Which pharmacy/location (including street and city if local pharmacy) is medication to be sent to?   [facility] [address] [place] - [address]    5. Do they need a 30 day or 90 day supply? 30 days   Pt is out.  "

## 2024-11-24 NOTE — Progress Notes (Signed)
 FARRELL PANTALEO                                          MRN: [id number]   11/24/2024   The VBCI Quality Team Specialist reviewed this patient medical record for the purposes of chart review for care gap closure. The following were reviewed: chart review for care gap closure-kidney health evaluation for diabetes:eGFR  and uACR.    VBCI Quality Team

## 2024-11-25 ENCOUNTER — Encounter: Payer: Self-pay | Admitting: Student

## 2024-11-25 ENCOUNTER — Ambulatory Visit: Attending: Student | Admitting: Student

## 2024-11-25 VITALS — BP 120/70 | HR 86 | Ht 69.0 in | Wt 241.8 lb

## 2024-11-25 DIAGNOSIS — I25810 Atherosclerosis of coronary artery bypass graft(s) without angina pectoris: Secondary | ICD-10-CM | POA: Diagnosis not present

## 2024-11-25 DIAGNOSIS — I10 Essential (primary) hypertension: Secondary | ICD-10-CM

## 2024-11-25 DIAGNOSIS — Z951 Presence of aortocoronary bypass graft: Secondary | ICD-10-CM

## 2024-11-25 DIAGNOSIS — I472 Ventricular tachycardia, unspecified: Secondary | ICD-10-CM | POA: Diagnosis not present

## 2024-11-25 DIAGNOSIS — E785 Hyperlipidemia, unspecified: Secondary | ICD-10-CM

## 2024-11-25 MED ORDER — METOPROLOL SUCCINATE ER 50 MG PO TB24: 50.0000 mg | ORAL_TABLET | Freq: Every day | ORAL | 3 refills | Status: AC

## 2024-11-25 MED ORDER — METOPROLOL SUCCINATE ER 50 MG PO TB24: ORAL_TABLET | ORAL | 3 refills | Status: DC

## 2024-11-25 NOTE — Patient Instructions (Addendum)
 Medication Instructions:   Your physician recommends the following medication changes.  DECREASE: Metoprolol  Succinate (Toprol  XL) 50 mg once daily with meals  *If you need a refill on your cardiac medications before your next appointment, please call your pharmacy*  Lab Work:  None ordered at this time   If you have labs (blood work) drawn today and your tests are completely normal, you will receive your results only by:  MyChart Message (if you have MyChart) OR  A paper copy in the mail If you have any lab test that is abnormal or we need to change your treatment, we will call you to review the results.  Testing/Procedures:  None ordered at this time   Referrals:  None ordered at this time   Follow-Up:  At [facility], you and your health needs are our priority.  As part of our continuing mission to provide you with exceptional heart care, our providers are all part of one team.  This team includes your primary Cardiologist (physician) and Advanced Practice Providers or APPs (Physician Assistants and Nurse Practitioners) who all work together to provide you with the care you need, when you need it.  Your next appointment:   1 year(s)  Provider:    Redell Cave, MD or Barnie Hila, NP    We recommend signing up for the patient portal called MyChart.  Sign up information is provided on this After Visit Summary.  MyChart is used to connect with patients for Virtual Visits (Telemedicine).  Patients are able to view lab/test results, encounter notes, upcoming appointments, etc.  Non-urgent messages can be sent to your provider as well.   To learn more about what you can do with MyChart, go to [email or web address].

## 2024-11-25 NOTE — Telephone Encounter (Signed)
 Metoprolol  50 mg refilled today at office visit.

## 2024-11-30 ENCOUNTER — Other Ambulatory Visit: Payer: Self-pay | Admitting: *Deleted

## 2024-11-30 DIAGNOSIS — D751 Secondary polycythemia: Secondary | ICD-10-CM

## 2024-12-01 ENCOUNTER — Inpatient Hospital Stay

## 2024-12-01 ENCOUNTER — Inpatient Hospital Stay: Attending: Oncology

## 2024-12-01 DIAGNOSIS — D751 Secondary polycythemia: Secondary | ICD-10-CM | POA: Insufficient documentation

## 2024-12-01 LAB — CBC WITH DIFFERENTIAL/PLATELET
Abs Immature Granulocytes: 0.03 K/uL (ref 0.00–0.07)
Basophils Absolute: 0 K/uL (ref 0.0–0.1)
Basophils Relative: 1 %
Eosinophils Absolute: 0.3 K/uL (ref 0.0–0.5)
Eosinophils Relative: 4 %
HCT: 48.7 % (ref 39.0–52.0)
Hemoglobin: 16.1 g/dL (ref 13.0–17.0)
Immature Granulocytes: 0 %
Lymphocytes Relative: 17 %
Lymphs Abs: 1.3 K/uL (ref 0.7–4.0)
MCH: 29.5 pg (ref 26.0–34.0)
MCHC: 33.1 g/dL (ref 30.0–36.0)
MCV: 89.4 fL (ref 80.0–100.0)
Monocytes Absolute: 0.8 K/uL (ref 0.1–1.0)
Monocytes Relative: 11 %
Neutro Abs: 5 K/uL (ref 1.7–7.7)
Neutrophils Relative %: 67 %
Platelets: 237 K/uL (ref 150–400)
RBC: 5.45 MIL/uL (ref 4.22–5.81)
RDW: 15.1 % (ref 11.5–15.5)
WBC: 7.4 K/uL (ref 4.0–10.5)
nRBC: 0 % (ref 0.0–0.2)

## 2024-12-01 NOTE — Progress Notes (Signed)
 No phlebotomy today hemoglobin 16.1 which is below parameter of 17.

## 2024-12-03 ENCOUNTER — Other Ambulatory Visit: Payer: Self-pay

## 2024-12-03 NOTE — Progress Notes (Addendum)
 Pharmacy Quality Measure Review  This patient is appearing on a report for being at risk of failing the adherence measure for cholesterol (statin) medications this calendar year.   Medication: atorvastatin  80 mg Last fill date: 09/28/24 for 90 day supply  Insurance report was not up to date. No action needed at this time.  Medication has been refilled on 09/28/24 for 90 day supply and no refills.  [name], PharmD PGY1 Health-System Pharmacy Administration and Leadership Resident [facility]  12/03/2024 10:24 AM

## 2025-01-31 ENCOUNTER — Inpatient Hospital Stay

## 2025-03-07 ENCOUNTER — Other Ambulatory Visit

## 2025-03-14 ENCOUNTER — Ambulatory Visit: Admitting: Urology

## 2025-03-31 ENCOUNTER — Inpatient Hospital Stay: Admitting: Nurse Practitioner

## 2025-03-31 ENCOUNTER — Inpatient Hospital Stay
